# Patient Record
Sex: Female | Born: 1950 | Race: White | Hispanic: No | Marital: Married | Smoking: Former smoker
Health system: Southern US, Community
[De-identification: ages and names within clinical notes are randomized; demographics above are authoritative.]

## PROBLEM LIST (undated history)

## (undated) DIAGNOSIS — E785 Hyperlipidemia, unspecified: Secondary | ICD-10-CM

## (undated) DIAGNOSIS — K219 Gastro-esophageal reflux disease without esophagitis: Secondary | ICD-10-CM

## (undated) DIAGNOSIS — K8020 Calculus of gallbladder without cholecystitis without obstruction: Secondary | ICD-10-CM

## (undated) DIAGNOSIS — T7840XA Allergy, unspecified, initial encounter: Secondary | ICD-10-CM

## (undated) DIAGNOSIS — K269 Duodenal ulcer, unspecified as acute or chronic, without hemorrhage or perforation: Secondary | ICD-10-CM

## (undated) DIAGNOSIS — N200 Calculus of kidney: Secondary | ICD-10-CM

## (undated) DIAGNOSIS — J45909 Unspecified asthma, uncomplicated: Secondary | ICD-10-CM

## (undated) DIAGNOSIS — M8580 Other specified disorders of bone density and structure, unspecified site: Secondary | ICD-10-CM

## (undated) DIAGNOSIS — K589 Irritable bowel syndrome without diarrhea: Secondary | ICD-10-CM

## (undated) DIAGNOSIS — M519 Unspecified thoracic, thoracolumbar and lumbosacral intervertebral disc disorder: Secondary | ICD-10-CM

## (undated) DIAGNOSIS — I10 Essential (primary) hypertension: Secondary | ICD-10-CM

## (undated) DIAGNOSIS — M1990 Unspecified osteoarthritis, unspecified site: Secondary | ICD-10-CM

## (undated) HISTORY — DX: Unspecified osteoarthritis, unspecified site: M19.90

## (undated) HISTORY — PX: BREAST SURGERY: SHX581

## (undated) HISTORY — DX: Gastro-esophageal reflux disease without esophagitis: K21.9

## (undated) HISTORY — DX: Irritable bowel syndrome, unspecified: K58.9

## (undated) HISTORY — DX: Calculus of gallbladder without cholecystitis without obstruction: K80.20

## (undated) HISTORY — DX: Duodenal ulcer, unspecified as acute or chronic, without hemorrhage or perforation: K26.9

## (undated) HISTORY — DX: Calculus of kidney: N20.0

## (undated) HISTORY — PX: TUBAL LIGATION: SHX77

## (undated) HISTORY — PX: UPPER GASTROINTESTINAL ENDOSCOPY: SHX188

## (undated) HISTORY — DX: Other specified disorders of bone density and structure, unspecified site: M85.80

## (undated) HISTORY — DX: Essential (primary) hypertension: I10

## (undated) HISTORY — PX: BREAST CYST ASPIRATION: SHX578

## (undated) HISTORY — DX: Allergy, unspecified, initial encounter: T78.40XA

## (undated) HISTORY — DX: Hyperlipidemia, unspecified: E78.5

## (undated) HISTORY — DX: Unspecified thoracic, thoracolumbar and lumbosacral intervertebral disc disorder: M51.9

## (undated) HISTORY — DX: Unspecified asthma, uncomplicated: J45.909

---

## 1998-01-13 HISTORY — PX: LAPAROSCOPIC CHOLECYSTECTOMY: SUR755

## 1998-11-12 ENCOUNTER — Encounter: Payer: Self-pay | Admitting: Nurse Practitioner

## 1998-11-14 ENCOUNTER — Encounter: Payer: Self-pay | Admitting: Nurse Practitioner

## 2009-10-30 ENCOUNTER — Emergency Department (HOSPITAL_COMMUNITY): Admission: EM | Admit: 2009-10-30 | Discharge: 2009-10-30 | Payer: Self-pay | Admitting: Emergency Medicine

## 2011-03-10 ENCOUNTER — Encounter (HOSPITAL_COMMUNITY): Payer: Self-pay | Admitting: *Deleted

## 2011-03-10 ENCOUNTER — Emergency Department (HOSPITAL_COMMUNITY)
Admission: EM | Admit: 2011-03-10 | Discharge: 2011-03-10 | Disposition: A | Discharge status: Home / Self Care | Payer: PRIVATE HEALTH INSURANCE | Attending: Emergency Medicine | Admitting: Emergency Medicine

## 2011-03-10 DIAGNOSIS — L570 Actinic keratosis: Secondary | ICD-10-CM | POA: Insufficient documentation

## 2011-03-10 NOTE — ED Notes (Signed)
Pt states that she has a skin lesion to scalp for 3 weeks and seems to be spreading per pt, insurance company told pt to be seen at ER

## 2011-03-10 NOTE — ED Notes (Signed)
Alert, NAd, pt has small skin lesion to scalp, present for 2 weeks, [place], and advised by insurance co to go to ER,

## 2011-03-12 NOTE — ED Provider Notes (Signed)
History     CSN: [id number]  Arrival date & time 03/10/11  1112   First MD Initiated Contact with Patient 03/10/11 1339      Chief Complaint  Patient presents with  . Wound Check    (Consider location/radiation/quality/duration/timing/severity/associated sxs/prior treatment) HPI Comments: Patient presents  for evaluation of the lesion that she first noticed on her scalp approximately 3 weeks ago.  Lesion is not painful, itchy or draining, but has increased in size since she first noticed this.  It is slightly raised and scaling.  She is otherwise without complaints.  She is visiting from [place], but maybe here permanently to care for an invalid relative.  She wants reassurance that the lesion is not a skin cancer, as she has had significant sun exposure through the years.  She is otherwise without complaint today.  She has tried no medications for this lesion.  She has no significant past medical history and is not currently on any medications.  The history is provided by the patient.    History reviewed. No pertinent past medical history.  Past Surgical History  Procedure Date  . Cesarean section   . Laparoscopic cholecystectomy     History reviewed. No pertinent family history.  History  Substance Use Topics  . Smoking status: Former Games developer  . Smokeless tobacco: Not on file  . Alcohol Use: No    OB History    Grav Para Term Preterm Abortions TAB SAB Ect Mult Living                  Review of Systems  Constitutional: Negative for fever.  HENT: Negative for congestion, sore throat and neck pain.   Eyes: Negative.   Respiratory: Negative for chest tightness and shortness of breath.   Cardiovascular: Negative for chest pain.  Gastrointestinal: Negative for nausea and abdominal pain.  Genitourinary: Negative.   Musculoskeletal: Negative for joint swelling and arthralgias.  Skin: Negative for color change, rash and wound.  Neurological: Negative for dizziness,  weakness, light-headedness, numbness and headaches.  Hematological: Negative.   Psychiatric/Behavioral: Negative.     Allergies  Review of patient's allergies indicates no known allergies.  Home Medications  No current outpatient prescriptions on file.  BP 108/74  Pulse 68  Temp(Src) 97.8 F (36.6 C) (Oral)  Resp 18  Ht 5\' 1"  (1.549 m)  Wt 120 lb (54.432 kg)  BMI 22.67 kg/m2  SpO2 100%  Physical Exam  Nursing note and vitals reviewed. Constitutional: She is oriented to person, place, and time. She appears well-developed and well-nourished.  HENT:  Head: Normocephalic and atraumatic.  Eyes: Conjunctivae are normal.  Neck: Normal range of motion.  Cardiovascular: Normal rate.   Pulmonary/Chest: Effort normal.  Musculoskeletal: Normal range of motion.  Lymphadenopathy:       Head (right side): No submandibular, no preauricular, no posterior auricular and no occipital adenopathy present.       Head (left side): No submandibular, no preauricular, no posterior auricular and no occipital adenopathy present.    She has no cervical adenopathy.  Neurological: She is alert and oriented to person, place, and time.  Skin: Skin is warm and dry.       1 cm plaque like lesion right parietal scalp, slightly raised in skin color with uniformity of color, scaly, no surrounding erythema, and no drainage and fairly regular lesion margins.  Psychiatric: She has a normal mood and affect.    ED Course  Procedures (including critical care time)  Labs Reviewed - No data to display No results found.   1. Actinic keratosis       MDM  Lesion most consistent with actinic keratosis.  However, patient advised that this is a condition that can transition to a skin cancer and she does need to have this biopsied and/or definitively treated with excision.  She has been referred to Dr. Margo Aye home she will call today to set up an appointment.        Candis Musa, PA 03/12/11 1113  Candis Musa, PA 03/12/11 1114

## 2011-03-14 NOTE — ED Provider Notes (Signed)
Medical screening examination/treatment/procedure(s) were performed by non-physician practitioner and as supervising physician I was immediately available for consultation/collaboration.  Nicoletta Dress. Colon Branch, MD 03/14/11 [phone]

## 2011-07-14 HISTORY — PX: SKIN LESION EXCISION: SHX2412

## 2011-12-25 ENCOUNTER — Emergency Department (HOSPITAL_COMMUNITY)
Admission: EM | Admit: 2011-12-25 | Discharge: 2011-12-25 | Disposition: A | Discharge status: Home / Self Care | Attending: Emergency Medicine | Admitting: Emergency Medicine

## 2011-12-25 ENCOUNTER — Encounter (HOSPITAL_COMMUNITY): Payer: Self-pay | Admitting: Emergency Medicine

## 2011-12-25 DIAGNOSIS — N63 Unspecified lump in unspecified breast: Secondary | ICD-10-CM | POA: Insufficient documentation

## 2011-12-25 DIAGNOSIS — R928 Other abnormal and inconclusive findings on diagnostic imaging of breast: Secondary | ICD-10-CM | POA: Insufficient documentation

## 2011-12-25 DIAGNOSIS — Z87891 Personal history of nicotine dependence: Secondary | ICD-10-CM | POA: Insufficient documentation

## 2011-12-25 NOTE — ED Notes (Signed)
Pt states noticed lump in right breast one week ago. Pt states slightly painful and hard.

## 2011-12-28 NOTE — ED Provider Notes (Signed)
History     CSN: [id number]  Arrival date & time 12/25/11  1308   First MD Initiated Contact with Patient 12/25/11 1513      Chief Complaint  Patient presents with  . Breast Mass    (Consider location/radiation/quality/duration/timing/severity/associated sxs/prior treatment) HPI Comments: Denise Benton presents for evaluation of a breast mass she noticed about 1 week ago. She reports having a history of cystic breast masses in the past, with several episodes requiring needle aspiration.  Her pcp is in [place], where they spend the summers,  Here for the winter months.  After speaking with her pcp,  She was advised to obtain an ultrasound given her past history.  The nodule is tender to palpation but is otherwise asymptomatic.    The history is provided by the patient.    History reviewed. No pertinent past medical history.  Past Surgical History  Procedure Date  . Cesarean section   . Laparoscopic cholecystectomy   . Breast surgery     pt has cysts biopsy done for lumps    History reviewed. No pertinent family history.  History  Substance Use Topics  . Smoking status: Former Games developer  . Smokeless tobacco: Not on file  . Alcohol Use: No    OB History    Grav Para Term Preterm Abortions TAB SAB Ect Mult Living                  Review of Systems  Constitutional: Negative for fever and chills.  HENT: Negative for congestion, sore throat and neck pain.   Eyes: Negative.   Respiratory: Negative for chest tightness and shortness of breath.   Cardiovascular: Negative for chest pain.  Gastrointestinal: Negative for nausea and abdominal pain.  Genitourinary: Negative.   Musculoskeletal: Negative for joint swelling and arthralgias.  Skin: Negative.  Negative for color change, rash and wound.  Neurological: Negative for dizziness, weakness, light-headedness, numbness and headaches.  Hematological: Negative.   Psychiatric/Behavioral: Negative.     Allergies   Codeine; Ivp dye; and Niaspan  Home Medications  No current outpatient prescriptions on file.  BP 113/68  Pulse 60  Temp 98 F (36.7 C) (Oral)  Resp 20  Ht 5\' 1"  (1.549 m)  Wt 119 lb (53.978 kg)  BMI 22.48 kg/m2  SpO2 100%  Physical Exam  Nursing note and vitals reviewed. Constitutional: She appears well-developed and well-nourished.  HENT:  Head: Normocephalic and atraumatic.  Eyes: Conjunctivae normal are normal.  Neck: Normal range of motion.  Cardiovascular: Normal rate, regular rhythm, normal heart sounds and intact distal pulses.   Pulmonary/Chest: Effort normal and breath sounds normal. She has no wheezes.       Small, firm deep, moveable nodule at 3 oclock position.  No breast asymmetry, no nipple drainage, no skin abnormalities.  No axillary adenopathy.  Abdominal: Soft. Bowel sounds are normal. There is no tenderness.  Musculoskeletal: Normal range of motion.  Neurological: She is alert.  Skin: Skin is warm and dry. No rash noted. No erythema.  Psychiatric: She has a normal mood and affect.    ED Course  Procedures (including critical care time)  Labs Reviewed - No data to display No results found.   1. Mass   2. Breast nodule       MDM  Outpatient mammogram with diagnostic US arranged  In 6 days, at which time she will have consult with interventional radiologist on site.  Further management per results of this study.  Burgess Amor, [place] 12/28/11 [phone]

## 2011-12-28 NOTE — ED Provider Notes (Signed)
Medical screening examination/treatment/procedure(s) were performed by non-physician practitioner and as supervising physician I was immediately available for consultation/collaboration. Devoria Albe, MD, FACEP   Ward Givens, MD 12/28/11 [phone]

## 2011-12-31 ENCOUNTER — Encounter (HOSPITAL_COMMUNITY): Payer: PRIVATE HEALTH INSURANCE

## 2012-01-01 ENCOUNTER — Ambulatory Visit (HOSPITAL_COMMUNITY)
Admission: RE | Admit: 2012-01-01 | Discharge: 2012-01-01 | Disposition: A | Discharge status: Home / Self Care | Payer: PRIVATE HEALTH INSURANCE | Source: Ambulatory Visit | Attending: Emergency Medicine | Admitting: Emergency Medicine

## 2012-01-01 ENCOUNTER — Other Ambulatory Visit (HOSPITAL_COMMUNITY): Payer: Self-pay | Admitting: Emergency Medicine

## 2012-01-01 DIAGNOSIS — N63 Unspecified lump in unspecified breast: Secondary | ICD-10-CM | POA: Insufficient documentation

## 2012-01-01 DIAGNOSIS — N644 Mastodynia: Secondary | ICD-10-CM | POA: Insufficient documentation

## 2019-01-27 ENCOUNTER — Other Ambulatory Visit: Payer: Self-pay

## 2019-01-28 ENCOUNTER — Telehealth: Payer: Self-pay | Admitting: Family Medicine

## 2019-01-28 ENCOUNTER — Ambulatory Visit (INDEPENDENT_AMBULATORY_CARE_PROVIDER_SITE_OTHER): Payer: Medicare Other

## 2019-01-28 ENCOUNTER — Ambulatory Visit (INDEPENDENT_AMBULATORY_CARE_PROVIDER_SITE_OTHER): Payer: Medicare Other | Admitting: Family Medicine

## 2019-01-28 ENCOUNTER — Encounter: Payer: Self-pay | Admitting: Family Medicine

## 2019-01-28 VITALS — BP 118/70 | HR 56 | Temp 96.8°F | Resp 20 | Ht 61.0 in | Wt 112.0 lb

## 2019-01-28 DIAGNOSIS — Z0000 Encounter for general adult medical examination without abnormal findings: Secondary | ICD-10-CM

## 2019-01-28 DIAGNOSIS — J4520 Mild intermittent asthma, uncomplicated: Secondary | ICD-10-CM

## 2019-01-28 DIAGNOSIS — Z780 Asymptomatic menopausal state: Secondary | ICD-10-CM

## 2019-01-28 DIAGNOSIS — M8580 Other specified disorders of bone density and structure, unspecified site: Secondary | ICD-10-CM

## 2019-01-28 DIAGNOSIS — K219 Gastro-esophageal reflux disease without esophagitis: Secondary | ICD-10-CM | POA: Diagnosis not present

## 2019-01-28 NOTE — Patient Instructions (Addendum)
Check on tetanus administration date.    Food Choices for Gastroesophageal Reflux Disease, Adult When you have gastroesophageal reflux disease (GERD), the foods you eat and your eating habits are very important. Choosing the right foods can help ease your discomfort. Think about working with a nutrition specialist (dietitian) to help you make good choices. What are tips for following this plan?  Meals  Choose healthy foods that are low in fat, such as fruits, vegetables, whole grains, low-fat dairy products, and lean meat, fish, and poultry.  Eat small meals often instead of 3 large meals a day. Eat your meals slowly, and in a place where you are relaxed. Avoid bending over or lying down until 2-3 hours after eating.  Avoid eating meals 2-3 hours before bed.  Avoid drinking a lot of liquid with meals.  Cook foods using methods other than frying. Bake, grill, or broil food instead.  Avoid or limit: ? Chocolate. ? Peppermint or spearmint. ? Alcohol. ? Pepper. ? Black and decaffeinated coffee. ? Black and decaffeinated tea. ? Bubbly (carbonated) soft drinks. ? Caffeinated energy drinks and soft drinks.  Limit high-fat foods such as: ? Fatty meat or fried foods. ? Whole milk, cream, butter, or ice cream. ? Nuts and nut butters. ? Pastries, donuts, and sweets made with butter or shortening.  Avoid foods that cause symptoms. These foods may be different for everyone. Common foods that cause symptoms include: ? Tomatoes. ? Oranges, lemons, and limes. ? Peppers. ? Spicy food. ? Onions and garlic. ? Vinegar. Lifestyle  Maintain a healthy weight. Ask your doctor what weight is healthy for you. If you need to lose weight, work with your doctor to do so safely.  Exercise for at least 30 minutes for 5 or more days each week, or as told by your doctor.  Wear loose-fitting clothes.  Do not smoke. If you need help quitting, ask your doctor.  Sleep with the head of your bed higher  than your feet. Use a wedge under the mattress or blocks under the bed frame to raise the head of the bed. Summary  When you have gastroesophageal reflux disease (GERD), food and lifestyle choices are very important in easing your symptoms.  Eat small meals often instead of 3 large meals a day. Eat your meals slowly, and in a place where you are relaxed.  Limit high-fat foods such as fatty meat or fried foods.  Avoid bending over or lying down until 2-3 hours after eating.  Avoid peppermint and spearmint, caffeine, alcohol, and chocolate. This information is not intended to replace advice given to you by your health care provider. Make sure you discuss any questions you have with your health care provider. Document Revised: 04/22/2018 Document Reviewed: 02/05/2016 Elsevier Patient Education  2020 [employer].

## 2019-01-28 NOTE — Telephone Encounter (Signed)
Patient returning missed call from [facility] regarding Dexa Scan results.

## 2019-01-28 NOTE — Progress Notes (Signed)
New Patient Office Visit  Assessment & Plan:  1. Mild intermittent asthma without complication - Well controlled on current regimen. Uses Advair during her season of increased exacerbations to prevent them.  2. Gastroesophageal reflux disease, unspecified whether esophagitis present - Patient is going to decrease Prilosec from 40 mg to 20 mg once daily after she returns home from [place] where she is going next week to help her mother. She will then try to switch over to a H2 blocker in order to get off the PPI. She does not wish to be on this long term. Education provided on appropriate food choices with GERD. Chocolate is her weakness when it comes to this.   3-4. Postmenopausal estrogen deficiency/Osteopenia, unspecified location - Patient is taking calcium and vitamin D supplements.  - DG [facility] DEXA  5. Healthcare maintenance - Patient believes she is UTD with pap smear, mammogram, colonoscopy, and TDAP - all of these should be in her records. Hopefully hepatitis C screening is in these records as well.    Follow-up: Return in about 6 months (around 07/28/2019) for follow-up of chronic medication conditions.   [name], MSN, APRN, FNP-C [name]  Subjective:  Patient ID: Denise Benton, female    DOB: 1950-10-12  Age: 69 y.o. MRN: [id number]  Patient Care Team: Loman Brooklyn, FNP as PCP - General (Family Medicine)  CC:  Chief Complaint  Patient presents with  . Establish Care    HPI Denise Benton presents to establish care. She is transferring from [place] due to a recent move to be closer to her husband's elderly parents.   Asthma: patient does not require use of Albuterol inhaler unless she is exercising an asthma exacerbation which is not very often.   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 She was initially started on Prilosec 40 mg BID which she took for a month before she was able to sleep through the night. She is trying to wean herself off of it as she knows how difficult this can be. She is currently taking Prilosec 40 mg in the morning and Mylanta at night. She has never tried any of the H2 blockers. She lost ~10 lbs when she was first started on the Prilosec. She feels she is losing muscle mass since being on it as well.   Patient reports having an annual physical with labs in October 2020 before leaving [place].    Review of Systems  Constitutional: Negative for chills, fever, malaise/fatigue and weight loss.  HENT: Negative for congestion, ear discharge, ear pain, nosebleeds, sinus pain, sore throat and tinnitus.   Eyes: Negative for blurred vision, double vision, pain, discharge and redness.  Respiratory: Negative for cough, shortness of breath and wheezing.   Cardiovascular: Negative for chest pain, palpitations and leg swelling.  Gastrointestinal: Positive for heartburn. Negative for abdominal pain, constipation, diarrhea, nausea and vomiting.  Genitourinary: Negative for dysuria, frequency and urgency.  Musculoskeletal: Negative for myalgias.  Skin: Negative for rash.  Neurological: Negative for dizziness, seizures, weakness and headaches.  Psychiatric/Behavioral: Negative for depression, substance abuse and suicidal ideas. The patient is not nervous/anxious.     Current Outpatient Medications:  .  albuterol (VENTOLIN HFA) 108 (90 Base) MCG/ACT inhaler, Inhale into the lungs., Disp: , Rfl:  .  aspirin EC 81 MG tablet, Take 81  mg by mouth once a week., Disp: , Rfl:  .  B Complex Vitamins (VITAMIN-B COMPLEX PO), Take 1 tablet by mouth daily., Disp: , Rfl:  .  Biotin 67619 MCG TABS, Take by mouth daily., Disp: , Rfl:  .  calcium carbonate (CALTRATE 600) 1500 (600 Ca) MG TABS tablet, Take by mouth 3 (three) times a week., Disp: , Rfl:    .  Cholecalciferol (VITAMIN D) 50 MCG (2000 UT) CAPS, Take by mouth 3 (three) times a week., Disp: , Rfl:  .  fluticasone-salmeterol (ADVAIR HFA) 115-21 MCG/ACT inhaler, , Disp: , Rfl:  .  folic acid (FOLVITE) 800 MCG tablet, Take 400 mcg by mouth 3 (three) times a week., Disp: , Rfl:  .  Multiple Vitamin (MULTIVITAMIN ADULT PO), Take by mouth., Disp: , Rfl:  .  omeprazole (PRILOSEC) 40 MG capsule, Take 1 capsule by mouth daily., Disp: , Rfl:   Allergies  Allergen Reactions  . Codeine   . Ivp Dye [Iodinated Diagnostic Agents]   . Niaspan Odette Fraction Er]     Past Medical History:  Diagnosis Date  . Allergy   . Asthma   . GERD (gastroesophageal reflux disease)   . Osteopenia     Past Surgical History:  Procedure Laterality Date  . BREAST SURGERY     pt has cysts aspiration done for lumps  . CESAREAN SECTION    . LAPAROSCOPIC CHOLECYSTECTOMY      Family History  Problem Relation Age of Onset  . Tremor Mother   . Hyperlipidemia Mother   . Skin cancer Mother   . Congestive Heart Failure Father   . Diabetes Father   . Multiple sclerosis Sister   . Heart disease Sister   . Tremor Sister     Social History   Socioeconomic History  . Marital status: Married    Spouse name: Not on file  . Number of children: 2  . Years of education: Not on file  . Highest education level: Not on file  Occupational History  . Not on file  Tobacco Use  . Smoking status: Former Games developer  . Smokeless tobacco: Never Used  Substance and Sexual Activity  . Alcohol use: No  . Drug use: No  . Sexual activity: Not on file  Other Topics Concern  . Not on file  Social History Narrative  . Not on file   Social Determinants of Health   Financial Resource Strain:   . Difficulty of Paying Living Expenses: Not on file  Food Insecurity:   . Worried About Programme researcher, broadcasting/film/video in the Last Year: Not on file  . Ran Out of Food in the Last Year: Not on file  Transportation Needs:   . Lack of  Transportation (Medical): Not on file  . Lack of Transportation (Non-Medical): Not on file  Physical Activity:   . Days of Exercise per Week: Not on file  . Minutes of Exercise per Session: Not on file  Stress:   . Feeling of Stress : Not on file  Social Connections:   . Frequency of Communication with Friends and Family: Not on file  . Frequency of Social Gatherings with Friends and Family: Not on file  . Attends Religious Services: Not on file  . Active Member of Clubs or Organizations: Not on file  . Attends Banker Meetings: Not on file  . Marital Status: Not on file  Intimate Partner Violence:   . Fear of Current or Ex-Partner: Not on file  .  Emotionally Abused: Not on file  . Physically Abused: Not on file  . Sexually Abused: Not on file    Objective:   Today's Vitals: BP 118/70   Pulse (!) 56   Temp (!) 96.8 F (36 C)   Resp 20   Ht 5\' 1"  (1.549 m)   Wt 112 lb (50.8 kg)   SpO2 100%   BMI 21.16 kg/m   Physical Exam Vitals reviewed.  Constitutional:      General: She is not in acute distress.    Appearance: Normal appearance. She is normal weight. She is not ill-appearing, toxic-appearing or diaphoretic.  HENT:     Head: Normocephalic and atraumatic.  Eyes:     General: No scleral icterus.       Right eye: No discharge.        Left eye: No discharge.     Conjunctiva/sclera: Conjunctivae normal.  Cardiovascular:     Rate and Rhythm: Normal rate and regular rhythm.     Heart sounds: Normal heart sounds. No murmur. No friction rub. No gallop.   Pulmonary:     Effort: Pulmonary effort is normal. No respiratory distress.     Breath sounds: Normal breath sounds. No stridor. No wheezing, rhonchi or rales.  Musculoskeletal:        General: Normal range of motion.     Cervical back: Normal range of motion.  Skin:    General: Skin is warm and dry.     Capillary Refill: Capillary refill takes less than 2 seconds.  Neurological:     General: No focal  deficit present.     Mental Status: She is alert and oriented to person, place, and time. Mental status is at baseline.  Psychiatric:        Mood and Affect: Mood normal.        Behavior: Behavior normal.        Thought Content: Thought content normal.        Judgment: Judgment normal.

## 2019-01-29 ENCOUNTER — Encounter: Payer: Self-pay | Admitting: Family Medicine

## 2019-01-29 DIAGNOSIS — M8580 Other specified disorders of bone density and structure, unspecified site: Secondary | ICD-10-CM | POA: Insufficient documentation

## 2019-01-29 DIAGNOSIS — K219 Gastro-esophageal reflux disease without esophagitis: Secondary | ICD-10-CM | POA: Insufficient documentation

## 2019-01-29 DIAGNOSIS — J45909 Unspecified asthma, uncomplicated: Secondary | ICD-10-CM | POA: Insufficient documentation

## 2019-01-31 NOTE — Progress Notes (Signed)
Patient states that she will bring in a copy of mammogram and colonoscopy.  Aware that she needs TDAP and patient will follow up on this.

## 2019-02-01 NOTE — Telephone Encounter (Signed)
Reviewed DEXA with pt per provider notes.

## 2019-02-25 ENCOUNTER — Ambulatory Visit: Payer: PRIVATE HEALTH INSURANCE | Attending: Internal Medicine

## 2019-02-25 ENCOUNTER — Other Ambulatory Visit: Payer: Self-pay

## 2019-02-25 DIAGNOSIS — Z20822 Contact with and (suspected) exposure to covid-19: Secondary | ICD-10-CM

## 2019-02-26 LAB — NOVEL CORONAVIRUS, NAA: SARS-CoV-2, NAA: NOT DETECTED

## 2019-03-14 ENCOUNTER — Other Ambulatory Visit: Payer: Self-pay

## 2019-03-15 ENCOUNTER — Encounter: Payer: Self-pay | Admitting: Nurse Practitioner

## 2019-03-15 ENCOUNTER — Encounter: Payer: Self-pay | Admitting: Family Medicine

## 2019-03-15 ENCOUNTER — Ambulatory Visit (INDEPENDENT_AMBULATORY_CARE_PROVIDER_SITE_OTHER): Payer: Medicare Other | Admitting: Family Medicine

## 2019-03-15 VITALS — BP 110/66 | HR 69 | Temp 97.8°F | Ht 61.0 in | Wt 113.2 lb

## 2019-03-15 DIAGNOSIS — R350 Frequency of micturition: Secondary | ICD-10-CM | POA: Diagnosis not present

## 2019-03-15 DIAGNOSIS — K219 Gastro-esophageal reflux disease without esophagitis: Secondary | ICD-10-CM

## 2019-03-15 DIAGNOSIS — R8290 Unspecified abnormal findings in urine: Secondary | ICD-10-CM | POA: Diagnosis not present

## 2019-03-15 DIAGNOSIS — N200 Calculus of kidney: Secondary | ICD-10-CM | POA: Diagnosis not present

## 2019-03-15 LAB — URINALYSIS
Bilirubin, UA: NEGATIVE
Glucose, UA: NEGATIVE
Ketones, UA: NEGATIVE
Leukocytes,UA: NEGATIVE
Nitrite, UA: NEGATIVE
Protein,UA: NEGATIVE
Specific Gravity, UA: 1.025 (ref 1.005–1.030)
Urobilinogen, Ur: 0.2 mg/dL (ref 0.2–1.0)
pH, UA: 7 (ref 5.0–7.5)

## 2019-03-15 NOTE — Patient Instructions (Addendum)
Famotidine for reflux.   Kidney Stones Kidney stones are rock-like masses that form inside of the kidneys. Kidneys are organs that make pee (urine). A kidney stone may move into other parts of the urinary tract, including:  The tubes that connect the kidneys to the bladder (ureters).  The bladder.  The tube that carries urine out of the body (urethra). Kidney stones can cause very bad pain and can block the flow of pee. The stone usually leaves your body (passes) through your pee. You may need to have a doctor take out the stone. What are the causes? Kidney stones may be caused by:  A condition in which certain glands make too much parathyroid hormone (primary hyperparathyroidism).  A buildup of a type of crystals in the bladder made of a chemical called uric acid. The body makes uric acid when you eat certain foods.  Narrowing (stricture) of one or both of the ureters.  A kidney blockage that you were born with.  Past surgery on the kidney or the ureters, such as gastric bypass surgery. What increases the risk? You are more likely to develop this condition if:  You have had a kidney stone in the past.  You have a family history of kidney stones.  You do not drink enough water.  You eat a diet that is high in protein, salt (sodium), or sugar.  You are overweight or very overweight (obese). What are the signs or symptoms? Symptoms of a kidney stone may include:  Pain in the side of the belly, right below the ribs (flank pain). Pain usually spreads (radiates) to the groin.  Needing to pee often or right away (urgently).  Pain when going pee (urinating).  Blood in your pee (hematuria).  Feeling like you may vomit (nauseous).  Vomiting.  Fever and chills. How is this treated? Treatment depends on the size, location, and makeup of the kidney stones. The stones will often pass out of the body through peeing. You may need to:  Drink more fluid to help pass the stone. In  some cases, you may be given fluids through an IV tube put into one of your veins at the hospital.  Take medicine for pain.  Make changes in your diet to help keep kidney stones from coming back. Sometimes, medical procedures are needed to remove a kidney stone. This may involve:  A procedure to break up kidney stones using a beam of light (laser) or shock waves.  Surgery to remove the kidney stones. Follow these instructions at home: Medicines  Take over-the-counter and prescription medicines only as told by your doctor.  Ask your doctor if the medicine prescribed to you requires you to avoid driving or using heavy machinery. Eating and drinking  Drink enough fluid to keep your pee pale yellow. You may be told to drink at least 8-10 glasses of water each day. This will help you pass the stone.  If told by your doctor, change your diet. This may include: ? Limiting how much salt you eat. ? Eating more fruits and vegetables. ? Limiting how much meat, poultry, fish, and eggs you eat.  Follow instructions from your doctor about eating or drinking restrictions. General instructions  Collect pee samples as told by your doctor. You may need to collect a pee sample: ? 24 hours after a stone comes out. ? 8-12 weeks after a stone comes out, and every 6-12 months after that.  Strain your pee every time you pee (urinate), for as  long as told. Use the strainer that your doctor recommends.  Do not throw out the stone. Keep it so that it can be tested by your doctor.  Keep all follow-up visits as told by your doctor. This is important. You may need follow-up tests. How is this prevented? To prevent another kidney stone:  Drink enough fluid to keep your pee pale yellow. This is the best way to prevent kidney stones.  Eat healthy foods.  Avoid certain foods as told by your doctor. You may be told to eat less protein.  Stay at a healthy weight. Where to find more information  [place] ([facility]): www.kidney.[facility]): www.urologyhealth.org Contact a doctor if:  You have pain that gets worse or does not get better with medicine. Get help right away if:  You have a fever or chills.  You get very bad pain.  You get new pain in your belly (abdomen).  You pass out (faint).  You cannot pee. Summary  Kidney stones are rock-like masses that form inside of the kidneys.  Kidney stones can cause very bad pain and can block the flow of pee.  The stones will often pass out of the body through peeing.  Drink enough fluid to keep your pee pale yellow. This information is not intended to replace advice given to you by your health care provider. Make sure you discuss any questions you have with your health care provider. Document Revised: 05/18/2018 Document Reviewed: 05/18/2018 Elsevier Patient Education  [place].

## 2019-03-15 NOTE — Progress Notes (Signed)
Assessment & Plan:  1. Gastroesophageal reflux disease, unspecified whether esophagitis present - Encouraged patient to try H2 blocker while waiting on appointment with GI since she does not wish to be on PPI.  - Ambulatory referral to Gastroenterology  2. Kidney stone - Patient desires no treatment as she knows it is small and will pass. She only wanted to make sure she didn't have an infection.   3. Frequent urination - Urinalysis - Urine dipstick shows positive for RBC's.  Micro exam: not done as there was not enough urine.  4. Abnormal urinalysis - Urine Culture   Follow up plan: Return if symptoms worsen or fail to improve.  [name], MSN, APRN, FNP-C [facility]  Subjective:   Patient ID: Denise Benton, female    DOB: [birth year], 69 y.o.   MRN: [id number]  HPI: Denise Benton is a 69 y.o. female presenting on 03/15/2019 for Urinary Frequency (Patient states it has been going on a few weeks on and off )  Patient c/o urinary frequency on and off for the past few weeks. She does report she knows she has a small kidney stone. She tries to drink water to help flush it through but states she is often unable to drink much because she stays so sick to her stomach. She is avoiding tea and fruit juices. She has stopped her calcium supplement. She has been drinking Pedialyte. She denies any pain.   Feeling sick to her stomach causes more concern that her urinary frequency. She was started on omeprazole 40 mg BID which caused diarrhea. She has been weaning herself off of it over the past couple of months and has had a hard time due to the rebound acid reflux. She is now taking Mylanta twice daily. She reports she tastes bile every morning when she wakes up. We discussed this on 01/28/2019 when she established care with me and I recommended one of the H2 blockers, which she has not yet tried. She has seen specialists in [place] before moving down here and  reports it was recommended she have an EGD. Also, she states the pulmonologist felt the bile was getting into her lungs. She feels whatever is going on is causing her to feel weak and have dizzy spells.      ROS: Negative unless specifically indicated above in HPI.   Relevant past medical history reviewed and updated as indicated.   Allergies and medications reviewed and updated.   Current Outpatient Medications:  .  albuterol (VENTOLIN HFA) 108 (90 Base) MCG/ACT inhaler, Inhale into the lungs., Disp: , Rfl:  .  Alum & Mag Hydroxide-Simeth (MYLANTA PO), Take by mouth., Disp: , Rfl:  .  aspirin EC 81 MG tablet, Take 81 mg by mouth once a week., Disp: , Rfl:  .  B Complex Vitamins (VITAMIN-B COMPLEX PO), Take 1 tablet by mouth daily., Disp: , Rfl:  .  Biotin 85462 MCG TABS, Take by mouth daily., Disp: , Rfl:  .  Cholecalciferol (VITAMIN D) 50 MCG (2000 UT) CAPS, Take by mouth 3 (three) times a week., Disp: , Rfl:  .  fluticasone-salmeterol (ADVAIR HFA) 115-21 MCG/ACT inhaler, , Disp: , Rfl:  .  folic acid (FOLVITE) 800 MCG tablet, Take 400 mcg by mouth 3 (three) times a week., Disp: , Rfl:  .  Multiple Vitamin (MULTIVITAMIN ADULT PO), Take by mouth., Disp: , Rfl:   Allergies  Allergen Reactions  . Ivp Dye [Iodinated Diagnostic Agents] Other (See  Comments)    Respiratory distress  . Codeine Nausea Only  . Niaspan [Niacin Er] Rash and Other (See Comments)    Nightmares    Objective:   BP 110/66   Pulse 69   Temp 97.8 F (36.6 C) (Temporal)   Ht 5\' 1"  (1.549 m)   Wt 113 lb 3.2 oz (51.3 kg)   SpO2 100%   BMI 21.39 kg/m    Physical Exam Vitals reviewed.  Constitutional:      General: She is not in acute distress.    Appearance: Normal appearance. She is normal weight. She is not ill-appearing, toxic-appearing or diaphoretic.  HENT:     Head: Normocephalic and atraumatic.  Eyes:     General: No scleral icterus.       Right eye: No discharge.        Left eye: No discharge.       Conjunctiva/sclera: Conjunctivae normal.  Cardiovascular:     Rate and Rhythm: Normal rate.  Pulmonary:     Effort: Pulmonary effort is normal. No respiratory distress.  Musculoskeletal:        General: Normal range of motion.     Cervical back: Normal range of motion.  Skin:    General: Skin is warm and dry.     Capillary Refill: Capillary refill takes less than 2 seconds.  Neurological:     General: No focal deficit present.     Mental Status: She is alert and oriented to person, place, and time. Mental status is at baseline.  Psychiatric:        Mood and Affect: Mood normal.        Behavior: Behavior normal.        Thought Content: Thought content normal.        Judgment: Judgment normal.

## 2019-03-17 ENCOUNTER — Encounter: Payer: Self-pay | Admitting: Family Medicine

## 2019-03-17 LAB — URINE CULTURE

## 2019-03-21 NOTE — Progress Notes (Signed)
03/21/2019 Denise Benton [id number] 1950/03/02   CHIEF COMPLAINT: Acid reflux  HISTORY OF PRESENT ILLNESS:  Denise Benton is a 69 year old female with a past medical history of asthma, kidney stones, osteopenia and GERD. S/P cholecystectomy in 11/13/1998 which required an exploratory laparoscopy with the placement of a 10 [place] drain consistent with a leak from a small biliary duct in the gallbladder bed, consistent with a duct of L[place]chka.  She underwent an ERCP with the placement of a biliary stent 11/14/1998. She underwent an EGD with stent removal 11/21/2018. The EGD showed duodenal ulcers and one small erosion in the antrum most likely due to NSAIDs she took for post operative pain. She was prescribed Prilosec 20mg  daily for a few weeks.    She was referred to out office by NP for further evaluation for GERD. She questions if her acid reflux possible started back in 1999 when she developed bronchitis with asthma like symptoms since then. She was evaluated by a pulmonologist in 2020 who diagnosed her with asthma. She is using Advair inhaler daily and Albuterol inhaler as needed with some improvement. She complains of having acid reflux, bile backing up in her throat. No dysphagia. No upper abdominal pain. She took Omeprazole 40mg  po bid which resulted in diarrhea. She took Mylanta 1 to 4 times daily with some improvement. She is now taking Famotidine 20mg  po bid for the past week. She was evaluated by ENT, a laryngoscopy showed evidence of LPR and she was advised to schedule a GI consult.  She denies ever having an EGD. She is passing a normal brown formed stool most days. No rectal bleeding or black stools. No lower abdominal pain. She underwent a colonoscopy at [facility] in 2012 which she reported was normal and she was advised to repeat a colonoscopy in 10 years. She had a colonoscopy in 2002 due to having rectal bleeding which she reported showed internal hemorrhoids.  No weight loss. Normal weigh 115 - 119 lbs. No family history of esophageal, gastric or colon cancer.    Abd/pelvic CT WO contrast 07/02/2018: Small right liver cyst 50mm in diameter. Right liver granuloma formation. S/P cholecystectomy.  Tiny nonobstructing calculs mid upper left kidney.   Abdominal sonogram 06/02/2018: Right hepatic lobe consistent with cyst measuring 2.1 x 2 x 1.5 cm. Status post cholecystectomy.  No biliary dilatation with common bile duct diameter 2.8 mm. Mild left pelvicaliceal dilatation consistent with hydronephrosis.   Small echogenic, shadowing nonobstructing calculi in the lower pole region are suspected   Colonoscopy 10/25/2010 at [facility]: Internal hemorrhoids otherwise normal 10 year recall  11/21/2016 Stress ECHO: No echocardiographic evidence for ischemia at a diagnostic level of stress. Excellent exercise tolerance for age and gender.  Past Medical History:  Diagnosis Date  . Allergy   . Asthma   . GERD (gastroesophageal reflux disease)   . Lumbosacral disc disease    L4-5, L5-S1  . Osteopenia    Past Surgical History:  Procedure Laterality Date  . BREAST SURGERY     pt has cysts aspiration done for lumps  . CESAREAN SECTION  1980   x2; again in 1984  . LAPAROSCOPIC CHOLECYSTECTOMY  2000  . SKIN LESION EXCISION  07/2011   from scalp - keratosis    reports that she has quit smoking. She has never used smokeless tobacco. She reports that she does not drink alcohol or use drugs. family history includes Congestive Heart Failure  in her father; Diabetes in her father; Heart disease in her sister; Hyperlipidemia in her mother; Multiple sclerosis in her sister; Skin cancer in her mother; Tremor in her mother and sister. Allergies  Allergen Reactions  . Ivp Dye [Iodinated Diagnostic Agents] Other (See Comments)    Respiratory distress  . Codeine Nausea Only  . Niaspan [Niacin Er] Rash and Other (See Comments)     Nightmares      Outpatient Encounter Medications as of 03/22/2019  Medication Sig  . albuterol (VENTOLIN HFA) 108 (90 Base) MCG/ACT inhaler Inhale into the lungs.  . Alum & Mag Hydroxide-Simeth (MYLANTA PO) Take by mouth.  Marland Kitchen aspirin EC 81 MG tablet Take 81 mg by mouth once a week.  . B Complex Vitamins (VITAMIN-B COMPLEX PO) Take 1 tablet by mouth daily.  . Biotin 10000 MCG TABS Take by mouth daily.  . Cholecalciferol (VITAMIN D) 50 MCG (2000 UT) CAPS Take by mouth 3 (three) times a week.  . fluticasone-salmeterol (ADVAIR HFA) 115-21 MCG/ACT inhaler   . folic acid (FOLVITE) 998 MCG tablet Take 400 mcg by mouth 3 (three) times a week.  . Multiple Vitamin (MULTIVITAMIN ADULT PO) Take by mouth.   No facility-administered encounter medications on file as of 03/22/2019.     REVIEW OF SYSTEMS: All other systems reviewed and negative except where noted in the History of Present Illness.   PHYSICAL EXAM BP 110/64 (BP Location: Left Arm, Patient Position: Sitting, Cuff Size: Normal)   Pulse 64   Temp 97.9 F (36.6 C)   Ht 5' (1.524 m) Comment: height measured without shoes  Wt 116 lb 6 oz (52.8 kg)   BMI 22.73 kg/m   General: Well developed 69 year old year old female in no acute distress. Head: Normocephalic and atraumatic. Eyes:  Sclerae non-icteric, conjunctive pink. Ears: Normal auditory acuity. Mouth: Dentition intact. No ulcers or lesions.  Neck: Supple, no lymphadenopathy or thyromegaly.  Lungs: Clear bilaterally to auscultation without wheezes, crackles or rhonchi. Heart: Regular rate and rhythm. No murmur, rub or gallop appreciated.  Abdomen: Soft, nondistended. Mild upper abdominal, RLQ and central lower abdominal tenderness without rebound or guarding. No masses. No hepatosplenomegaly. Normoactive bowel sounds x 4 quadrants.  Rectal: Deferred.  Musculoskeletal: Symmetrical with no gross deformities. Skin: Warm and dry. No rash or lesions on visible extremities. Extremities: No  edema. Neurological: Alert oriented x 4, no focal deficits.  Psychological:  Alert and cooperative. Normal mood and affect.  ASSESSMENT AND PLAN:   30. 69 year old female with GERD, history of duodenal ulcers and gastric erosions secondary to NSAIDs in 2000. See HPI.  -EGD benefits and risks discussed including risk with sedation, risk of bleeding, perforation and infection  -Continue Famotidine 20mg  po  Bid -Carafate 1gm po bid PRN -CBC, CMP -Further follow up to be determined after EGD completed   2. Colon cancer screening. Colonoscopy 10/25/2010, no polyps. -Next colonoscopy due 10/2020, earlier if symptoms warrant.   3. Right liver cyst   CC:  Loman Brooklyn, FNP

## 2019-03-22 ENCOUNTER — Ambulatory Visit (INDEPENDENT_AMBULATORY_CARE_PROVIDER_SITE_OTHER): Payer: Medicare Other | Admitting: Nurse Practitioner

## 2019-03-22 ENCOUNTER — Telehealth: Payer: Self-pay | Admitting: Nurse Practitioner

## 2019-03-22 ENCOUNTER — Encounter: Payer: Self-pay | Admitting: Nurse Practitioner

## 2019-03-22 ENCOUNTER — Other Ambulatory Visit (INDEPENDENT_AMBULATORY_CARE_PROVIDER_SITE_OTHER): Payer: Medicare Other

## 2019-03-22 VITALS — BP 110/64 | HR 64 | Temp 97.9°F | Ht 60.0 in | Wt 116.4 lb

## 2019-03-22 DIAGNOSIS — Z01818 Encounter for other preprocedural examination: Secondary | ICD-10-CM | POA: Diagnosis not present

## 2019-03-22 DIAGNOSIS — K219 Gastro-esophageal reflux disease without esophagitis: Secondary | ICD-10-CM

## 2019-03-22 LAB — COMPREHENSIVE METABOLIC PANEL
ALT: 19 U/L (ref 0–35)
AST: 22 U/L (ref 0–37)
Albumin: 4.1 g/dL (ref 3.5–5.2)
Alkaline Phosphatase: 83 U/L (ref 39–117)
BUN: 21 mg/dL (ref 6–23)
CO2: 31 mEq/L (ref 19–32)
Calcium: 9.3 mg/dL (ref 8.4–10.5)
Chloride: 106 mEq/L (ref 96–112)
Creatinine, Ser: 0.82 mg/dL (ref 0.40–1.20)
GFR: 69.19 mL/min (ref >60.00)
Glucose, Bld: 82 mg/dL (ref 70–99)
Potassium: 4.1 mEq/L (ref 3.5–5.1)
Sodium: 143 mEq/L (ref 135–145)
Total Bilirubin: 0.7 mg/dL (ref 0.2–1.2)
Total Protein: 6.8 g/dL (ref 6.0–8.3)

## 2019-03-22 LAB — CBC WITH DIFFERENTIAL/PLATELET
Basophils Absolute: 0 10*3/uL (ref 0.0–0.1)
Basophils Relative: 0.5 % (ref 0.0–3.0)
Eosinophils Absolute: 0 10*3/uL (ref 0.0–0.7)
Eosinophils Relative: 0.7 % (ref 0.0–5.0)
HCT: 39 % (ref 36.0–46.0)
Hemoglobin: 13.1 g/dL (ref 12.0–15.0)
Lymphocytes Relative: 23.2 % (ref 12.0–46.0)
Lymphs Abs: 1.5 10*3/uL (ref 0.7–4.0)
MCHC: 33.6 g/dL (ref 30.0–36.0)
MCV: 92.9 fl (ref 78.0–100.0)
Monocytes Absolute: 0.6 10*3/uL (ref 0.1–1.0)
Monocytes Relative: 9.3 % (ref 3.0–12.0)
Neutro Abs: 4.4 10*3/uL (ref 1.4–7.7)
Neutrophils Relative %: 66.3 % (ref 43.0–77.0)
Platelets: 292 10*3/uL (ref 150.0–400.0)
RBC: 4.2 Mil/uL (ref 3.87–5.11)
RDW: 13.5 % (ref 11.5–15.5)
WBC: 6.6 10*3/uL (ref 4.0–10.5)

## 2019-03-22 MED ORDER — SUCRALFATE 1 G PO TABS: 1.0000 g | ORAL_TABLET | Freq: Two times a day (BID) | ORAL | 1 refills | Status: DC

## 2019-03-22 NOTE — Telephone Encounter (Signed)
Please see result note for documentation 

## 2019-03-22 NOTE — Telephone Encounter (Signed)
Patient returned your call per lab note I advised the patient that her results were normal.

## 2019-03-22 NOTE — Patient Instructions (Signed)
If you are age 69 or older, your body mass index should be between 23-30. Your Body mass index is 22.73 kg/m. If this is out of the aforementioned range listed, please consider follow up with your Primary Care Provider.  If you are age 64 or younger, your body mass index should be between 19-25. Your Body mass index is 22.73 kg/m. If this is out of the aformentioned range listed, please consider follow up with your Primary Care Provider.   Your provider has requested that you go to the basement level for lab work before leaving today. Press "B" on the elevator. The lab is located at the first door on the left as you exit the elevator.  Due to recent changes in healthcare laws, you may see the results of your imaging and laboratory studies on MyChart before your provider has had a chance to review them.  We understand that in some cases there may be results that are confusing or concerning to you. Not all laboratory results come back in the same time frame and the provider may be waiting for multiple results in order to interpret others.  Please give [place] 48 hours in order for your provider to thoroughly review all the results before contacting the office for clarification of your results.   Thank you for choosing [facility] Arnaldo Natal, CRNP

## 2019-03-23 NOTE — Progress Notes (Signed)
Attending Physician's Attestation   I have reviewed the chart.   I agree with the Advanced Practitioner's note, impression, and recommendations with any updates as below.    Tabias Swayze Mansouraty, MD [facility] Office # [phone]  

## 2019-04-05 ENCOUNTER — Ambulatory Visit (INDEPENDENT_AMBULATORY_CARE_PROVIDER_SITE_OTHER): Payer: Medicare Other

## 2019-04-05 DIAGNOSIS — Z0000 Encounter for general adult medical examination without abnormal findings: Secondary | ICD-10-CM

## 2019-04-05 NOTE — Progress Notes (Signed)
MEDICARE ANNUAL WELLNESS VISIT  04/05/2019  Telephone Visit Disclaimer This Medicare AWV was conducted by telephone due to national recommendations for restrictions regarding the COVID-19 Pandemic (e.g. social distancing).  I verified, using two identifiers, that I am speaking with Denise Benton or their authorized healthcare agent. I discussed the limitations, risks, security, and privacy concerns of performing an evaluation and management service by telephone and the potential availability of an in-person appointment in the future. The patient expressed understanding and agreed to proceed.   Subjective:  Denise Benton is a 69 y.o. female patient of Gwenlyn Fudge, FNP who had a Medicare Annual Wellness Visit today via telephone. 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  She currently is working part-time at [employer].  She lives with her spouse. She has two grown children, a son and a daughter. She reports that she is socially active and does interact with friends/family regularly. She is  moderately physically active and enjoys gardening and crafting in her spare time.  Patient Care Team: Gwenlyn Fudge, FNP as PCP - General (Family Medicine) Arnaldo Natal, NP as Nurse Practitioner (Gastroenterology)  Advanced Directives 04/05/2019  Does Patient Have a Medical Advance Directive? Yes  Type of Estate agent of [place];Living will  Does patient want to make changes to medical advance directive? No - Patient declined  Copy of Healthcare Power of Attorney in Chart? No - copy requested    Hospital Utilization Over the Past 12 Months: # of hospitalizations or ER visits: 0 # of surgeries: 0  Review of Systems    Patient reports that her overall health is worse compared to last year because of the problems she is having with her stomach.  History obtained from chart review and the  patient  Patient Reported Readings (BP, Pulse, CBG, Weight, etc) 110/68   Pain Assessment Pain : 0-10 Pain Score: 2  Pain Type: Acute pain Pain Location: Other (Comment)(stomach pain - has endoscopy scheduled next week) Pain Orientation: Mid Pain Descriptors / Indicators: Aching, Burning Pain Onset: More than a month ago Pain Frequency: Occasional Pain Relieving Factors: Mylanta, watching what she eats Effect of Pain on Daily Activities: None  Pain Relieving Factors: Mylanta, watching what she eats  Current Medications & Allergies (verified) Allergies as of 04/05/2019      Reactions   Ivp Dye [iodinated Diagnostic Agents] Other (See Comments)   Respiratory distress   Codeine Nausea Only   Niaspan [niacin Er] Rash, Other (See Comments)   Nightmares      Medication List       Accurate as of April 05, 2019  9:48 AM. If you have any questions, ask your nurse or doctor.        STOP taking these medications   famotidine 20 MG tablet Commonly known as: PEPCID     TAKE these medications   acetaminophen 325 MG tablet Commonly known as: TYLENOL Take 325 mg by mouth as needed.   Advair HFA 115-21 MCG/ACT inhaler Generic drug: fluticasone-salmeterol as needed.   albuterol 108 (90 Base) MCG/ACT inhaler Commonly known as: VENTOLIN HFA Inhale into the lungs.   aspirin EC 81 MG tablet Take 81 mg by mouth once a week.   Biotin 53976 MCG Tabs Take by mouth daily.   CALTRATE 600 PO Take 1 tablet by mouth daily.   CO Q-10 PLUS L-CARNITINE PO Take 1 tablet by mouth daily.  DHA PO Take 1 tablet by mouth daily.   ELDERBERRY PO Take 1 Dose by mouth as needed.   EMERGEN-C IMMUNE PO Take 1 tablet by mouth as needed.   EPA PO Take 1 tablet by mouth 3 (three) times a week.   folic acid 413 MCG tablet Commonly known as: FOLVITE Take 400 mcg by mouth 3 (three) times a week.   MULTIVITAMIN ADULT PO Take by mouth.   MYLANTA PO Take by mouth.   sucralfate 1 g  tablet Commonly known as: Carafate Take 1 tablet (1 g total) by mouth 2 (two) times daily.   Vitamin D 50 MCG (2000 UT) Caps Take by mouth daily.   VITAMIN-B COMPLEX PO Take 1 tablet by mouth daily.       History (reviewed): Past Medical History:  Diagnosis Date  . Allergy    seasonal  . Arthritis   . Asthma   . Duodenal ulcer   . Gallstones   . GERD (gastroesophageal reflux disease)   . HLD (hyperlipidemia)   . IBS (irritable bowel syndrome)   . Kidney stones   . Lumbosacral disc disease    L4-5, L5-S1  . Osteopenia    Past Surgical History:  Procedure Laterality Date  . BREAST CYST ASPIRATION Right    pt has cysts aspiration done for lumps  . Ramer   x2; again in 1984  . LAPAROSCOPIC CHOLECYSTECTOMY  2000  . SKIN LESION EXCISION  07/2011   from scalp - keratosis   Family History  Problem Relation Age of Onset  . Tremor Mother   . Hyperlipidemia Mother   . Skin cancer Mother   . Dementia Mother   . Congestive Heart Failure Father   . Diabetes Father   . Heart disease Father        congestive heart failure  . Multiple sclerosis Sister   . Heart disease Sister   . Kidney Stones Sister   . Hypertension Daughter   . Anxiety disorder Daughter   . Diabetes Paternal Grandfather   . Tremor Sister    Social History   Socioeconomic History  . Marital status: Married    Spouse name: Jeneen Rinks   . Number of children: 2  . Years of education: 4 years college  . Highest education level: Bachelor's degree (e.g., BA, AB, BS)  Occupational History  . Occupation: Retired RN/NP  . Occupation: Works part time at [employer]  . Smoking status: Former Smoker    Packs/day: 0.50    Years: 1.00    Pack years: 0.50    Types: Cigarettes    Quit date: 01/13/1969    Years since quitting: 50.2  . Smokeless tobacco: Never Used  Substance and Sexual Activity  . Alcohol use: No  . Drug use: No  . Sexual activity: Not Currently  Other Topics  Concern  . Not on file  Social History Narrative  . Not on file   Social Determinants of Health   Financial Resource Strain:   . Difficulty of Paying Living Expenses:   Food Insecurity:   . Worried About [name] fundraiser in the Last Year:   . Arboriculturist in the Last Year:   Transportation Needs:   . Film/video editor (Medical):   Marland Kitchen Lack of Transportation (Non-Medical):   Physical Activity:   . Days of Exercise per Week:   . Minutes of Exercise per Session:   Stress:   . Feeling  of Stress :   Social Connections:   . Frequency of Communication with Friends and Family:   . Frequency of Social Gatherings with Friends and Family:   . Attends Religious Services:   . Active Member of Clubs or Organizations:   . Attends Banker Meetings:   Marland Kitchen Marital Status:     Activities of Daily Living In your present state of health, do you have any difficulty performing the following activities: 04/05/2019  Hearing? N  Vision? N  Difficulty concentrating or making decisions? N  Walking or climbing stairs? N  Dressing or bathing? N  Doing errands, shopping? N  Preparing Food and eating ? N  Using the Toilet? N  In the past six months, have you accidently leaked urine? N  Do you have problems with loss of bowel control? Y  Comment depending on what she eats  Managing your Medications? N  Managing your Finances? N  Housekeeping or managing your Housekeeping? 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   Depression Screen PHQ 2/9 Scores 04/05/2019 03/15/2019 01/28/2019  PHQ - 2 Score 0 0 0     Fall Risk Fall Risk  04/05/2019 03/15/2019 01/28/2019  Falls in the past year? 0 0 0     Objective:  Denise Benton seemed alert and oriented and she participated appropriately during our telephone visit.  Blood Pressure Weight BMI  BP Readings from Last 3 Encounters:  03/22/19 110/64  03/15/19 110/66  01/28/19 118/70   Wt Readings from Last 3 Encounters:  03/22/19 116 lb 6 oz (52.8 kg)  03/15/19 113 lb 3.2 oz (51.3 kg)  01/28/19 112 lb (50.8 kg)   BMI Readings from Last 1 Encounters:  03/22/19 22.73 kg/m    *Unable to obtain current vital signs, weight, and BMI due to telephone visit type  Hearing/Vision  . [place] did not seem to have difficulty with hearing/understanding during the telephone conversation . Reports that she has had a formal eye exam by an eye care professional within the past year . Reports that she has not had a formal hearing evaluation within the past year *Unable to fully assess hearing and vision during telephone visit type  Cognitive Function: 6CIT Screen 04/05/2019  What Year? 0 points  What month? 0 points  What time? 0 points  Count back from 20 0 points  Months in reverse 0 points  Repeat phrase 0 points  Total Score 0   (Normal:0-7, Significant for Dysfunction: >8)  Normal Cognitive Function Screening: Yes   Immunization & Health Maintenance Record Immunization History  Administered Date(s) Administered  . Hepatitis A 11/09/2007  . IPV 11/09/2007  . Influenza,inj,quad, With Preservative 11/14/2018  . Pneumococcal Conjugate-13 11/18/2016  . Pneumococcal Polysaccharide-23 10/25/2018  . Td 01/13/2005  . Tdap 10/24/2008  . Typhoid Live 11/09/2007     Health  Maintenance  Topic Date Due  . COLONOSCOPY  Never done  . MAMMOGRAM  12/31/2013  . TETANUS/TDAP  10/25/2018  . Hepatitis C Screening  01/28/2020 (Originally Jun 07, 1950)  . INFLUENZA VACCINE  Completed  . DEXA SCAN  Completed  . PNA vac Low Risk Adult  Completed       Assessment  This is a routine wellness examination for [employer].  Health Maintenance: Due or Overdue Health Maintenance Due  Topic Date Due  . COLONOSCOPY  Never done  . MAMMOGRAM  12/31/2013  . TETANUS/TDAP  10/25/2018    Lamar Blinks [place] does not need a referral for [employer] Assistance: Care Management:   no Social Work:    no Prescription Assistance:  no Nutrition/Diabetes Education:  no   Plan:  Personalized Goals Goals Addressed            This Visit's Progress   . Patient Stated        Personalized Health Maintenance & Screening Recommendations  Td vaccine  Lung Cancer Screening Recommended: no (Low Dose CT Chest recommended if Age 68-80 years, 30 pack-year currently smoking OR have quit w/in past 15 years) Hepatitis C Screening recommended: no HIV Screening recommended: yes  Advanced Directives: Written information was not prepared per patient's request.  Referrals & Orders No orders of the defined types were placed in this encounter.   Follow-up Plan . Follow-up with Gwenlyn Fudge, FNP as planned   I have personally reviewed and noted the following in the patient's chart:   . Medical and social history . Use of alcohol, tobacco or illicit drugs  . Current medications and supplements . Functional ability and status . Nutritional status . Physical activity . Advanced directives . List of other physicians . Hospitalizations, surgeries, and ER visits in previous 12 months . Vitals . Screenings to include cognitive, depression, and falls . Referrals and appointments  In addition, I have reviewed and discussed with Denise Benton certain preventive  protocols, quality metrics, and best practice recommendations. A written personalized care plan for preventive services as well as general preventive health recommendations is available and can be mailed to the patient at her request.      Mariam Dollar, LPN    [id number]

## 2019-04-11 ENCOUNTER — Other Ambulatory Visit: Payer: Self-pay | Admitting: Gastroenterology

## 2019-04-11 ENCOUNTER — Ambulatory Visit (INDEPENDENT_AMBULATORY_CARE_PROVIDER_SITE_OTHER): Payer: Medicare Other

## 2019-04-11 DIAGNOSIS — Z1159 Encounter for screening for other viral diseases: Secondary | ICD-10-CM

## 2019-04-11 LAB — SARS CORONAVIRUS 2 (TAT 6-24 HRS): SARS Coronavirus 2: NEGATIVE

## 2019-04-13 ENCOUNTER — Encounter: Payer: Self-pay | Admitting: Gastroenterology

## 2019-04-13 ENCOUNTER — Other Ambulatory Visit: Payer: Self-pay

## 2019-04-13 ENCOUNTER — Ambulatory Visit (AMBULATORY_SURGERY_CENTER): Payer: Medicare Other | Admitting: Gastroenterology

## 2019-04-13 VITALS — BP 106/59 | HR 62 | Temp 96.6°F | Resp 12 | Ht 60.0 in | Wt 116.0 lb

## 2019-04-13 DIAGNOSIS — K317 Polyp of stomach and duodenum: Secondary | ICD-10-CM

## 2019-04-13 DIAGNOSIS — K219 Gastro-esophageal reflux disease without esophagitis: Secondary | ICD-10-CM | POA: Diagnosis not present

## 2019-04-13 DIAGNOSIS — K3189 Other diseases of stomach and duodenum: Secondary | ICD-10-CM

## 2019-04-13 DIAGNOSIS — R1310 Dysphagia, unspecified: Secondary | ICD-10-CM | POA: Diagnosis not present

## 2019-04-13 MED ORDER — SODIUM CHLORIDE 0.9 % IV SOLN: 500.0000 mL | Freq: Once | INTRAVENOUS | Status: DC

## 2019-04-13 MED ORDER — ESOMEPRAZOLE MAGNESIUM 40 MG PO CPDR: 40.0000 mg | DELAYED_RELEASE_CAPSULE | Freq: Every day | ORAL | 2 refills | Status: DC

## 2019-04-13 NOTE — Progress Notes (Signed)
Report given to PACU, vss 

## 2019-04-13 NOTE — Op Note (Signed)
[place] Patient Name: Denise Benton Procedure Date: 04/13/2019 9:53 AM MRN: [id number] Endoscopist: Justice Britain , MD Age: 69 Referring MD:  Date of Birth: 14-Feb-1950 Gender: Female Account #: [id number] Procedure:                Upper GI endoscopy Indications:              Dysphagia, Heartburn, Esophageal reflux symptoms                            that recur despite therapy although did not                            tolerate Omeprazole Medicines:                Monitored Anesthesia Care Procedure:                Pre-Anesthesia Assessment:                           - Prior to the procedure, a History and Physical                            was performed, and patient medications and                            allergies were reviewed. The patient's tolerance of                            previous anesthesia was also reviewed. The risks                            and benefits of the procedure and the sedation                            options and risks were discussed with the patient.                            All questions were answered, and informed consent                            was obtained. Prior Anticoagulants: The patient has                            taken no previous anticoagulant or antiplatelet                            agents except for aspirin. ASA Grade Assessment: II                            - A patient with mild systemic disease. After                            reviewing the risks and benefits, the patient was  deemed in satisfactory condition to undergo the                            procedure.                           After obtaining informed consent, the endoscope was                            passed under direct vision. Throughout the                            procedure, the patient's blood pressure, pulse, and                            oxygen saturations were monitored continuously. The           Endoscope was introduced through the mouth, and                            advanced to the second part of duodenum. The upper                            GI endoscopy was accomplished without difficulty.                            The patient tolerated the procedure. Scope In: Scope Out: Findings:                 No gross lesions were noted in the entire                            esophagus. Biopsies were taken with a cold forceps                            for histology to rule out EoE/LoE. Biopsies were                            taken with a cold forceps for histology from the                            proximal/middle esophagus and placed in a jar and                            then from the distal esophagus and placed in a                            separate jar.                           After the completion of the rest of the endoscopic                            evaluation a guidewire was placed and the scope was  withdrawn. Dilation was performed in the entire                            esophagus with a Savary dilator with no resistance                            at 17 mm and mild resistance at 18 mm. The dilation                            site was examined following endoscope reinsertion                            and showed no change and no bleeding, mucosal tear                            or perforation.                           Patchy moderate inflammation characterized by                            erosions and erythema was found in the gastric                            antrum and in the prepyloric region of the stomach.                           Multiple 2 to 5 mm semi-sessile polyps with no                            bleeding and no stigmata of recent bleeding were                            found in the gastric body - consistent with fundic                            gland polyps. Biopsies were taken with a cold                             forceps for histology.                           No other gross lesions were noted in the entire                            examined stomach. Biopsies were taken with a cold                            forceps for histology and Helicobacter pylori                            testing.  No gross lesions were noted in the duodenal bulb,                            in the first portion of the duodenum and in the                            second portion of the duodenum. Complications:            No immediate complications. Estimated Blood Loss:     Estimated blood loss was minimal. Impression:               - No gross lesions in esophagus. Biopsied for EoE.                            Dilation performed.                           - Gastritis. Biopsied.                           - Multiple gastric polyps. Biopsied.                           - No gross lesions in the duodenal bulb, in the                            first portion of the duodenum and in the second                            portion of the duodenum. Recommendation:           - The patient will be observed post-procedure,                            until all discharge criteria are met.                           - Discharge patient to home.                           - Patient has a contact number available for                            emergencies. The signs and symptoms of potential                            delayed complications were discussed with the                            patient. Return to normal activities tomorrow.                            Written discharge instructions were provided to the                            patient.                           -  Dilation Diet to be followed today.                           - Recommend starting Dexilant 30 mg daily or Nexium                            40 mg daily at minimum. If patient hesitant because                            of prior PPI issues with  Omeprazole and diarrhea                            then would consider Pepcid 20 mg twice daily and                            trial Gaviscon 2-3 times daily.                           - If we rule out EoE and LoE and patient's symptoms                            persist would recommend pH impedence testing off                            any acid suppression and Manometry.                           - Follow up in clinic in 3-4 weeks for further                            workup/management.                           - May use Chloraseptic/Cepacol lozenges for sore                            throat at this time if needed for next 72 hours.                           - The findings and recommendations were discussed                            with the patient. Justice Britain, MD 04/13/2019 10:28:41 AM

## 2019-04-13 NOTE — Patient Instructions (Signed)
Handout given:  POST DILATION DIET Start Nexium 40mg  once daily Follow up in office in 3-4 weeks  YOU HAD AN ENDOSCOPIC PROCEDURE TODAY AT THE [facility]:   Refer to the procedure report that was given to you for any specific questions about what was found during the examination.  If the procedure report does not answer your questions, please call your gastroenterologist to clarify.  If you requested that your care partner not be given the details of your procedure findings, then the procedure report has been included in a sealed envelope for you to review at your convenience later.  YOU SHOULD EXPECT: Some feelings of bloating in the abdomen. Passage of more gas than usual.  Walking can help get rid of the air that was put into your GI tract during the procedure and reduce the bloating. If you had a lower endoscopy (such as a colonoscopy or flexible sigmoidoscopy) you may notice spotting of blood in your stool or on the toilet paper. If you underwent a bowel prep for your procedure, you may not have a normal bowel movement for a few days.  Please Note:  You might notice some irritation and congestion in your nose or some drainage.  This is from the oxygen used during your procedure.  There is no need for concern and it should clear up in a day or so.  SYMPTOMS TO REPORT IMMEDIATELY:     Following upper endoscopy (EGD)  Vomiting of blood or coffee ground material  New chest pain or pain under the shoulder blades  Painful or persistently difficult swallowing  New shortness of breath  Fever of 100F or higher  Black, tarry-looking stools  For urgent or emergent issues, a gastroenterologist can be reached at any hour by calling [phone]. Do not use MyChart messaging for urgent concerns.    DIET:  We do recommend a small meal at first, but then you may proceed to your regular diet.  Drink plenty of fluids but you should avoid alcoholic beverages for 24 hours.  ACTIVITY:   You should plan to take it easy for the rest of today and you should NOT DRIVE or use heavy machinery until tomorrow (because of the sedation medicines used during the test).    FOLLOW UP: Our staff will call the number listed on your records 48-72 hours following your procedure to check on you and address any questions or concerns that you may have regarding the information given to you following your procedure. If we do not reach you, we will leave a message.  We will attempt to reach you two times.  During this call, we will ask if you have developed any symptoms of COVID 19. If you develop any symptoms (ie: fever, flu-like symptoms, shortness of breath, cough etc.) before then, please call [phone].  If you test positive for Covid 19 in the 2 weeks post procedure, please call and report this information to [phone].    If any biopsies were taken you will be contacted by phone or by letter within the next 1-3 weeks.  Please call us at [phone] if you have not heard about the biopsies in 3 weeks.    SIGNATURES/CONFIDENTIALITY: You and/or your care partner have signed paperwork which will be entered into your electronic medical record.  These signatures attest to the fact that that the information above on your After Visit Summary has been reviewed and is understood.  Full responsibility of the confidentiality of this discharge  information lies with you and/or your care-partner. 

## 2019-04-13 NOTE — Progress Notes (Signed)
Called to room to assist during endoscopic procedure.  Patient ID and intended procedure confirmed with present staff. Received instructions for my participation in the procedure from the performing physician.  

## 2019-04-13 NOTE — Progress Notes (Signed)
Temp taken by LC VS taken by DT 

## 2019-04-16 ENCOUNTER — Encounter: Payer: Self-pay | Admitting: Gastroenterology

## 2019-04-18 ENCOUNTER — Telehealth: Payer: Self-pay | Admitting: *Deleted

## 2019-04-18 NOTE — Telephone Encounter (Signed)
  Follow up Call-  Call back number 04/13/2019  Post procedure Call Back phone  # [phone]  Permission to leave phone message Yes  Some recent data might be hidden     Patient questions:  Do you have a fever, pain , or abdominal swelling? No. Pain Score  0 *  Have you tolerated food without any problems? Yes.    Have you been able to return to your normal activities? Yes.    Do you have any questions about your discharge instructions: Diet   No. Medications  No. Follow up visit  No.  Do you have questions or concerns about your Care? No.  Actions: * If pain score is 4 or above: No action needed, pain <4.  1. Have you developed a fever since your procedure? no  2.   Have you had an respiratory symptoms (SOB or cough) since your procedure? no  3.   Have you tested positive for COVID 19 since your procedure no  4.   Have you had any family members/close contacts diagnosed with the COVID 19 since your procedure?  no   If yes to any of these questions please route to Laverna Peace, RN and Charlett Lango, RN

## 2019-04-21 ENCOUNTER — Encounter: Payer: Self-pay | Admitting: Family Medicine

## 2019-04-21 ENCOUNTER — Ambulatory Visit (INDEPENDENT_AMBULATORY_CARE_PROVIDER_SITE_OTHER): Payer: Medicare Other | Admitting: Family Medicine

## 2019-04-21 ENCOUNTER — Other Ambulatory Visit: Payer: Self-pay

## 2019-04-21 VITALS — BP 118/63 | HR 53 | Temp 94.1°F | Ht 60.0 in | Wt 118.2 lb

## 2019-04-21 DIAGNOSIS — R1310 Dysphagia, unspecified: Secondary | ICD-10-CM | POA: Diagnosis not present

## 2019-04-21 DIAGNOSIS — N644 Mastodynia: Secondary | ICD-10-CM | POA: Diagnosis not present

## 2019-04-21 DIAGNOSIS — M79622 Pain in left upper arm: Secondary | ICD-10-CM | POA: Diagnosis not present

## 2019-04-21 DIAGNOSIS — R070 Pain in throat: Secondary | ICD-10-CM | POA: Diagnosis not present

## 2019-04-21 NOTE — Progress Notes (Signed)
Assessment & Plan:  1. Dysphagia, unspecified type - US SOFT TISSUE HEAD & NECK (NON-THYROID); Future  2. Throat discomfort - US SOFT TISSUE HEAD & NECK (NON-THYROID); Future  3. Pain of left breast - MM DIAG BREAST TOMO UNI LEFT; Future - US BREAST LTD UNI LEFT INC AXILLA; Future  4. Left axillary pain - MM DIAG BREAST TOMO UNI LEFT; Future - US BREAST LTD UNI LEFT INC AXILLA; Future   Follow up plan: Return if symptoms worsen or fail to improve.  [name], MSN, APRN, FNP-C [facility]  Subjective:   Patient ID: Denise Benton, female    DOB: 1950/12/09, 69 y.o.   MRN: [id number]  HPI: Denise Benton is a 69 y.o. female presenting on 04/21/2019 for trouble swallowing  (x 1 year and patient states it has not gotten better) and axilla pain (Patient states she has been having left axilla pain x 6 months that will go across her chest.)  Patient is extremely concerned regarding trouble swallowing and left axillary pain. She has had trouble swallowing for a year and a half now. She reports "it feels thick" in her throat. She feels her vocal cords are effected because she can barely put any pressure on her throat and it changes her voice. She has seen a gastroenterologist and had an esophageal dilation which was not helpful. Her GERD is well controlled with Nexium. She would like imaging of her neck completed.   She is also experiencing pain in her left axilla that she feels is related to her lymph glands. This has been going on for the past 6 months. She had a normal mammogram at the end of October 2020 but it was only a 2D mammogram.    ROS: Negative unless specifically indicated above in HPI.   Relevant past medical history reviewed and updated as indicated.   Allergies and medications reviewed and updated.   Current Outpatient Medications:  .  acetaminophen (TYLENOL) 325 MG tablet, Take 325 mg by mouth as needed., Disp: , Rfl:  .  albuterol  (VENTOLIN HFA) 108 (90 Base) MCG/ACT inhaler, Inhale into the lungs., Disp: , Rfl:  .  Alum & Mag Hydroxide-Simeth (MYLANTA PO), Take by mouth., Disp: , Rfl:  .  aspirin EC 81 MG tablet, Take 81 mg by mouth once a week., Disp: , Rfl:  .  B Complex Vitamins (VITAMIN-B COMPLEX PO), Take 1 tablet by mouth daily., Disp: , Rfl:  .  Biotin 12878 MCG TABS, Take by mouth daily., Disp: , Rfl:  .  Calcium Carbonate (CALTRATE 600 PO), Take 1 tablet by mouth daily., Disp: , Rfl:  .  Cholecalciferol (VITAMIN D) 50 MCG (2000 UT) CAPS, Take by mouth daily. , Disp: , Rfl:  .  Docosahexaenoic Acid (DHA PO), Take 1 tablet by mouth daily., Disp: , Rfl:  .  ELDERBERRY PO, Take 1 Dose by mouth as needed., Disp: , Rfl:  .  esomeprazole (NEXIUM) 40 MG capsule, Take 1 capsule (40 mg total) by mouth daily at 12 noon., Disp: 30 capsule, Rfl: 2 .  fluticasone-salmeterol (ADVAIR HFA) 115-21 MCG/ACT inhaler, as needed. , Disp: , Rfl:  .  folic acid (FOLVITE) 800 MCG tablet, Take 400 mcg by mouth 3 (three) times a week., Disp: , Rfl:  .  levOCARNitine-E-Coenzyme Q10 (CO Q-10 PLUS L-CARNITINE PO), Take 1 tablet by mouth daily., Disp: , Rfl:  .  Multiple Vitamin (MULTIVITAMIN ADULT PO), Take by mouth., Disp: , Rfl:  .  Multiple Vitamins-Minerals (EMERGEN-C IMMUNE PO), Take 1 tablet by mouth as needed., Disp: , Rfl:  .  Omega-3 Fatty Acids (EPA PO), Take 1 tablet by mouth 3 (three) times a week. , Disp: , Rfl:  .  sucralfate (CARAFATE) 1 g tablet, Take 1 tablet (1 g total) by mouth 2 (two) times daily., Disp: 30 tablet, Rfl: 1  Current Facility-Administered Medications:  .  0.9 %  sodium chloride infusion, 500 mL, Intravenous, Once, Mansouraty, Telford Nab., MD  Allergies  Allergen Reactions  . Ivp Dye [Iodinated Diagnostic Agents] Other (See Comments)    Respiratory distress  . Codeine Nausea Only  . Niaspan [Niacin Er] Rash and Other (See Comments)    Nightmares    Objective:   BP 118/63   Pulse (!) 53   Temp (!)  94.1 F (34.5 C) (Temporal)   Ht 5' (1.524 m)   Wt 118 lb 3.2 oz (53.6 kg)   SpO2 100%   BMI 23.08 kg/m    Physical Exam Vitals reviewed.  Constitutional:      General: She is not in acute distress.    Appearance: Normal appearance. She is not ill-appearing, toxic-appearing or diaphoretic.  HENT:     Head: Normocephalic and atraumatic.  Eyes:     General: No scleral icterus.       Right eye: No discharge.        Left eye: No discharge.     Conjunctiva/sclera: Conjunctivae normal.  Cardiovascular:     Rate and Rhythm: Normal rate.  Pulmonary:     Effort: Pulmonary effort is normal. No respiratory distress.  Chest:     Breasts:        Right: Normal.        Left: Tenderness (outer quadrant near axilla) present.  Musculoskeletal:        General: Normal range of motion.     Cervical back: Normal range of motion.  Lymphadenopathy:     Head:     Right side of head: No submental, submandibular, tonsillar, preauricular, posterior auricular or occipital adenopathy.     Left side of head: No submental, submandibular, tonsillar, preauricular, posterior auricular or occipital adenopathy.     Cervical: No cervical adenopathy.     Right cervical: No superficial, deep or posterior cervical adenopathy.    Left cervical: No superficial, deep or posterior cervical adenopathy.     Upper Body:     Left upper body: No supraclavicular, axillary or pectoral adenopathy.  Skin:    General: Skin is warm and dry.     Capillary Refill: Capillary refill takes less than 2 seconds.  Neurological:     General: No focal deficit present.     Mental Status: She is alert and oriented to person, place, and time. Mental status is at baseline.  Psychiatric:        Mood and Affect: Mood normal.        Behavior: Behavior normal.        Thought Content: Thought content normal.        Judgment: Judgment normal.

## 2019-04-25 ENCOUNTER — Encounter: Payer: Self-pay | Admitting: Family Medicine

## 2019-04-26 ENCOUNTER — Telehealth: Payer: Self-pay | Admitting: Family Medicine

## 2019-04-29 ENCOUNTER — Other Ambulatory Visit: Payer: Self-pay

## 2019-04-29 ENCOUNTER — Ambulatory Visit (HOSPITAL_COMMUNITY)
Admission: RE | Admit: 2019-04-29 | Discharge: 2019-04-29 | Disposition: A | Discharge status: Home / Self Care | Payer: Medicare Other | Source: Ambulatory Visit | Attending: Family Medicine | Admitting: Family Medicine

## 2019-04-29 ENCOUNTER — Other Ambulatory Visit: Payer: Self-pay | Admitting: Family Medicine

## 2019-04-29 DIAGNOSIS — R9389 Abnormal findings on diagnostic imaging of other specified body structures: Secondary | ICD-10-CM

## 2019-04-29 DIAGNOSIS — R1310 Dysphagia, unspecified: Secondary | ICD-10-CM | POA: Diagnosis not present

## 2019-04-29 DIAGNOSIS — R070 Pain in throat: Secondary | ICD-10-CM | POA: Insufficient documentation

## 2019-05-02 ENCOUNTER — Other Ambulatory Visit: Payer: Self-pay

## 2019-05-02 ENCOUNTER — Other Ambulatory Visit: Payer: Medicare Other

## 2019-05-02 DIAGNOSIS — R9389 Abnormal findings on diagnostic imaging of other specified body structures: Secondary | ICD-10-CM

## 2019-05-03 LAB — THYROID PANEL WITH TSH
Free Thyroxine Index: 1.9 (ref 1.2–4.9)
T3 Uptake Ratio: 29 % (ref 24–39)
T4, Total: 6.6 ug/dL (ref 4.5–12.0)
TSH: 4.17 u[IU]/mL (ref 0.450–4.500)

## 2019-05-17 ENCOUNTER — Encounter: Payer: Self-pay | Admitting: Gastroenterology

## 2019-05-17 ENCOUNTER — Ambulatory Visit (INDEPENDENT_AMBULATORY_CARE_PROVIDER_SITE_OTHER): Payer: Medicare Other | Admitting: Gastroenterology

## 2019-05-17 VITALS — BP 110/70 | HR 66 | Temp 98.7°F | Ht 60.0 in | Wt 114.0 lb

## 2019-05-17 DIAGNOSIS — R1319 Other dysphagia: Secondary | ICD-10-CM | POA: Diagnosis not present

## 2019-05-17 DIAGNOSIS — K219 Gastro-esophageal reflux disease without esophagitis: Secondary | ICD-10-CM

## 2019-05-17 DIAGNOSIS — R1310 Dysphagia, unspecified: Secondary | ICD-10-CM

## 2019-05-17 MED ORDER — ESOMEPRAZOLE MAGNESIUM 20 MG PO CPDR: 20.0000 mg | DELAYED_RELEASE_CAPSULE | Freq: Every day | ORAL | 3 refills | Status: DC

## 2019-05-17 NOTE — Patient Instructions (Signed)
We have sent the following medications to your pharmacy for you to pick up at your convenience: Nexium   Referral for ENT has been sent to Dr. Narda Bonds office. Their office will contact you with an appointment.   Continue 40mg  of Nexium until you have your evaluation with ENT.   If you are age 69 or older, your body mass index should be between 23-30. Your Body mass index is 22.26 kg/m. If this is out of the aforementioned range listed, please consider follow up with your Primary Care Provider.  If you are age 44 or younger, your body mass index should be between 19-25. Your Body mass index is 22.26 kg/m. If this is out of the aformentioned range listed, please consider follow up with your Primary Care Provider.    Thank you for choosing me and [facility].  Dr. [id number]

## 2019-05-18 ENCOUNTER — Telehealth: Payer: Self-pay | Admitting: Gastroenterology

## 2019-05-18 ENCOUNTER — Encounter: Payer: Self-pay | Admitting: Gastroenterology

## 2019-05-18 DIAGNOSIS — K219 Gastro-esophageal reflux disease without esophagitis: Secondary | ICD-10-CM | POA: Insufficient documentation

## 2019-05-18 DIAGNOSIS — R1319 Other dysphagia: Secondary | ICD-10-CM | POA: Insufficient documentation

## 2019-05-18 MED ORDER — ESOMEPRAZOLE MAGNESIUM 20 MG PO CPDR: 20.0000 mg | DELAYED_RELEASE_CAPSULE | Freq: Two times a day (BID) | ORAL | 3 refills | Status: DC

## 2019-05-18 NOTE — Telephone Encounter (Signed)
Pt stated that she picked up esomeprazole 20 mg from pharmacy but according to OV notes it stated to take esomeprazole 40 mg.  Please clarify with pt.

## 2019-05-18 NOTE — Telephone Encounter (Addendum)
Rx re-sent  to pharmacy. Pt has been informed.

## 2019-05-18 NOTE — Progress Notes (Addendum)
[place] VISIT   Primary Care Provider [address] [place], [place] [address] [phone]  Patient Profile: Denise Benton is a 69 y.o. female with a pmh significant for allergies, asthma, hyperlipidemia, osteopenia, nephrolithiasis, previous PUD, status post cholecystectomy for cholelithiasis with ERCP with sphincterotomy, GERD, ?IBS.  The patient presents to the [facility] for an evaluation and management of problem(s) noted below:  Problem List 1. Gastroesophageal reflux disease without esophagitis   2. Other dysphagia   3. Laryngopharyngeal reflux (LPR)     History of Present Illness Please see initial consultation note by NP Berniece Pap for full details of HPI.  Interval History Since her clinic visit with NP Berniece Pap, I performed an upper endoscopy for diagnostic purposes as well as an empiric dilation.  We initiated her on Nexium once daily and her GERD symptoms are significantly improved.  However, the upper throat symptoms remain even as her acid reflux changes have significantly improved.  She wonders about staying on PPI therapy for long-term issues and effects that could occur.  Not clear that the dilatation did anything for her.  Patient is eating well.  She is tolerating PPI at this time without significant diarrhea as she did not tolerate omeprazole per her report previously.  She brought her colonoscopy report from [facility] in 2012 which showed a normal colonoscopy with plan for 10-year follow-up we will have that scanned into the chart.  GI Review of Systems Positive as above Negative for odynophagia, vomiting, change in bowel habits, melena, hematochezia  Review of Systems General: Denies fevers/chills/weight loss HEENT: Denies oral lesions Cardiovascular: Denies chest pain/palpitations Pulmonary: Denies shortness of breath Gastroenterological: See HPI Genitourinary: Denies darkened  urine Hematological: Denies easy bruising/bleeding Dermatological: Denies jaundice Psychological: Mood is stable   Medications Current Outpatient Medications  Medication Sig Dispense Refill  . acetaminophen (TYLENOL) 325 MG tablet Take 325 mg by mouth as needed.    . Alum & Mag Hydroxide-Simeth (MYLANTA PO) Take by mouth as needed.     Marland Kitchen aspirin EC 81 MG tablet Take 81 mg by mouth once a week.    . B Complex Vitamins (VITAMIN-B COMPLEX PO) Take 1 tablet by mouth daily.    . Biotin 10000 MCG TABS Take by mouth daily.    . Calcium Carbonate (CALTRATE 600 PO) Take 1 tablet by mouth. Bi weekly    . Cholecalciferol (VITAMIN D) 50 MCG (2000 UT) CAPS Take by mouth daily.     . Docosahexaenoic Acid (DHA PO) Take 1 tablet by mouth once a week.     Marland Kitchen ELDERBERRY PO Take 1 Dose by mouth as needed.    . fluticasone-salmeterol (ADVAIR HFA) 115-21 MCG/ACT inhaler as needed.     . folic acid (FOLVITE) 102 MCG tablet Take 400 mcg by mouth once a week.     . levOCARNitine-E-Coenzyme Q10 (CO Q-10 PLUS L-CARNITINE PO) Take 1 tablet by mouth daily.    . Multiple Vitamin (MULTIVITAMIN ADULT PO) Take by mouth.    . Multiple Vitamins-Minerals (EMERGEN-C IMMUNE PO) Take 1 tablet by mouth as needed.    . Omega-3 Fatty Acids (EPA PO) Take 1 tablet by mouth 3 (three) times a week.     Marland Kitchen albuterol (VENTOLIN HFA) 108 (90 Base) MCG/ACT inhaler Inhale into the lungs.    Marland Kitchen esomeprazole (NEXIUM) 20 MG capsule Take 1 capsule (20 mg total) by mouth 2 (two) times daily before a meal. 60 capsule 3  .  sucralfate (CARAFATE) 1 g tablet Take 1 tablet (1 g total) by mouth 2 (two) times daily. (Patient not taking: Reported on 05/17/2019) 30 tablet 1   No current facility-administered medications for this visit.    Allergies Allergies  Allergen Reactions  . Ivp Dye [Iodinated Diagnostic Agents] Other (See Comments)    Respiratory distress  . Codeine Nausea Only  . Niaspan [Niacin Er] Rash and Other (See Comments)     Nightmares    Histories Past Medical History:  Diagnosis Date  . Allergy    seasonal  . Arthritis   . Asthma   . Duodenal ulcer   . Gallstones   . GERD (gastroesophageal reflux disease)   . HLD (hyperlipidemia)   . IBS (irritable bowel syndrome)   . Kidney stones   . Lumbosacral disc disease    L4-5, L5-S1  . Osteopenia    Past Surgical History:  Procedure Laterality Date  . BREAST CYST ASPIRATION Right    pt has cysts aspiration done for lumps  . CESAREAN SECTION  1980   x2; again in 1984  . LAPAROSCOPIC CHOLECYSTECTOMY  2000  . SKIN LESION EXCISION  07/2011   from scalp - keratosis   Social History   Socioeconomic History  . Marital status: Married    Spouse name: Fayrene Fearing   . Number of children: 2  . Years of education: 4 years college  . Highest education level: Bachelor's degree (e.g., BA, AB, BS)  Occupational History  . Occupation: Retired RN/NP  . Occupation: Works part time at [employer]  . Smoking status: Former Smoker    Packs/day: 0.50    Years: 1.00    Pack years: 0.50    Types: Cigarettes    Quit date: 01/13/1969    Years since quitting: 50.3  . Smokeless tobacco: Never Used  Substance and Sexual Activity  . Alcohol use: No  . Drug use: No  . Sexual activity: Not Currently  Other Topics Concern  . Not on file  Social History Narrative  . Not on file   Social Determinants of Health   Financial Resource Strain:   . Difficulty of Paying Living Expenses:   Food Insecurity:   . Worried About Programme researcher, broadcasting/film/video in the Last Year:   . Barista in the Last Year:   Transportation Needs:   . Freight forwarder (Medical):   Marland Kitchen Lack of Transportation (Non-Medical):   Physical Activity:   . Days of Exercise per Week:   . Minutes of Exercise per Session:   Stress:   . Feeling of Stress :   Social Connections:   . Frequency of Communication with Friends and Family:   . Frequency of Social Gatherings with Friends and Family:     . Attends Religious Services:   . Active Member of Clubs or Organizations:   . Attends Banker Meetings:   Marland Kitchen Marital Status:   Intimate Partner Violence:   . Fear of Current or Ex-Partner:   . Emotionally Abused:   Marland Kitchen Physically Abused:   . Sexually Abused:    Family History  Problem Relation Age of Onset  . Tremor Mother   . Hyperlipidemia Mother   . Skin cancer Mother   . Dementia Mother   . Congestive Heart Failure Father   . Diabetes Father   . Heart disease Father        congestive heart failure  . Multiple sclerosis Sister   .  Heart disease Sister   . Kidney Stones Sister   . Hypertension Daughter   . Anxiety disorder Daughter   . Diabetes Paternal Grandfather   . Tremor Sister   . Colon cancer Neg Hx   . Esophageal cancer Neg Hx   . Stomach cancer Neg Hx   . Rectal cancer Neg Hx   . Inflammatory bowel disease Neg Hx   . Liver disease Neg Hx   . Pancreatic cancer Neg Hx    I have reviewed her medical, social, and family history in detail and updated the electronic medical record as necessary.    PHYSICAL EXAMINATION  BP 110/70   Pulse 66   Temp 98.7 F (37.1 C)   Ht 5' (1.524 m)   Wt 114 lb (51.7 kg)   SpO2 98%   BMI 22.26 kg/m  Wt Readings from Last 3 Encounters:  05/17/19 114 lb (51.7 kg)  04/21/19 118 lb 3.2 oz (53.6 kg)  04/13/19 116 lb (52.6 kg)  GEN: NAD, appears stated age, doesn't appear chronically ill PSYCH: Cooperative, without pressured speech EYE: Conjunctivae pink, sclerae anicteric ENT: MMM CV: Nontachycardic RESP: No audible wheezing GI: No appreciable abnormality MSK/EXT: No significant lower extremity edema noted SKIN: No jaundice NEURO:  Alert & Oriented x 3, no focal deficits   REVIEW OF DATA  I reviewed the following data at the time of this encounter:  GI Procedures and Studies  March 2021 EGD - No gross lesions in esophagus. Biopsied for EoE. Dilation performed. - Gastritis. Biopsied. - Multiple gastric  polyps. Biopsied. - No gross lesions in the duodenal bulb, in the first portion of the duodenum and in the second portion of the duodenum. Pathology Diagnosis 1. Surgical [P], gastric bx - GASTRIC ANTRAL MUCOSA WITH NONSPECIFIC REACTIVE GASTROPATHY - GASTRIC OXYNTIC MUCOSA WITH NO SPECIFIC HISTOPATHOLOGIC CHANGES - WARTHIN STARRY STAIN IS NEGATIVE FOR HELICOBACTER PYLORI 2. Surgical [P], gastric polyps bx - FUNDIC GLAND POLYP(S) 3. Surgical [P], distal esophagus - ESOPHAGEAL SQUAMOUS MUCOSA WITH NO SPECIFIC HISTOPATHOLOGIC CHANGES - NEGATIVE FOR INCREASED INTRAEPITHELIAL EOSINOPHILS 4. Surgical [P], mid esophagus and distal esophagus - ESOPHAGEAL SQUAMOUS MUCOSA WITH NO SPECIFIC HISTOPATHOLOGIC CHANGES - NEGATIVE FOR INCREASED INTRAEPITHELIAL EOSINOPHILS  2012 outside colonoscopy Normal 10-year follow-up recommended This will be scanned into the chart  Laboratory Studies  Reviewed those in epic  Imaging Studies  Relevant studies to review   ASSESSMENT  Ms. Agresti is a 69 y.o. female with a pmh significant for allergies, asthma, hyperlipidemia, osteopenia, nephrolithiasis, previous PUD, status post cholecystectomy for cholelithiasis with ERCP with sphincterotomy, GERD, ?IBS.  The patient is seen today for evaluation and management of:  1. Gastroesophageal reflux disease without esophagitis   2. Other dysphagia   3. Laryngopharyngeal reflux (LPR)    The patient is hemodynamically stable.  Clinically, she is doing well from a GERD perspective with initiation of Nexium.  We will continue that for the next few months.  Eventually will like to decrease her dose to 20 mg daily and see if she can tolerate that.  Patient may want to get off of PPI therapy at some point in the future due to concerns of potential side effect profile.  Certainly we can consider the role of other modalities for GERD treatment since she has good effectiveness with treatment with PPI.  We briefly discussed TIF as  well as fundoplication as well as [place].  She is not interested in surgical management or endoscopic management at this time.  I do  not think that all of her upper throat symptoms are result of LPR at this time and she has had adequate acid therapy.  Would like her to have an opportunity to be evaluated by ENT here in [place] to see that particular thoughts in regards to potential repeat evaluation of her upper oropharynx and pharynx.  We will move forward with that.  She will be due for colon cancer screening in 2022.  We will see her back in a few months after ENT evaluation.  Certainly can consider esophageal manometry for the previous issues of potential dysphagia although symptoms are not overtly concerning for that.  All patient questions were answered, to the best of my ability, and the patient agrees to the aforementioned plan of action with follow-up as indicated.   PLAN  ENT referral placed Continue total of Nexium 40 mg daily for next few weeks until evaluated by ENT We will likely then transition to Nexium 20 mg daily to see how symptoms are doing on lower dose Hold on esophageal manometry and pH impedance at this time If any endoscopic or surgical management of GERD were to be considered we would have to complete manometry and pH impedance Colonoscopy for colon cancer screening recall placed for 2022   Orders Placed This Encounter  Procedures  . Ambulatory referral to ENT    New Prescriptions   ESOMEPRAZOLE (NEXIUM) 20 MG CAPSULE    Take 1 capsule (20 mg total) by mouth 2 (two) times daily before a meal.   Modified Medications   No medications on file    Planned Follow Up No follow-ups on file.   Total Time in Face-to-Face and in Coordination of Care for patient including independent/personal interpretation/review of prior testing, medical history, examination, medication adjustment, communicating results with the patient directly, and documentation with the EHR is 25  minutes.   Corliss Parish, MD [facility] Office # [phone]

## 2019-05-19 ENCOUNTER — Other Ambulatory Visit: Payer: Self-pay | Admitting: Family Medicine

## 2019-05-19 ENCOUNTER — Inpatient Hospital Stay
Admission: RE | Admit: 2019-05-19 | Discharge: 2019-05-19 | Disposition: A | Discharge status: Home / Self Care | Payer: Self-pay | Source: Ambulatory Visit | Attending: Family Medicine | Admitting: Family Medicine

## 2019-05-19 DIAGNOSIS — M79622 Pain in left upper arm: Secondary | ICD-10-CM

## 2019-05-19 DIAGNOSIS — N644 Mastodynia: Secondary | ICD-10-CM

## 2019-05-24 ENCOUNTER — Other Ambulatory Visit: Payer: Self-pay

## 2019-05-24 ENCOUNTER — Ambulatory Visit (HOSPITAL_COMMUNITY)
Admission: RE | Admit: 2019-05-24 | Discharge: 2019-05-24 | Disposition: A | Discharge status: Home / Self Care | Payer: Medicare Other | Source: Ambulatory Visit | Attending: Family Medicine | Admitting: Family Medicine

## 2019-05-24 DIAGNOSIS — N644 Mastodynia: Secondary | ICD-10-CM

## 2019-05-24 DIAGNOSIS — M79622 Pain in left upper arm: Secondary | ICD-10-CM

## 2019-05-31 ENCOUNTER — Telehealth: Payer: Self-pay | Admitting: Family Medicine

## 2019-05-31 NOTE — Telephone Encounter (Signed)
[email or web address]

## 2019-05-31 NOTE — Telephone Encounter (Signed)
Pt called to let Britney know that she had MM/US done on her left breast on 05/24/19. Says she was told that her lymph glands are enlarged. Also said that when she was seen for PO by Dr Meridee Score , she was recommended to go see ENT doctor. Pt wants advice on what to do next.

## 2019-05-31 NOTE — Telephone Encounter (Signed)
I am glad it is just a lymph node.   Dr. Meridee Score placed the referral to ENT already.

## 2019-06-10 NOTE — Telephone Encounter (Signed)
Patient aware and has appointment with ENT next week.

## 2019-06-15 ENCOUNTER — Ambulatory Visit (INDEPENDENT_AMBULATORY_CARE_PROVIDER_SITE_OTHER): Payer: Medicare Other | Admitting: Otolaryngology

## 2019-06-15 ENCOUNTER — Encounter (INDEPENDENT_AMBULATORY_CARE_PROVIDER_SITE_OTHER): Payer: Self-pay | Admitting: Otolaryngology

## 2019-06-15 ENCOUNTER — Other Ambulatory Visit: Payer: Self-pay

## 2019-06-15 VITALS — Temp 97.7°F

## 2019-06-15 DIAGNOSIS — R4702 Dysphasia: Secondary | ICD-10-CM

## 2019-06-15 NOTE — Progress Notes (Signed)
HPI: Denise Benton is a 69 y.o. female who presents is referred by Dr. Jess Barters for evaluation of swallowing problems.  Patient has had previous upper endoscopy which was clear.  She has been on omeprazole as well as more recently Nexium for over a year.  She has intermittent sore throat.  She feels like her lymph nodes are swollen although she had an ultrasound which did not demonstrate any significant lymphadenopathy.  She also occasionally spits out tonsil stones.  She stated that her swallowing problems started about a year ago.. She has had no significant weight loss.  She describes a sensation of thick mucus within her throat a lot.  She does not have any trouble swallowing water or liquids.  Past Medical History:  Diagnosis Date  . Allergy    seasonal  . Arthritis   . Asthma   . Duodenal ulcer   . Gallstones   . GERD (gastroesophageal reflux disease)   . HLD (hyperlipidemia)   . IBS (irritable bowel syndrome)   . Kidney stones   . Lumbosacral disc disease    L4-5, L5-S1  . Osteopenia    Past Surgical History:  Procedure Laterality Date  . BREAST CYST ASPIRATION Right    pt has cysts aspiration done for lumps  . CESAREAN SECTION  1980   x2; again in 1984  . LAPAROSCOPIC CHOLECYSTECTOMY  2000  . SKIN LESION EXCISION  07/2011   from scalp - keratosis   Social History   Socioeconomic History  . Marital status: Married    Spouse name: Fayrene Fearing   . Number of children: 2  . Years of education: 4 years college  . Highest education level: Bachelor's degree (e.g., BA, AB, BS)  Occupational History  . Occupation: Retired RN/NP  . Occupation: Works part time at [employer]  . Smoking status: Former Smoker    Packs/day: 0.50    Years: 1.00    Pack years: 0.50    Types: Cigarettes    Quit date: 01/13/1969    Years since quitting: 50.4  . Smokeless tobacco: Never Used  Substance and Sexual Activity  . Alcohol use: No  . Drug use: No  . Sexual activity: Not  Currently  Other Topics Concern  . Not on file  Social History Narrative  . Not on file   Social Determinants of Health   Financial Resource Strain:   . Difficulty of Paying Living Expenses:   Food Insecurity:   . Worried About Programme researcher, broadcasting/film/video in the Last Year:   . Barista in the Last Year:   Transportation Needs:   . Freight forwarder (Medical):   Marland Kitchen Lack of Transportation (Non-Medical):   Physical Activity:   . Days of Exercise per Week:   . Minutes of Exercise per Session:   Stress:   . Feeling of Stress :   Social Connections:   . Frequency of Communication with Friends and Family:   . Frequency of Social Gatherings with Friends and Family:   . Attends Religious Services:   . Active Member of Clubs or Organizations:   . Attends Banker Meetings:   Marland Kitchen Marital Status:    Family History  Problem Relation Age of Onset  . Tremor Mother   . Hyperlipidemia Mother   . Skin cancer Mother   . Dementia Mother   . Congestive Heart Failure Father   . Diabetes Father   . Heart disease Father  congestive heart failure  . Multiple sclerosis Sister   . Heart disease Sister   . Kidney Stones Sister   . Hypertension Daughter   . Anxiety disorder Daughter   . Diabetes Paternal Grandfather   . Tremor Sister   . Colon cancer Neg Hx   . Esophageal cancer Neg Hx   . Stomach cancer Neg Hx   . Rectal cancer Neg Hx   . Inflammatory bowel disease Neg Hx   . Liver disease Neg Hx   . Pancreatic cancer Neg Hx    Allergies  Allergen Reactions  . Ivp Dye [Iodinated Diagnostic Agents] Other (See Comments)    Respiratory distress  . Codeine Nausea Only  . Niaspan [Niacin Er] Rash and Other (See Comments)    Nightmares   Prior to Admission medications   Medication Sig Start Date End Date Taking? Authorizing Provider  acetaminophen (TYLENOL) 325 MG tablet Take 325 mg by mouth as needed.   Yes [provider]  Alum & Mag Hydroxide-Simeth  (MYLANTA PO) Take by mouth as needed.    Yes [provider]  aspirin EC 81 MG tablet Take 81 mg by mouth once a week.   Yes [provider]  B Complex Vitamins (VITAMIN-B COMPLEX PO) Take 1 tablet by mouth daily.   Yes [provider]  Biotin 10000 MCG TABS Take by mouth daily.   Yes [provider]  Calcium Carbonate (CALTRATE 600 PO) Take 1 tablet by mouth. Bi weekly   Yes [provider]  Cholecalciferol (VITAMIN D) 50 MCG (2000 UT) CAPS Take by mouth daily.    Yes [provider]  Docosahexaenoic Acid (DHA PO) Take 1 tablet by mouth once a week.    Yes [provider]  ELDERBERRY PO Take 1 Dose by mouth as needed.   Yes [provider]  esomeprazole (NEXIUM) 20 MG capsule Take 1 capsule (20 mg total) by mouth 2 (two) times daily before a meal. 05/18/19  Yes Mansouraty, Telford Nab., MD  fluticasone-salmeterol (ADVAIR [facility]) 161-09 MCG/ACT inhaler as needed.  11/22/18  Yes [provider]  folic acid (FOLVITE) 604 MCG tablet Take 400 mcg by mouth once a week.    Yes [provider]  levOCARNitine-E-Coenzyme Q10 (CO Q-10 PLUS L-CARNITINE PO) Take 1 tablet by mouth daily.   Yes [provider]  Multiple Vitamin (MULTIVITAMIN ADULT PO) Take by mouth.   Yes [provider]  Multiple Vitamins-Minerals (EMERGEN-C IMMUNE PO) Take 1 tablet by mouth as needed.   Yes [provider]  Omega-3 Fatty Acids (EPA PO) Take 1 tablet by mouth 3 (three) times a week.    Yes [provider]  sucralfate (CARAFATE) 1 g tablet Take 1 tablet (1 g total) by mouth 2 (two) times daily. 03/22/19  Yes Noralyn Pick, NP  albuterol (VENTOLIN HFA) 108 (90 Base) MCG/ACT inhaler Inhale into the lungs. 05/01/18 05/01/19  [provider]     Positive ROS: Otherwise negative  All other systems have been reviewed and were otherwise negative with the exception of those mentioned in the HPI and  as above.  Physical Exam: Constitutional: Alert, well-appearing, no acute distress Ears: External ears without lesions or tenderness. Ear canals are clear bilaterally with intact, clear TMs.  Nasal: External nose without lesions. Septum midline with mild rhinitis and clear mucus discharge.  Both middle meatus regions are clear with no signs of infection.. Oral: Lips and gums without lesions. Tongue and palate mucosa without  lesions. Posterior oropharynx clear.  Tonsils are small bilaterally with no lesions noted.  On palpation of the tonsils the right tonsil was little bit sore to palpation compared to the left. Fiberoptic laryngoscopy was performed through the left nostril.  The nasopharynx was clear.  The base of tongue vallecula and epiglottis were normal.  Vocal cords were clear bilaterally with normal vocal mobility.  Both piriform sinuses were clear with no abnormal hypopharyngeal mucosal problems noted. Neck: No palpable adenopathy or masses noted on either side of the neck. Respiratory: Breathing comfortably  Skin: No facial/neck lesions or rash noted.  Laryngoscopy  Date/Time: 06/15/2019 2:54 PM Performed by: Drema Halon, MD Authorized by: Drema Halon, MD   Consent:    Consent obtained:  Verbal   Consent given by:  Patient Procedure details:    Indications: hoarseness, dysphagia, or aspiration     Medication:  Afrin   Instrument: flexible fiberoptic laryngoscope     Scope location: left nare   Sinus:    Left nasopharynx: normal   Mouth:    Oropharynx: normal     Vallecula: normal     Base of tongue: normal     Epiglottis: normal   Throat:    Right hypopharynx: normal     Left hypopharynx: normal     Pyriform sinus: normal     True vocal cords: normal   Comments:     On fiberoptic laryngoscopy the hypopharynx and larynx was clear to evaluation with no structural abnormalities noted.    Assessment: Dysphagia questionable etiology. Mild tonsil  problems but I do not feel like this is causing difficulty swallowing although it may cause some slight discomfort and pain when she swallows.  Plan: We will plan on scheduling patient for a modified barium swallow to see if there is any functional or structural problems with her swallowing. If she has frequent sore throats could consider tonsillectomy however I also discussed the morbidity associated with removing the tonsils. She will call us following the modified barium swallow concerning results.   Narda Bonds, MD   CC:

## 2019-06-17 ENCOUNTER — Other Ambulatory Visit (HOSPITAL_COMMUNITY): Payer: Self-pay

## 2019-06-17 DIAGNOSIS — R633 Feeding difficulties, unspecified: Secondary | ICD-10-CM

## 2019-06-17 DIAGNOSIS — R1310 Dysphagia, unspecified: Secondary | ICD-10-CM

## 2019-06-20 ENCOUNTER — Ambulatory Visit (INDEPENDENT_AMBULATORY_CARE_PROVIDER_SITE_OTHER): Payer: Medicare Other | Admitting: Family Medicine

## 2019-06-20 ENCOUNTER — Encounter: Payer: Self-pay | Admitting: Family Medicine

## 2019-06-20 DIAGNOSIS — R399 Unspecified symptoms and signs involving the genitourinary system: Secondary | ICD-10-CM | POA: Diagnosis not present

## 2019-06-20 MED ORDER — CEPHALEXIN 500 MG PO CAPS: 500.0000 mg | ORAL_CAPSULE | Freq: Four times a day (QID) | ORAL | 0 refills | Status: DC

## 2019-06-20 NOTE — Progress Notes (Signed)
Virtual Visit via telephone Note  I connected with [name] on 06/20/19 at [phone] by telephone and verified that I am speaking with the correct person using two identifiers. [place] L Sampley is currently located at home and no other people are currently with her during visit. The provider, Fransisca Kaufmann Jency Schnieders, MD is located in their office at time of visit.  Call ended at [phone]  I discussed the limitations, risks, security and privacy concerns of performing an evaluation and management service by telephone and the availability of in person appointments. I also discussed with the patient that there may be a patient responsible charge related to this service. The patient expressed understanding and agreed to proceed.   History and Present Illness: Patient is calling in with 4-5 days of lower abdominal pain and frequency and dysuria.  She is increasing fluids.  She denies any fevers or chills. She normally has back pain but it is slightly worse. She denies vaginal discharge or bleeding.   No diagnosis found.  Outpatient Encounter Medications as of 06/20/2019  Medication Sig  . acetaminophen (TYLENOL) 325 MG tablet Take 325 mg by mouth as needed.  Marland Kitchen albuterol (VENTOLIN HFA) 108 (90 Base) MCG/ACT inhaler Inhale into the lungs.  . Alum & Mag Hydroxide-Simeth (MYLANTA PO) Take by mouth as needed.   Marland Kitchen aspirin EC 81 MG tablet Take 81 mg by mouth once a week.  . B Complex Vitamins (VITAMIN-B COMPLEX PO) Take 1 tablet by mouth daily.  . Biotin 10000 MCG TABS Take by mouth daily.  . Calcium Carbonate (CALTRATE 600 PO) Take 1 tablet by mouth. Bi weekly  . Cholecalciferol (VITAMIN D) 50 MCG (2000 UT) CAPS Take by mouth daily.   . Docosahexaenoic Acid (DHA PO) Take 1 tablet by mouth once a week.   Marland Kitchen ELDERBERRY PO Take 1 Dose by mouth as needed.  Marland Kitchen esomeprazole (NEXIUM) 20 MG capsule Take 1 capsule (20 mg total) by mouth 2 (two) times daily before a meal.  . fluticasone-salmeterol (ADVAIR HFA)  115-21 MCG/ACT inhaler as needed.   . folic acid (FOLVITE) 627 MCG tablet Take 400 mcg by mouth once a week.   . levOCARNitine-E-Coenzyme Q10 (CO Q-10 PLUS L-CARNITINE PO) Take 1 tablet by mouth daily.  . Multiple Vitamin (MULTIVITAMIN ADULT PO) Take by mouth.  . Multiple Vitamins-Minerals (EMERGEN-C IMMUNE PO) Take 1 tablet by mouth as needed.  . Omega-3 Fatty Acids (EPA PO) Take 1 tablet by mouth 3 (three) times a week.   . sucralfate (CARAFATE) 1 g tablet Take 1 tablet (1 g total) by mouth 2 (two) times daily.   No facility-administered encounter medications on file as of 06/20/2019.    Review of Systems  Constitutional: Negative for chills and fever.  Eyes: Negative for visual disturbance.  Respiratory: Negative for chest tightness and shortness of breath.   Cardiovascular: Negative for chest pain and leg swelling.  Gastrointestinal: Positive for abdominal pain.  Genitourinary: Positive for dysuria, frequency and urgency. Negative for difficulty urinating, hematuria, vaginal bleeding, vaginal discharge and vaginal pain.  Musculoskeletal: Negative for back pain and gait problem.  Skin: Negative for rash.  Neurological: Negative for light-headedness and headaches.  Psychiatric/Behavioral: Negative for agitation and behavioral problems.  All other systems reviewed and are negative.   Observations/Objective: Patient sounds comfortable and in no acute distress  Assessment and Plan: Problem List Items Addressed This Visit    None    Visit Diagnoses    UTI symptoms    -  Primary   Relevant Medications   cephALEXin (KEFLEX) 500 MG capsule      Will treat based off of symptoms, she is not 1 who gets it frequently Follow up plan: Return if symptoms worsen or fail to improve.     I discussed the assessment and treatment plan with the patient. The patient was provided an opportunity to ask questions and all were answered. The patient agreed with the plan and demonstrated an  understanding of the instructions.   The patient was advised to call back or seek an in-person evaluation if the symptoms worsen or if the condition fails to improve as anticipated.  The above assessment and management plan was discussed with the patient. The patient verbalized understanding of and has agreed to the management plan. Patient is aware to call the clinic if symptoms persist or worsen. Patient is aware when to return to the clinic for a follow-up visit. Patient educated on when it is appropriate to go to the emergency department.    I provided 6 minutes of non-face-to-face time during this encounter.    Nils Pyle, MD

## 2019-06-24 ENCOUNTER — Encounter: Payer: Self-pay | Admitting: Family Medicine

## 2019-06-24 ENCOUNTER — Ambulatory Visit (INDEPENDENT_AMBULATORY_CARE_PROVIDER_SITE_OTHER): Payer: Medicare Other | Admitting: Family Medicine

## 2019-06-24 DIAGNOSIS — R399 Unspecified symptoms and signs involving the genitourinary system: Secondary | ICD-10-CM

## 2019-06-24 LAB — MICROSCOPIC EXAMINATION
Bacteria, UA: NONE SEEN
Renal Epithel, UA: NONE SEEN /hpf

## 2019-06-24 LAB — URINALYSIS, COMPLETE
Bilirubin, UA: NEGATIVE
Glucose, UA: NEGATIVE
Ketones, UA: NEGATIVE
Leukocytes,UA: NEGATIVE
Nitrite, UA: NEGATIVE
Protein,UA: NEGATIVE
Specific Gravity, UA: 1.025 (ref 1.005–1.030)
Urobilinogen, Ur: 0.2 mg/dL (ref 0.2–1.0)
pH, UA: 5.5 (ref 5.0–7.5)

## 2019-06-24 MED ORDER — CIPROFLOXACIN HCL 500 MG PO TABS: 500.0000 mg | ORAL_TABLET | Freq: Two times a day (BID) | ORAL | 0 refills | Status: AC

## 2019-06-24 MED ORDER — PHENAZOPYRIDINE HCL 200 MG PO TABS: 200.0000 mg | ORAL_TABLET | Freq: Three times a day (TID) | ORAL | 0 refills | Status: DC | PRN

## 2019-06-24 NOTE — Progress Notes (Signed)
Virtual Visit via Telephone Note  I connected with Denise Benton on 06/24/19 at 5:13 PM by telephone and verified that I am speaking with the correct person using two identifiers. Denise Benton is currently located at home and nobody is currently with her during this visit. The provider, Gwenlyn Fudge, FNP is located in their office at time of visit.  I discussed the limitations, risks, security and privacy concerns of performing an evaluation and management service by telephone and the availability of in person appointments. I also discussed with the patient that there may be a patient responsible charge related to this service. The patient expressed understanding and agreed to proceed.  Subjective: PCP: Gwenlyn Fudge, FNP  Chief Complaint  Patient presents with  . Urinary Tract Infection   Patient was treated for UTI symptoms on 06/20/2019 with Keflex 500 mg 4 times daily x7 days.  She reports no improvement in her symptoms of low back pain, dysuria, bladder pain, and pelvic area pain.  She did not leave a urine specimen when she was previously treated for a urine culture to have been completed.   ROS: Per HPI  Current Outpatient Medications:  .  acetaminophen (TYLENOL) 325 MG tablet, Take 325 mg by mouth as needed., Disp: , Rfl:  .  albuterol (VENTOLIN HFA) 108 (90 Base) MCG/ACT inhaler, Inhale into the lungs., Disp: , Rfl:  .  Alum & Mag Hydroxide-Simeth (MYLANTA PO), Take by mouth as needed. , Disp: , Rfl:  .  aspirin EC 81 MG tablet, Take 81 mg by mouth once a week., Disp: , Rfl:  .  B Complex Vitamins (VITAMIN-B COMPLEX PO), Take 1 tablet by mouth daily., Disp: , Rfl:  .  Biotin 57322 MCG TABS, Take by mouth daily., Disp: , Rfl:  .  Calcium Carbonate (CALTRATE 600 PO), Take 1 tablet by mouth. Bi weekly, Disp: , Rfl:  .  cephALEXin (KEFLEX) 500 MG capsule, Take 1 capsule (500 mg total) by mouth 4 (four) times daily., Disp: 28 capsule, Rfl: 0 .  Cholecalciferol (VITAMIN  D) 50 MCG (2000 UT) CAPS, Take by mouth daily. , Disp: , Rfl:  .  ciprofloxacin (CIPRO) 500 MG tablet, Take 1 tablet (500 mg total) by mouth 2 (two) times daily for 7 days., Disp: 14 tablet, Rfl: 0 .  Docosahexaenoic Acid (DHA PO), Take 1 tablet by mouth once a week. , Disp: , Rfl:  .  ELDERBERRY PO, Take 1 Dose by mouth as needed., Disp: , Rfl:  .  esomeprazole (NEXIUM) 20 MG capsule, Take 1 capsule (20 mg total) by mouth 2 (two) times daily before a meal., Disp: 60 capsule, Rfl: 3 .  fluticasone-salmeterol (ADVAIR HFA) 115-21 MCG/ACT inhaler, as needed. , Disp: , Rfl:  .  folic acid (FOLVITE) 800 MCG tablet, Take 400 mcg by mouth once a week. , Disp: , Rfl:  .  levOCARNitine-E-Coenzyme Q10 (CO Q-10 PLUS L-CARNITINE PO), Take 1 tablet by mouth daily., Disp: , Rfl:  .  Multiple Vitamin (MULTIVITAMIN ADULT PO), Take by mouth., Disp: , Rfl:  .  Multiple Vitamins-Minerals (EMERGEN-C IMMUNE PO), Take 1 tablet by mouth as needed., Disp: , Rfl:  .  Omega-3 Fatty Acids (EPA PO), Take 1 tablet by mouth 3 (three) times a week. , Disp: , Rfl:  .  phenazopyridine (PYRIDIUM) 200 MG tablet, Take 1 tablet (200 mg total) by mouth 3 (three) times daily as needed for pain. Do not use for more than 2 days.,  Disp: 10 tablet, Rfl: 0 .  sucralfate (CARAFATE) 1 g tablet, Take 1 tablet (1 g total) by mouth 2 (two) times daily., Disp: 30 tablet, Rfl: 1  Allergies  Allergen Reactions  . Ivp Dye [Iodinated Diagnostic Agents] Other (See Comments)    Respiratory distress  . Codeine Nausea Only  . Niaspan [Niacin Er] Rash and Other (See Comments)    Nightmares   Past Medical History:  Diagnosis Date  . Allergy    seasonal  . Arthritis   . Asthma   . Duodenal ulcer   . Gallstones   . GERD (gastroesophageal reflux disease)   . HLD (hyperlipidemia)   . IBS (irritable bowel syndrome)   . Kidney stones   . Lumbosacral disc disease    L4-5, L5-S1  . Osteopenia     Observations/Objective: A&O  No respiratory  distress or wheezing audible over the phone Mood, judgement, and thought processes all WNL   Assessment and Plan: 1. UTI symptoms - Patient is continuing with symptoms.  We will change her antibiotic from Keflex to Cipro.  I also added Pyridium to help with the discomfort.  If she is not having any improvement in the next 72 hours she will let me know. - Urine Culture - Urinalysis, Complete: Urine dipstick shows positive for RBC's.  Micro exam: 0-5 WBC's per HPF and 3-10 RBC's per HPF. - ciprofloxacin (CIPRO) 500 MG tablet; Take 1 tablet (500 mg total) by mouth 2 (two) times daily for 7 days.  Dispense: 14 tablet; Refill: 0 - phenazopyridine (PYRIDIUM) 200 MG tablet; Take 1 tablet (200 mg total) by mouth 3 (three) times daily as needed for pain. Do not use for more than 2 days.  Dispense: 10 tablet; Refill: 0   Follow Up Instructions:  I discussed the assessment and treatment plan with the patient. The patient was provided an opportunity to ask questions and all were answered. The patient agreed with the plan and demonstrated an understanding of the instructions.   The patient was advised to call back or seek an in-person evaluation if the symptoms worsen or if the condition fails to improve as anticipated.  The above assessment and management plan was discussed with the patient. The patient verbalized understanding of and has agreed to the management plan. Patient is aware to call the clinic if symptoms persist or worsen. Patient is aware when to return to the clinic for a follow-up visit. Patient educated on when it is appropriate to go to the emergency department.   Time call ended: 5:21 PM  I provided 10 minutes of non-face-to-face time during this encounter.  [name], MSN, APRN, FNP-C [facility] 06/24/19

## 2019-06-26 LAB — URINE CULTURE: Organism ID, Bacteria: NO GROWTH

## 2019-06-29 ENCOUNTER — Ambulatory Visit (HOSPITAL_COMMUNITY)
Admission: RE | Admit: 2019-06-29 | Discharge: 2019-06-29 | Disposition: A | Discharge status: Home / Self Care | Payer: Medicare Other | Source: Ambulatory Visit | Attending: Otolaryngology | Admitting: Otolaryngology

## 2019-06-29 ENCOUNTER — Other Ambulatory Visit: Payer: Self-pay

## 2019-06-29 ENCOUNTER — Telehealth: Payer: Self-pay | Admitting: Family Medicine

## 2019-06-29 DIAGNOSIS — R633 Feeding difficulties, unspecified: Secondary | ICD-10-CM

## 2019-06-29 DIAGNOSIS — R1310 Dysphagia, unspecified: Secondary | ICD-10-CM | POA: Diagnosis not present

## 2019-06-29 DIAGNOSIS — R1312 Dysphagia, oropharyngeal phase: Secondary | ICD-10-CM | POA: Insufficient documentation

## 2019-06-29 DIAGNOSIS — R4702 Dysphasia: Secondary | ICD-10-CM

## 2019-06-29 NOTE — Therapy (Signed)
Modified Barium Swallow Progress Note  Patient Details  Name: Denise Benton MRN: [id number] Date of Birth: 26-Dec-1950  Today's Date: 06/29/2019  Modified Barium Swallow completed.  Full report located under Chart Review in the Imaging Section.  Brief recommendations include the following:  Clinical Impression Pt presents with normal oropharyngeal swallow function. Oral prep and propulsion was timely. Pharyngeal swallow trigger timely, with occasional trace residue which pt sensed and swallowed again to clear. No penetration or aspiration. Esophageal sweep revealed no stasis. No swelling or abnormal anatomy to explain globus sensation. Given recent diagnosis of LPR, pt was given written behavioral and dietary strategies for management of esophageal dysmotility. No further ST intervention recommended at this time.   Swallow Evaluation Recommendations Behavioral and Dietary Strategies for Management of Esophageal Dysmotility 1. Take reflux medications 30+ minutes before other po intake, in the morning 2. Begin meals with warm beverage 3. Eat smaller more frequent meals 4. Eat slowly, taking small bites and sips 5. Alternate solids and liquids 6. Avoid foods/liquids that increase acid production 7. Sit upright during and for 30+ minutes after meals to facilitate esophageal clearing   SLP Diet Recommendations: Regular solids;Thin liquid   Liquid Administration via: Cup;Straw   Medication Administration: Whole meds with liquid   Supervision: Patient able to self feed   Compensations: Slow rate;Small sips/bites;Follow solids with liquid   Postural Changes: Remain semi-upright after after feeds/meals (Comment);Seated upright at 90 degrees   Oral Care Recommendations: Oral care BID     Denise Benton B. Murvin Natal, [facility], CCC-SLP Speech Language Pathologist Office: [phone] Pager: [phone]  Leigh Aurora 06/29/2019,12:45 PM

## 2019-09-09 ENCOUNTER — Other Ambulatory Visit (HOSPITAL_COMMUNITY): Payer: Self-pay | Admitting: Family Medicine

## 2019-09-09 DIAGNOSIS — Z1231 Encounter for screening mammogram for malignant neoplasm of breast: Secondary | ICD-10-CM

## 2019-10-14 ENCOUNTER — Encounter: Payer: Self-pay | Admitting: Family Medicine

## 2019-10-14 ENCOUNTER — Other Ambulatory Visit: Payer: Self-pay

## 2019-10-14 ENCOUNTER — Ambulatory Visit (INDEPENDENT_AMBULATORY_CARE_PROVIDER_SITE_OTHER): Payer: Medicare Other | Admitting: Family Medicine

## 2019-10-14 VITALS — BP 127/77 | HR 64 | Temp 97.3°F | Ht 60.0 in | Wt 119.8 lb

## 2019-10-14 DIAGNOSIS — R0602 Shortness of breath: Secondary | ICD-10-CM | POA: Diagnosis not present

## 2019-10-14 DIAGNOSIS — L989 Disorder of the skin and subcutaneous tissue, unspecified: Secondary | ICD-10-CM

## 2019-10-14 DIAGNOSIS — L821 Other seborrheic keratosis: Secondary | ICD-10-CM

## 2019-10-14 MED ORDER — SPIRIVA RESPIMAT 1.25 MCG/ACT IN AERS: 2.0000 | INHALATION_SPRAY | Freq: Every day | RESPIRATORY_TRACT | 2 refills | Status: DC

## 2019-10-14 NOTE — Progress Notes (Signed)
Assessment & Plan:  1. Skin lesion of face - Ambulatory referral to Dermatology  2. Seborrheic keratoses - Reassurance provided.   3. Shortness of breath - Patient to try Spiriva daily to prevent symptoms. She also has Albuterol as needed.  - Tiotropium Bromide Monohydrate (SPIRIVA RESPIMAT) 1.25 MCG/ACT AERS; Inhale 2 puffs into the lungs daily.  Dispense: 1 each; Refill: 2   Follow up plan: Return as scheduled.  [name], MSN, APRN, FNP-C [facility]  Subjective:   Patient ID: Denise Benton, female    DOB: September 30, 1950, 69 y.o.   MRN: [id number]  HPI: Denise Benton is a 69 y.o. female presenting on 10/14/2019 for Nevus (Patient states that she has a few moles that she wants looked at.  States they have been there for a few months but are changing.)  Patient reports she has a few moles that she would like looked at as she feels they are changing. 1.  On the left side of her forehead is a skin lesion that she reports is enlarging and itches.  She also states that it gets crusty and hard. 2.  She has 3 places on her torso that she would like looked at.  They do not itch.   Patient also reports she got her first COVID-19 vaccine which resulted in sweats, flashing lights, and numbness in her arm.  She has no intention of getting the second vaccine.  She also reports that she has had to have prednisone and inhalers since she was sick at the end of 2019.  She has a prescription currently for an Advair inhaler but does not use it regularly since it has a steroid in it and she has osteopenia.   ROS: Negative unless specifically indicated above in HPI.   Relevant past medical history reviewed and updated as indicated.   Allergies and medications reviewed and updated.   Current Outpatient Medications:  .  acetaminophen (TYLENOL) 325 MG tablet, Take 325 mg by mouth as needed., Disp: , Rfl:  .  Alum & Mag Hydroxide-Simeth (MYLANTA PO), Take by mouth as  needed. , Disp: , Rfl:  .  aspirin EC 81 MG tablet, Take 81 mg by mouth once a week., Disp: , Rfl:  .  B Complex Vitamins (VITAMIN-B COMPLEX PO), Take 1 tablet by mouth daily., Disp: , Rfl:  .  Biotin 79390 MCG TABS, Take by mouth daily., Disp: , Rfl:  .  Calcium Carbonate (CALTRATE 600 PO), Take 1 tablet by mouth. Bi weekly, Disp: , Rfl:  .  Cholecalciferol (VITAMIN D) 50 MCG (2000 UT) CAPS, Take by mouth daily. , Disp: , Rfl:  .  Docosahexaenoic Acid (DHA PO), Take 1 tablet by mouth once a week. , Disp: , Rfl:  .  ELDERBERRY PO, Take 1 Dose by mouth as needed., Disp: , Rfl:  .  esomeprazole (NEXIUM) 20 MG capsule, Take 1 capsule (20 mg total) by mouth 2 (two) times daily before a meal., Disp: 60 capsule, Rfl: 3 .  fluticasone-salmeterol (ADVAIR HFA) 115-21 MCG/ACT inhaler, as needed. , Disp: , Rfl:  .  folic acid (FOLVITE) 800 MCG tablet, Take 400 mcg by mouth once a week. , Disp: , Rfl:  .  levOCARNitine-E-Coenzyme Q10 (CO Q-10 PLUS L-CARNITINE PO), Take 1 tablet by mouth daily., Disp: , Rfl:  .  Multiple Vitamin (MULTIVITAMIN ADULT PO), Take by mouth., Disp: , Rfl:  .  Multiple Vitamins-Minerals (EMERGEN-C IMMUNE PO), Take 1 tablet by mouth as  needed., Disp: , Rfl:  .  Omega-3 Fatty Acids (EPA PO), Take 1 tablet by mouth 3 (three) times a week. , Disp: , Rfl:  .  phenazopyridine (PYRIDIUM) 200 MG tablet, Take 1 tablet (200 mg total) by mouth 3 (three) times daily as needed for pain. Do not use for more than 2 days., Disp: 10 tablet, Rfl: 0 .  sucralfate (CARAFATE) 1 g tablet, Take 1 tablet (1 g total) by mouth 2 (two) times daily., Disp: 30 tablet, Rfl: 1 .  albuterol (VENTOLIN HFA) 108 (90 Base) MCG/ACT inhaler, Inhale into the lungs., Disp: , Rfl:   Allergies  Allergen Reactions  . Ivp Dye [Iodinated Diagnostic Agents] Other (See Comments)    Respiratory distress  . Codeine Nausea Only  . Niaspan [Niacin Er] Rash and Other (See Comments)    Nightmares    Objective:   BP 127/77    Pulse 64   Temp (!) 97.3 F (36.3 C) (Temporal)   Ht 5' (1.524 m)   Wt 119 lb 12.8 oz (54.3 kg)   SpO2 97%   BMI 23.40 kg/m    Physical Exam Vitals reviewed.  Constitutional:      General: She is not in acute distress.    Appearance: Normal appearance. She is not ill-appearing, toxic-appearing or diaphoretic.  HENT:     Head: Normocephalic and atraumatic.  Eyes:     General: No scleral icterus.       Right eye: No discharge.        Left eye: No discharge.     Conjunctiva/sclera: Conjunctivae normal.  Cardiovascular:     Rate and Rhythm: Normal rate.  Pulmonary:     Effort: Pulmonary effort is normal. No respiratory distress.  Musculoskeletal:        General: Normal range of motion.     Cervical back: Normal range of motion.  Skin:    General: Skin is warm and dry.     Capillary Refill: Capillary refill takes less than 2 seconds.     Comments: Three seborrheic keratoses to her torso. The lesion to the left side of her forehead is flaky and hard.   Neurological:     General: No focal deficit present.     Mental Status: She is alert and oriented to person, place, and time. Mental status is at baseline.  Psychiatric:        Mood and Affect: Mood normal.        Behavior: Behavior normal.        Thought Content: Thought content normal.        Judgment: Judgment normal.

## 2019-10-16 ENCOUNTER — Encounter: Payer: Self-pay | Admitting: Family Medicine

## 2019-11-01 ENCOUNTER — Ambulatory Visit: Payer: Medicare Other | Admitting: Family Medicine

## 2019-11-08 ENCOUNTER — Encounter: Payer: Self-pay | Admitting: Nurse Practitioner

## 2019-11-08 ENCOUNTER — Other Ambulatory Visit: Payer: Self-pay

## 2019-11-08 ENCOUNTER — Ambulatory Visit (INDEPENDENT_AMBULATORY_CARE_PROVIDER_SITE_OTHER): Payer: Medicare Other | Admitting: Nurse Practitioner

## 2019-11-08 VITALS — BP 112/69 | HR 63 | Temp 97.1°F | Ht 61.0 in | Wt 116.6 lb

## 2019-11-08 DIAGNOSIS — Z23 Encounter for immunization: Secondary | ICD-10-CM | POA: Insufficient documentation

## 2019-11-08 DIAGNOSIS — K219 Gastro-esophageal reflux disease without esophagitis: Secondary | ICD-10-CM | POA: Diagnosis not present

## 2019-11-08 DIAGNOSIS — Z0000 Encounter for general adult medical examination without abnormal findings: Secondary | ICD-10-CM | POA: Insufficient documentation

## 2019-11-08 NOTE — Assessment & Plan Note (Signed)
Patient is a 69 year old female who presents to clinic for annual physical exam.  Completed head to toe assessment.  Patient has no new concerns.  Provided education on preventative care and health maintenance.  Printed handouts given. Follow-up in 1 year. Labs completed today CMP, lipid panel.-Lab results pending CBC was completed 6/21.

## 2019-11-08 NOTE — Assessment & Plan Note (Signed)
GERD well managed on current medication no changes necessary to medication dose.  Continue maintain healthy diet and exercise regimen as tolerated.  Provided education with printed handouts given. Follow-up with unresolved or worsening symptoms.

## 2019-11-08 NOTE — Progress Notes (Signed)
Established Patient Office Visit  Subjective:  Patient ID: Denise Benton, female    DOB: 1950/02/23  Age: 69 y.o. MRN: [id number]  CC:  Chief Complaint  Patient presents with  . Annual Exam    HPI [name] presents for .  Encounter for general adult medical examination without abnormal findings  Physical : Patient's last physical exam was 1 year ago .  Weight: Appropriate for height (BMI less than 27%) ;  Blood Pressure: Normal (BP less than 120/80) ;  Medical History: Patient history reviewed ; Family history reviewed ;  Allergies Reviewed: No change in current allergies ;  Medications Reviewed: Medications reviewed - no changes ;  Lipids: Normal lipid levels ;  Smoking: Life-long non-smoker ;  Physical Activity: Exercises at least 3 times per week ;  Alcohol/Drug Use: Is a non-drinker ; No illicit drug use ;  Patient is not afflicted from Stress Incontinence and Urge Incontinence  Safety: reviewed ; Patient wears a seat belt, has smoke detectors, has carbon monoxide detectors, practices appropriate gun safety, and wears sunscreen with extended sun exposure. Dental Care: biannual cleanings, brushes and flosses daily. Ophthalmology/Optometry: Annual visit.  Hearing loss: none Vision impairments: none prescription  Past Medical History:  Diagnosis Date  . Allergy    seasonal  . Arthritis   . Asthma   . Duodenal ulcer   . Gallstones   . GERD (gastroesophageal reflux disease)   . HLD (hyperlipidemia)   . IBS (irritable bowel syndrome)   . Kidney stones   . Lumbosacral disc disease    L4-5, L5-S1  . Osteopenia     Past Surgical History:  Procedure Laterality Date  . BREAST CYST ASPIRATION Right    pt has cysts aspiration done for lumps  . CESAREAN SECTION  1980   x2; again in 1984  . LAPAROSCOPIC CHOLECYSTECTOMY  2000  . SKIN LESION EXCISION  07/2011   from scalp - keratosis    Family History  Problem Relation Age of Onset  . Tremor Mother   .  Hyperlipidemia Mother   . Skin cancer Mother   . Dementia Mother   . Congestive Heart Failure Father   . Diabetes Father   . Heart disease Father        congestive heart failure  . Multiple sclerosis Sister   . Heart disease Sister   . Kidney Stones Sister   . Hypertension Daughter   . Anxiety disorder Daughter   . Diabetes Paternal Grandfather   . Tremor Sister   . Colon cancer Neg Hx   . Esophageal cancer Neg Hx   . Stomach cancer Neg Hx   . Rectal cancer Neg Hx   . Inflammatory bowel disease Neg Hx   . Liver disease Neg Hx   . Pancreatic cancer Neg Hx     Social History   Socioeconomic History  . Marital status: Married    Spouse name: Denise Benton   . Number of children: 2  . Years of education: 4 years college  . Highest education level: Bachelor's degree (e.g., BA, AB, BS)  Occupational History  . Occupation: Retired RN/NP  . Occupation: Works part time at [employer]  . Smoking status: Former Smoker    Packs/day: 0.50    Years: 1.00    Pack years: 0.50    Types: Cigarettes    Quit date: 01/13/1969    Years since quitting: 50.8  . Smokeless tobacco: Never Used  Vaping Use  .  Vaping Use: Never used  Substance and Sexual Activity  . Alcohol use: No  . Drug use: No  . Sexual activity: Not Currently  Other Topics Concern  . Not on file  Social History Narrative  . Not on file   Social Determinants of Health   Financial Resource Strain:   . Difficulty of Paying Living Expenses: Not on file  Food Insecurity:   . Worried About Programme researcher, broadcasting/film/video in the Last Year: Not on file  . Ran Out of Food in the Last Year: Not on file  Transportation Needs:   . Lack of Transportation (Medical): Not on file  . Lack of Transportation (Non-Medical): Not on file  Physical Activity:   . Days of Exercise per Week: Not on file  . Minutes of Exercise per Session: Not on file  Stress:   . Feeling of Stress : Not on file  Social Connections:   . Frequency of  Communication with Friends and Family: Not on file  . Frequency of Social Gatherings with Friends and Family: Not on file  . Attends Religious Services: Not on file  . Active Member of Clubs or Organizations: Not on file  . Attends Banker Meetings: Not on file  . Marital Status: Not on file  Intimate Partner Violence:   . Fear of Current or Ex-Partner: Not on file  . Emotionally Abused: Not on file  . Physically Abused: Not on file  . Sexually Abused: Not on file    Outpatient Medications Prior to Visit  Medication Sig Dispense Refill  . acetaminophen (TYLENOL) 325 MG tablet Take 325 mg by mouth as needed.    . Alum & Mag Hydroxide-Simeth (MYLANTA PO) Take by mouth as needed.     Marland Kitchen aspirin EC 81 MG tablet Take 81 mg by mouth once a week.    . B Complex Vitamins (VITAMIN-B COMPLEX PO) Take 1 tablet by mouth daily.    . Biotin 47096 MCG TABS Take by mouth daily.    . Calcium Carbonate (CALTRATE 600 PO) Take 1 tablet by mouth. Bi weekly    . Cholecalciferol (VITAMIN D) 50 MCG (2000 UT) CAPS Take by mouth daily.     . Docosahexaenoic Acid (DHA PO) Take 1 tablet by mouth once a week.     Marland Kitchen ELDERBERRY PO Take 1 Dose by mouth as needed.    Marland Kitchen esomeprazole (NEXIUM) 20 MG capsule Take 1 capsule (20 mg total) by mouth 2 (two) times daily before a meal. 60 capsule 3  . folic acid (FOLVITE) 800 MCG tablet Take 400 mcg by mouth once a week.     . levOCARNitine-E-Coenzyme Q10 (CO Q-10 PLUS L-CARNITINE PO) Take 1 tablet by mouth daily.    . Multiple Vitamin (MULTIVITAMIN ADULT PO) Take by mouth.    . Multiple Vitamins-Minerals (EMERGEN-C IMMUNE PO) Take 1 tablet by mouth as needed.    . Omega-3 Fatty Acids (EPA PO) Take 1 tablet by mouth 3 (three) times a week.     . phenazopyridine (PYRIDIUM) 200 MG tablet Take 1 tablet (200 mg total) by mouth 3 (three) times daily as needed for pain. Do not use for more than 2 days. 10 tablet 0  . sucralfate (CARAFATE) 1 g tablet Take 1 tablet (1 g  total) by mouth 2 (two) times daily. 30 tablet 1  . Tiotropium Bromide Monohydrate (SPIRIVA RESPIMAT) 1.25 MCG/ACT AERS Inhale 2 puffs into the lungs daily. 1 each 2  . albuterol (VENTOLIN  HFA) 108 (90 Base) MCG/ACT inhaler Inhale into the lungs.     No facility-administered medications prior to visit.    Allergies  Allergen Reactions  . Ivp Dye [Iodinated Diagnostic Agents] Other (See Comments)    Respiratory distress  . Codeine Nausea Only  . Niaspan [Niacin Er] Rash and Other (See Comments)    Nightmares    ROS Review of Systems  Skin: Negative for color change and rash.  All other systems reviewed and are negative.     Objective:    Physical Exam Vitals reviewed.  Constitutional:      Appearance: Normal appearance. She is normal weight.  HENT:     Head: Normocephalic.     Right Ear: External ear normal. There is no impacted cerumen.     Left Ear: External ear normal. There is no impacted cerumen.     Nose: Nose normal.     Mouth/Throat:     Mouth: Mucous membranes are moist.     Pharynx: Oropharynx is clear.  Eyes:     Conjunctiva/sclera: Conjunctivae normal.  Cardiovascular:     Rate and Rhythm: Normal rate and regular rhythm.     Pulses: Normal pulses.     Heart sounds: Normal heart sounds.  Pulmonary:     Effort: Pulmonary effort is normal.     Breath sounds: Normal breath sounds.  Abdominal:     General: Bowel sounds are normal.  Musculoskeletal:        General: Normal range of motion.     Cervical back: Normal range of motion.  Skin:    General: Skin is warm.  Neurological:     Mental Status: She is alert and oriented to person, place, and time.  Psychiatric:        Mood and Affect: Mood normal.        Behavior: Behavior normal.     BP 112/69   Pulse 63   Temp (!) 97.1 F (36.2 C) (Temporal)   Ht 5\' 1"  (1.549 m)   Wt 116 lb 9.6 oz (52.9 kg)   SpO2 98%   BMI 22.03 kg/m  Wt Readings from Last 3 Encounters:  11/08/19 116 lb 9.6 oz (52.9 kg)   10/14/19 119 lb 12.8 oz (54.3 kg)  05/17/19 114 lb (51.7 kg)     Health Maintenance Due  Topic Date Due  . MAMMOGRAM  12/31/2013    There are no preventive care reminders to display for this patient.  Lab Results  Component Value Date   TSH 4.170 05/02/2019   Lab Results  Component Value Date   WBC 6.6 03/22/2019   HGB 13.1 03/22/2019   HCT 39.0 03/22/2019   MCV 92.9 03/22/2019   PLT 292.0 03/22/2019   Lab Results  Component Value Date   NA 143 03/22/2019   K 4.1 03/22/2019   CO2 31 03/22/2019   GLUCOSE 82 03/22/2019   BUN 21 03/22/2019   CREATININE 0.82 03/22/2019   BILITOT 0.7 03/22/2019   ALKPHOS 83 03/22/2019   AST 22 03/22/2019   ALT 19 03/22/2019   PROT 6.8 03/22/2019   ALBUMIN 4.1 03/22/2019   CALCIUM 9.3 03/22/2019   GFR 69.19 03/22/2019     Assessment & Plan:   Problem List Items Addressed This Visit      Digestive   GERD (gastroesophageal reflux disease)    GERD well managed on current medication no changes necessary to medication dose.  Continue maintain healthy diet and exercise regimen as tolerated.  Provided education with printed handouts given. Follow-up with unresolved or worsening symptoms.        Other   Annual physical exam - Primary    Patient is a 69 year old female who presents to clinic for annual physical exam.  Completed head to toe assessment.  Patient has no new concerns.  Provided education on preventative care and health maintenance.  Printed handouts given. Follow-up in 1 year. Labs completed today CMP, lipid panel.-Lab results pending CBC was completed 6/21.      Relevant Orders   Comprehensive metabolic panel   Lipid Panel   Need for immunization against influenza   Relevant Orders   Flu Vaccine QUAD High Dose(Fluad) (Completed)      No orders of the defined types were placed in this encounter.   Follow-up: Return in about 1 year (around 11/07/2020).    Daryll Drownnyeje M Mieko Kneebone, NP

## 2019-11-08 NOTE — Patient Instructions (Signed)
Follow up in 1 year  Health Maintenance After Age 69 After age 7, you are at a higher risk for certain long-term diseases and infections as well as injuries from falls. Falls are a major cause of broken bones and head injuries in people who are older than age 57. Getting regular preventive care can help to keep you healthy and well. Preventive care includes getting regular testing and making lifestyle changes as recommended by your health care provider. Talk with your health care provider about:  Which screenings and tests you should have. A screening is a test that checks for a disease when you have no symptoms.  A diet and exercise plan that is right for you. What should I know about screenings and tests to prevent falls? Screening and testing are the best ways to find a health problem early. Early diagnosis and treatment give you the best chance of managing medical conditions that are common after age 36. Certain conditions and lifestyle choices may make you more likely to have a fall. Your health care provider may recommend:  Regular vision checks. Poor vision and conditions such as cataracts can make you more likely to have a fall. If you wear glasses, make sure to get your prescription updated if your vision changes.  Medicine review. Work with your health care provider to regularly review all of the medicines you are taking, including over-the-counter medicines. Ask your health care provider about any side effects that may make you more likely to have a fall. Tell your health care provider if any medicines that you take make you feel dizzy or sleepy.  Osteoporosis screening. Osteoporosis is a condition that causes the bones to get weaker. This can make the bones weak and cause them to break more easily.  Blood pressure screening. Blood pressure changes and medicines to control blood pressure can make you feel dizzy.  Strength and balance checks. Your health care provider may recommend  certain tests to check your strength and balance while standing, walking, or changing positions.  Foot health exam. Foot pain and numbness, as well as not wearing proper footwear, can make you more likely to have a fall.  Depression screening. You may be more likely to have a fall if you have a fear of falling, feel emotionally low, or feel unable to do activities that you used to do.  Alcohol use screening. Using too much alcohol can affect your balance and may make you more likely to have a fall. What actions can I take to lower my risk of falls? General instructions  Talk with your health care provider about your risks for falling. Tell your health care provider if: ? You fall. Be sure to tell your health care provider about all falls, even ones that seem minor. ? You feel dizzy, sleepy, or off-balance.  Take over-the-counter and prescription medicines only as told by your health care provider. These include any supplements.  Eat a healthy diet and maintain a healthy weight. A healthy diet includes low-fat dairy products, low-fat (lean) meats, and fiber from whole grains, beans, and lots of fruits and vegetables. Home safety  Remove any tripping hazards, such as rugs, cords, and clutter.  Install safety equipment such as grab bars in bathrooms and safety rails on stairs.  Keep rooms and walkways well-lit. Activity   Follow a regular exercise program to stay fit. This will help you maintain your balance. Ask your health care provider what types of exercise are appropriate for you.  If you need a cane or walker, use it as recommended by your health care provider.  Wear supportive shoes that have nonskid soles. Lifestyle  Do not drink alcohol if your health care provider tells you not to drink.  If you drink alcohol, limit how much you have: ? 0-1 drink a day for women. ? 0-2 drinks a day for men.  Be aware of how much alcohol is in your drink. In the [place] one drink equals  one typical bottle of beer (12 oz), one-half glass of wine (5 oz), or one shot of hard liquor (1 oz).  Do not use any products that contain nicotine or tobacco, such as cigarettes and e-cigarettes. If you need help quitting, ask your health care provider. Summary  Having a healthy lifestyle and getting preventive care can help to protect your health and wellness after age 55.  Screening and testing are the best way to find a health problem early and help you avoid having a fall. Early diagnosis and treatment give you the best chance for managing medical conditions that are more common for people who are older than age 65.  Falls are a major cause of broken bones and head injuries in people who are older than age 34. Take precautions to prevent a fall at home.  Work with your health care provider to learn what changes you can make to improve your health and wellness and to prevent falls. This information is not intended to replace advice given to you by your health care provider. Make sure you discuss any questions you have with your health care provider. Document Revised: 04/22/2018 Document Reviewed: 11/12/2016 Elsevier Patient Education  2020 [employer].

## 2019-11-09 ENCOUNTER — Other Ambulatory Visit: Payer: Medicare Other

## 2019-11-09 DIAGNOSIS — Z0000 Encounter for general adult medical examination without abnormal findings: Secondary | ICD-10-CM

## 2019-11-09 LAB — LIPID PANEL

## 2019-11-10 LAB — COMPREHENSIVE METABOLIC PANEL
ALT: 17 IU/L (ref 0–32)
AST: 25 IU/L (ref 0–40)
Albumin/Globulin Ratio: 2 (ref 1.2–2.2)
Albumin: 4.5 g/dL (ref 3.8–4.8)
Alkaline Phosphatase: 89 IU/L (ref 44–121)
BUN/Creatinine Ratio: 20 (ref 12–28)
BUN: 19 mg/dL (ref 8–27)
Bilirubin Total: 0.8 mg/dL (ref 0.0–1.2)
CO2: 28 mmol/L (ref 20–29)
Calcium: 9.7 mg/dL (ref 8.7–10.3)
Chloride: 99 mmol/L (ref 96–106)
Creatinine, Ser: 0.95 mg/dL (ref 0.57–1.00)
GFR calc Af Amer: 71 mL/min/{1.73_m2} (ref >59)
GFR calc non Af Amer: 61 mL/min/{1.73_m2} (ref >59)
Globulin, Total: 2.3 g/dL (ref 1.5–4.5)
Glucose: 73 mg/dL (ref 65–99)
Potassium: 4.6 mmol/L (ref 3.5–5.2)
Sodium: 141 mmol/L (ref 134–144)
Total Protein: 6.8 g/dL (ref 6.0–8.5)

## 2019-11-10 LAB — LIPID PANEL
Chol/HDL Ratio: 3 ratio (ref 0.0–4.4)
Cholesterol, Total: 237 mg/dL — ABNORMAL HIGH (ref 100–199)
HDL: 79 mg/dL (ref >39)
LDL Chol Calc (NIH): 141 mg/dL — ABNORMAL HIGH (ref 0–99)
Triglycerides: 98 mg/dL (ref 0–149)
VLDL Cholesterol Cal: 17 mg/dL (ref 5–40)

## 2019-11-11 NOTE — Addendum Note (Signed)
Addended by: Daryll Drown on: 11/11/2019 09:46 AM   Modules accepted: Level of Service

## 2019-11-14 ENCOUNTER — Ambulatory Visit (HOSPITAL_COMMUNITY): Payer: Medicare Other

## 2019-11-28 ENCOUNTER — Other Ambulatory Visit: Payer: Self-pay

## 2019-11-28 ENCOUNTER — Ambulatory Visit (HOSPITAL_COMMUNITY)
Admission: RE | Admit: 2019-11-28 | Discharge: 2019-11-28 | Disposition: A | Discharge status: Home / Self Care | Payer: Medicare Other | Source: Ambulatory Visit | Attending: Family Medicine | Admitting: Family Medicine

## 2019-11-28 DIAGNOSIS — Z1231 Encounter for screening mammogram for malignant neoplasm of breast: Secondary | ICD-10-CM | POA: Insufficient documentation

## 2019-12-01 ENCOUNTER — Ambulatory Visit (INDEPENDENT_AMBULATORY_CARE_PROVIDER_SITE_OTHER): Payer: Medicare Other | Admitting: Nurse Practitioner

## 2019-12-01 ENCOUNTER — Telehealth: Payer: Self-pay | Admitting: Family Medicine

## 2019-12-01 DIAGNOSIS — J45909 Unspecified asthma, uncomplicated: Secondary | ICD-10-CM | POA: Diagnosis not present

## 2019-12-01 NOTE — Telephone Encounter (Signed)
Yes I did already. Thanks

## 2019-12-01 NOTE — Telephone Encounter (Signed)
Did you call patient?

## 2019-12-01 NOTE — Assessment & Plan Note (Signed)
Asthma not well controlled, on current regimen, patient is reporting shortness of breath, cough, congestion.  Patient is needing new PFT, last completed October 2020. Provided education to patient Completed referral to pulmonology.

## 2019-12-01 NOTE — Progress Notes (Signed)
   Virtual Visit via telephone Note Due to COVID-19 pandemic this visit was conducted virtually. This visit type was conducted due to national recommendations for restrictions regarding the COVID-19 Pandemic (e.g. social distancing, sheltering in place) in an effort to limit this patient's exposure and mitigate transmission in our community. All issues noted in this document were discussed and addressed.  A physical exam was not performed with this format.  I connected with Denise Benton on 12/01/19 at 09:55 am  by telephone and verified that I am speaking with the correct person using two identifiers. Denise Benton is currently located at home and spouse is currently with patient during visit. The provider, Daryll Drown, NP is located in their office at time of visit.  I discussed the limitations, risks, security and privacy concerns of performing an evaluation and management service by telephone and the availability of in person appointments. I also discussed with the patient that there may be a patient responsible charge related to this service. The patient expressed understanding and agreed to proceed.   History and Present Illness:  HPI Patient is seen via televisit for worsening asthma, current regimen is albuterol, and Spiriva.  Patient is reporting shortness of breath, cough, congestion.  Patient denies fever, nausea and vomiting.  Patient is needing new PFT, last completed October 2020.      Review of Systems  Constitutional: Negative for chills, fever and malaise/fatigue.  Respiratory: Positive for cough and shortness of breath.   Cardiovascular: Negative for chest pain.  Skin: Negative for rash.  Psychiatric/Behavioral: The patient is not nervous/anxious.   All other systems reviewed and are negative.    Observations/Objective:  Tele visit Assessment and Plan:  Asthma Asthma not well controlled, on current regimen, patient is reporting shortness of breath, cough,  congestion.  Patient is needing new PFT, last completed October 2020. Provided education to patient Completed referral to pulmonology.   Follow Up Instructions:  Follow up with worsening or unresolved symptoms    I discussed the assessment and treatment plan with the patient. The patient was provided an opportunity to ask questions and all were answered. The patient agreed with the plan and demonstrated an understanding of the instructions.   The patient was advised to call back or seek an in-person evaluation if the symptoms worsen or if the condition fails to improve as anticipated.  The above assessment and management plan was discussed with the patient. The patient verbalized understanding of and has agreed to the management plan. Patient is aware to call the clinic if symptoms persist or worsen. Patient is aware when to return to the clinic for a follow-up visit. Patient educated on when it is appropriate to go to the emergency department.   Time call ended:  10:10 am  I provided 10  minutes of non-face-to-face time during this encounter.    Daryll Drown, NP

## 2020-01-31 ENCOUNTER — Institutional Professional Consult (permissible substitution): Payer: Medicare Other | Admitting: Pulmonary Disease

## 2020-02-07 ENCOUNTER — Other Ambulatory Visit: Payer: Self-pay

## 2020-02-07 ENCOUNTER — Other Ambulatory Visit: Payer: Medicare Other

## 2020-02-07 DIAGNOSIS — Z20822 Contact with and (suspected) exposure to covid-19: Secondary | ICD-10-CM

## 2020-02-08 LAB — SARS-COV-2, NAA 2 DAY TAT

## 2020-02-08 LAB — NOVEL CORONAVIRUS, NAA: SARS-CoV-2, NAA: NOT DETECTED

## 2020-03-06 ENCOUNTER — Encounter: Payer: Self-pay | Admitting: Internal Medicine

## 2020-03-06 ENCOUNTER — Ambulatory Visit (INDEPENDENT_AMBULATORY_CARE_PROVIDER_SITE_OTHER): Payer: Medicare Other | Admitting: Internal Medicine

## 2020-03-06 ENCOUNTER — Other Ambulatory Visit: Payer: Self-pay

## 2020-03-06 VITALS — BP 116/78 | HR 65 | Temp 97.1°F | Ht 61.0 in | Wt 121.0 lb

## 2020-03-06 DIAGNOSIS — K219 Gastro-esophageal reflux disease without esophagitis: Secondary | ICD-10-CM

## 2020-03-06 DIAGNOSIS — J4530 Mild persistent asthma, uncomplicated: Secondary | ICD-10-CM

## 2020-03-06 DIAGNOSIS — J45909 Unspecified asthma, uncomplicated: Secondary | ICD-10-CM | POA: Diagnosis not present

## 2020-03-06 NOTE — Patient Instructions (Addendum)
We need a copy of your PFTs from [facility].   Try albuterol 15 min before an activity that you know would make you short of breath and see if it makes any difference and if makes none then don't take it after activity unless you can't catch your breath.      Work on inhaler technique:  relax and gently blow all the way out then take a nice smooth deep breath back in, triggering the inhaler (with a slight delay and  at same time you start breathing in.  Hold for up to 5 seconds if you can. Blow out thru nose. Rinse and gargle with water when done  GERD (REFLUX)  is an extremely common cause of respiratory symptoms just like yours , many times with no obvious heartburn at all.    It can be treated with medication, but also with lifestyle changes including elevation of the head of your bed (ideally with 6 -8inch blocks under the headboard of your bed),  Smoking cessation, avoidance of late meals, excessive alcohol, and avoid fatty foods, chocolate, peppermint, colas, red wine, and acidic juices such as orange juice.  NO MINT OR MENTHOL PRODUCTS SO NO COUGH DROPS  USE SUGARLESS CANDY INSTEAD (Jolley ranchers or Stover's or Life Savers) or even ice chips will also do - the key is to swallow to prevent all throat clearing. NO OIL BASED VITAMINS - use powdered substitutes.  Avoid fish oil when coughing.  Pulmonary follow up is as needed

## 2020-03-06 NOTE — Progress Notes (Signed)
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 recurrent   bad cough/chest tightness esp in winter since 1998 Abx/pred knocks it out  with onset of doe around 2018 with heavy activity lifting 50 lb   paced herself and worse in 2019 assoc with bad cough > no better with zpak/pred by [place]  > saba inhaler helped short term >  [place] dx LPR +/- asthma eval done virtually due to covid so this was spring of 2020 > rx advair and doing better since but advised by [facility] pulmonologist she needed f/u in [place] so referred to [facility] pulmonary office 03/06/2020      History of Present Illness  03/06/2020  Pulmonary/ 1st office eval/ [facility] Office  Chief Complaint  Patient presents with   Pulmonary Consult    Referred by Lynnell Chad, NP.  Pt states that she was dx with Asthma in 2020. She states that she started having SOB summer of 2019. She states she has hx of chronic bronchitis.   Dyspnea: ex class twice a week no problem Cough: none  Sleep: bed is flat/ 2 pillow occ reflux - says  can't do bed blocks  SABA use: none   covid status:  Never vaccinated but says had covid jan 2022   No obvious day to day or daytime variability or assoc excess/ purulent sputum or mucus plugs or hemoptysis or cp or chest tightness, subjective wheeze or overt sinus symptoms.   Sleeping as above without nocturnal  or early am exacerbation  of respiratory  c/o's or need for noct saba. Also denies any obvious fluctuation of symptoms with weather or environmental changes or other aggravating or alleviating factors except as outlined above   No unusual exposure hx or h/o childhood pna/ asthma or knowledge of premature birth.  Current Allergies, Complete Past Medical History, Past Surgical History, Family History, and Social History were reviewed in [employer] record.  ROS   The following are not active complaints unless bolded Hoarseness, sore throat, dysphagia, dental problems, itching, sneezing,  nasal congestion or discharge of excess mucus or purulent secretions, ear ache,   fever, chills, sweats, unintended wt loss or wt gain, classically pleuritic or exertional cp,  orthopnea pnd or arm/hand swelling  or leg swelling, presyncope, palpitations, abdominal pain, anorexia, nausea, vomiting, diarrhea  or change in bowel habits or change in bladder habits, change in stools or change in urine, dysuria, hematuria,  rash, arthralgias, visual complaints, headache, numbness, weakness or ataxia or problems with walking or coordination,  change in mood or  memory.           Past Medical History:  Diagnosis Date   Allergy    seasonal   Arthritis    Asthma    Duodenal ulcer    Gallstones    GERD (gastroesophageal reflux disease)    HLD (hyperlipidemia)    IBS (irritable bowel syndrome)    Kidney stones    Lumbosacral disc disease    L4-5, L5-S1   Osteopenia     Outpatient Medications Prior to Visit  Medication Sig Dispense Refill   acetaminophen (TYLENOL) 325 MG tablet Take 325 mg by mouth as needed.     albuterol (VENTOLIN HFA) 108 (90 Base) MCG/ACT inhaler Inhale into the lungs.     Alum & Mag Hydroxide-Simeth (MYLANTA PO) Take by  mouth as needed.      aspirin EC 81 MG tablet Take 81 mg by mouth once a week.     B Complex Vitamins (VITAMIN-B COMPLEX PO) Take 1 tablet by mouth daily.     Biotin 60737 MCG TABS Take by mouth daily.     Calcium Carbonate (CALTRATE 600 PO) Take 1 tablet by mouth. Bi weekly     Cholecalciferol (VITAMIN D) 50 MCG (2000 UT) CAPS Take by mouth daily.      Docosahexaenoic Acid (DHA PO) Take 1 tablet by mouth once a week.      ELDERBERRY PO Take 1 Dose by mouth as needed.     fluticasone-salmeterol (ADVAIR HFA) 115-21 MCG/ACT inhaler Inhale 2 puffs into the lungs 2 (two) times daily. As needed     folic acid  (FOLVITE) 800 MCG tablet Take 400 mcg by mouth once a week.      levOCARNitine-E-Coenzyme Q10 (CO Q-10 PLUS L-CARNITINE PO) Take 1 tablet by mouth daily.     Multiple Vitamin (MULTIVITAMIN ADULT PO) Take by mouth.     Multiple Vitamins-Minerals (EMERGEN-C IMMUNE PO) Take 1 tablet by mouth as needed.     Omega-3 Fatty Acids (EPA PO) Take 1 tablet by mouth 3 (three) times a week.      esomeprazole (NEXIUM) 20 MG capsule Take 1 capsule (20 mg total) by mouth 2 (two) times daily before a meal. 60 capsule 3   phenazopyridine (PYRIDIUM) 200 MG tablet Take 1 tablet (200 mg total) by mouth 3 (three) times daily as needed for pain. Do not use for more than 2 days. 10 tablet 0   sucralfate (CARAFATE) 1 g tablet Take 1 tablet (1 g total) by mouth 2 (two) times daily. 30 tablet 1   Tiotropium Bromide Monohydrate (SPIRIVA RESPIMAT) 1.25 MCG/ACT AERS Inhale 2 puffs into the lungs daily. 1 each 2   No facility-administered medications prior to visit.     Objective:     BP 116/78 (BP Location: Left Arm, Cuff Size: Normal)    Pulse 65    Temp (!) 97.1 F (36.2 C) (Temporal)    Ht 5\' 1"  (1.549 m)    Wt 121 lb (54.9 kg)    SpO2 99% Comment: on RA   BMI 22.86 kg/m   SpO2: 99 % (on RA)   amb wf nad   HEENT : pt wearing mask not removed for exam due to covid -19 concerns.    NECK :  without JVD/Nodes/TM/ nl carotid upstrokes bilaterally   LUNGS: no acc muscle use,  Nl contour chest which is clear to A and P bilaterally without cough on insp or exp maneuvers   CV:  RRR  no s3 or murmur or increase in P2, and no edema   ABD:  soft and nontender with nl inspiratory excursion in the supine position. No bruits or organomegaly appreciated, bowel sounds nl  MS:  Nl gait/ ext warm without deformities, calf tenderness, cyanosis or clubbing No obvious joint restrictions   SKIN: warm and dry without lesions    NEURO:  alert, approp, nl sensorium with  no motor or cerebellar deficits apparent.        Assessment   No problem-specific Assessment & Plan notes found for this encounter.     , MD 03/06/2020

## 2020-03-07 ENCOUNTER — Encounter: Payer: Self-pay | Admitting: Internal Medicine

## 2020-03-07 NOTE — Assessment & Plan Note (Signed)
Onset of recurrent bronchitis in 1998 req short term pred/abx - started on med dose advair by [place] spring 2020  - 03/06/2020  After extensive coaching inhaler device,  effectiveness =    80% > continue Advair 115 2bid    All goals of chronic asthma control met including optimal function and elimination of symptoms with minimal need for rescue therapy.  Contingencies discussed in full including contacting this office immediately if not controlling the symptoms using the rule of two's.     F/u can be prn refills or they can be done by PCP

## 2020-03-07 NOTE — Assessment & Plan Note (Signed)
Dx at [facility] med center around 2020   Reviewed diet/ timing for ppi's and f/u per PCP/ GI prn          Each maintenance medication was reviewed in detail including emphasizing most importantly the difference between maintenance and prns and under what circumstances the prns are to be triggered using an action plan format where appropriate.  Total time for H and P, chart review, counseling, reviewing hfa device(s) and generating customized AVS unique to this office visit / same day charting = 45 min

## 2020-05-11 ENCOUNTER — Encounter: Payer: Self-pay | Admitting: Family Medicine

## 2020-05-11 ENCOUNTER — Other Ambulatory Visit: Payer: Self-pay

## 2020-05-11 ENCOUNTER — Ambulatory Visit (INDEPENDENT_AMBULATORY_CARE_PROVIDER_SITE_OTHER): Payer: Medicare Other | Admitting: Family Medicine

## 2020-05-11 VITALS — BP 97/60 | HR 53 | Temp 97.1°F | Ht 61.0 in | Wt 119.2 lb

## 2020-05-11 DIAGNOSIS — L989 Disorder of the skin and subcutaneous tissue, unspecified: Secondary | ICD-10-CM

## 2020-05-11 NOTE — Progress Notes (Signed)
Assessment & Plan:  1. Changing skin lesion - Ambulatory referral to Dermatology   Follow up plan: No follow-ups on file.  [name], MSN, APRN, FNP-C [facility]  Subjective:   Patient ID: Denise Benton, female    DOB: [birth year], 70 y.o.   MRN: [id number]  HPI: Denise Benton is a 70 y.o. female presenting on 05/11/2020 for Nevus (Patient has a few moles she would like looked at.  States their has been some color change with the one near her breast. )  Patient reports a mole has changed under her left breast.    ROS: Negative unless specifically indicated above in HPI.   Relevant past medical history reviewed and updated as indicated.   Allergies and medications reviewed and updated.   Current Outpatient Medications:  .  acetaminophen (TYLENOL) 325 MG tablet, Take 325 mg by mouth as needed., Disp: , Rfl:  .  Alum & Mag Hydroxide-Simeth (MYLANTA PO), Take by mouth as needed. , Disp: , Rfl:  .  aspirin EC 81 MG tablet, Take 81 mg by mouth once a week., Disp: , Rfl:  .  B Complex Vitamins (VITAMIN-B COMPLEX PO), Take 1 tablet by mouth daily., Disp: , Rfl:  .  Biotin 96789 MCG TABS, Take by mouth daily., Disp: , Rfl:  .  Calcium Carbonate (CALTRATE 600 PO), Take 1 tablet by mouth. Bi weekly, Disp: , Rfl:  .  Cholecalciferol (VITAMIN D) 50 MCG (2000 UT) CAPS, Take by mouth daily. , Disp: , Rfl:  .  Docosahexaenoic Acid (DHA PO), Take 1 tablet by mouth once a week. , Disp: , Rfl:  .  ELDERBERRY PO, Take 1 Dose by mouth as needed., Disp: , Rfl:  .  fluticasone-salmeterol (ADVAIR HFA) 115-21 MCG/ACT inhaler, Inhale 2 puffs into the lungs 2 (two) times daily. As needed, Disp: , Rfl:  .  folic acid (FOLVITE) 800 MCG tablet, Take 400 mcg by mouth once a week. , Disp: , Rfl:  .  levOCARNitine-E-Coenzyme Q10 (CO Q-10 PLUS L-CARNITINE PO), Take 1 tablet by mouth daily., Disp: , Rfl:  .  Multiple Vitamin (MULTIVITAMIN ADULT PO), Take by mouth., Disp: , Rfl:   .  Multiple Vitamins-Minerals (EMERGEN-C IMMUNE PO), Take 1 tablet by mouth as needed., Disp: , Rfl:  .  Omega-3 Fatty Acids (EPA PO), Take 1 tablet by mouth 3 (three) times a week. , Disp: , Rfl:  .  albuterol (VENTOLIN HFA) 108 (90 Base) MCG/ACT inhaler, Inhale into the lungs., Disp: , Rfl:   Allergies  Allergen Reactions  . Ivp Dye [Iodinated Diagnostic Agents] Other (See Comments)    Respiratory distress  . Codeine Nausea Only  . Niaspan [Niacin Er] Rash and Other (See Comments)    Nightmares    Objective:   BP 97/60 Comment: at home  Pulse (!) 53   Temp (!) 97.1 F (36.2 C) (Temporal)   Ht 5\' 1"  (1.549 m)   Wt 119 lb 3.2 oz (54.1 kg)   SpO2 100%   BMI 22.52 kg/m    Physical Exam Vitals reviewed.  Constitutional:      General: She is not in acute distress.    Appearance: Normal appearance. She is not ill-appearing, toxic-appearing or diaphoretic.  HENT:     Head: Normocephalic and atraumatic.  Eyes:     General: No scleral icterus.       Right eye: No discharge.        Left eye: No discharge.  Conjunctiva/sclera: Conjunctivae normal.  Cardiovascular:     Rate and Rhythm: Normal rate.  Pulmonary:     Effort: Pulmonary effort is normal. No respiratory distress.  Musculoskeletal:        General: Normal range of motion.     Cervical back: Normal range of motion.  Skin:    General: Skin is warm and dry.     Capillary Refill: Capillary refill takes less than 2 seconds.     Comments: Mole of concern under left breast looks like two lesions that touch - a seborrheic keratosis and a cherry angioma.  Neurological:     General: No focal deficit present.     Mental Status: She is alert and oriented to person, place, and time. Mental status is at baseline.  Psychiatric:        Mood and Affect: Mood normal.        Behavior: Behavior normal.        Thought Content: Thought content normal.        Judgment: Judgment normal.

## 2020-06-08 ENCOUNTER — Ambulatory Visit (INDEPENDENT_AMBULATORY_CARE_PROVIDER_SITE_OTHER): Payer: Medicare Other

## 2020-06-08 VITALS — Ht 61.0 in | Wt 118.0 lb

## 2020-06-08 DIAGNOSIS — Z0000 Encounter for general adult medical examination without abnormal findings: Secondary | ICD-10-CM | POA: Diagnosis not present

## 2020-06-08 NOTE — Progress Notes (Signed)
Subjective:   Denise HensenVirginia L Benton is a 70 y.o. female who presents for Medicare Annual (Subsequent) preventive examination.  Review of Systems     Cardiac Risk Factors include: advanced age (>[id number], 57>65 women);dyslipidemia;Other (see comment), Risk factor comments: asthma/breathing trouble     Objective:    Today's Vitals   06/08/20 0848  Weight: 118 lb (53.5 kg)  Height: 5\' 1"  (1.549 m)   Body mass index is 22.3 kg/m.  Advanced Directives 06/08/2020 04/05/2019  Does Patient Have a Medical Advance Directive? Yes Yes  Type of Estate agentAdvance Directive Healthcare Power of [place]Attorney;Living will Healthcare Power of PortiaAttorney;Living will  Does patient want to make changes to medical advance directive? - No - Patient declined  Copy of Healthcare Power of Attorney in Chart? No - copy requested No - copy requested    Current Medications (verified) Outpatient Encounter Medications as of 06/08/2020  Medication Sig  . acetaminophen (TYLENOL) 325 MG tablet Take 325 mg by mouth as needed.  Marland Kitchen. albuterol (VENTOLIN HFA) 108 (90 Base) MCG/ACT inhaler Inhale into the lungs.  . Alum & Mag Hydroxide-Simeth (MYLANTA PO) Take by mouth as needed.   Marland Kitchen. aspirin EC 81 MG tablet Take 81 mg by mouth once a week.  . B Complex Vitamins (VITAMIN-B COMPLEX PO) Take 1 tablet by mouth daily.  . Biotin 7253610000 MCG TABS Take by mouth daily.  . Calcium Carbonate (CALTRATE 600 PO) Take 1 tablet by mouth. Bi weekly  . Cholecalciferol (VITAMIN D) 50 MCG (2000 UT) CAPS Take by mouth daily.   . Docosahexaenoic Acid (DHA PO) Take 1 tablet by mouth once a week.   Marland Kitchen. ELDERBERRY PO Take 1 Dose by mouth as needed.  . fluticasone-salmeterol (ADVAIR HFA) 115-21 MCG/ACT inhaler Inhale 2 puffs into the lungs 2 (two) times daily. As needed  . folic acid (FOLVITE) 800 MCG tablet Take 400 mcg by mouth once a week.   . levOCARNitine-E-Coenzyme Q10 (CO Q-10 PLUS L-CARNITINE PO) Take 1 tablet by mouth daily.  . Multiple Vitamin (MULTIVITAMIN ADULT  PO) Take by mouth.  . Multiple Vitamins-Minerals (EMERGEN-C IMMUNE PO) Take 1 tablet by mouth as needed.  . Omega-3 Fatty Acids (EPA PO) Take 1 tablet by mouth 3 (three) times a week.    No facility-administered encounter medications on file as of 06/08/2020.    Allergies (verified) Ivp dye [iodinated diagnostic agents], Codeine, and Niaspan [niacin er]   History: Past Medical History:  Diagnosis Date  . Allergy    seasonal  . Arthritis   . Asthma   . Duodenal ulcer   . Gallstones   . GERD (gastroesophageal reflux disease)   . HLD (hyperlipidemia)   . IBS (irritable bowel syndrome)   . Kidney stones   . Lumbosacral disc disease    L4-5, L5-S1  . Osteopenia    Past Surgical History:  Procedure Laterality Date  . BREAST CYST ASPIRATION Right    pt has cysts aspiration done for lumps  . CESAREAN SECTION  1980   x2; again in 1984  . LAPAROSCOPIC CHOLECYSTECTOMY  2000  . SKIN LESION EXCISION  07/2011   from scalp - keratosis   Family History  Problem Relation Age of Onset  . Tremor Mother   . Hyperlipidemia Mother   . Skin cancer Mother   . Dementia Mother   . Congestive Heart Failure Father   . Diabetes Father   . Heart disease Father        congestive heart failure  .  Multiple sclerosis Sister   . Heart disease Sister   . Kidney Stones Sister   . Hypertension Daughter   . Anxiety disorder Daughter   . Diabetes Paternal Grandfather   . Tremor Sister   . Colon cancer Neg Hx   . Esophageal cancer Neg Hx   . Stomach cancer Neg Hx   . Rectal cancer Neg Hx   . Inflammatory bowel disease Neg Hx   . Liver disease Neg Hx   . Pancreatic cancer Neg Hx    Social History   Socioeconomic History  . Marital status: Married    Spouse name: Fayrene Fearing   . Number of children: 2  . Years of education: 4 years college  . Highest education level: Bachelor's degree (e.g., BA, AB, BS)  Occupational History  . Occupation: Retired RN/NP  . Occupation: Works part time at [employer]  . Smoking status: Former Smoker    Packs/day: 0.50    Years: 1.00    Pack years: 0.50    Types: Cigarettes    Quit date: 01/13/1969    Years since quitting: 51.4  . Smokeless tobacco: Never Used  Vaping Use  . Vaping Use: Never used  Substance and Sexual Activity  . Alcohol use: No  . Drug use: No  . Sexual activity: Not Currently  Other Topics Concern  . Not on file  Social History Narrative   Lives home with husband - daughter in [place] here from [place] in 2020   Social Determinants of Health   Financial Resource Strain: Low Risk   . Difficulty of Paying Living Expenses: Not hard at all  Food Insecurity: No Food Insecurity  . Worried About Programme researcher, broadcasting/film/video in the Last Year: Never true  . Ran Out of Food in the Last Year: Never true  Transportation Needs: No Transportation Needs  . Lack of Transportation (Medical): No  . Lack of Transportation (Non-Medical): No  Physical Activity: Sufficiently Active  . Days of Exercise per Week: 7 days  . Minutes of Exercise per Session: 30 min  Stress: No Stress Concern Present  . Feeling of Stress : Not at all  Social Connections: Moderately Integrated  . Frequency of Communication with Friends and Family: More than three times a week  . Frequency of Social Gatherings with Friends and Family: More than three times a week  . Attends Religious Services: 1 to 4 times per year  . Active Member of Clubs or Organizations: No  . Attends Banker Meetings: Never  . Marital Status: Married    Tobacco Counseling Counseling given: Not Answered   Clinical Intake:  Pre-visit preparation completed: Yes  Pain : No/denies pain     BMI - recorded: 22.3 Nutritional Status: BMI of 19-24  Normal Nutritional Risks: None Diabetes: No  How often do you need to have someone help you when you read instructions, pamphlets, or other written materials from your doctor or pharmacy?: 1 -  Never  Diabetic? No  Interpreter Needed?: No  Information entered by :: Cydne Grahn, LPN   Activities of Daily Living In your present state of health, do you have any difficulty performing the following activities: 06/08/2020  Hearing? N  Vision? N  Difficulty concentrating or making decisions? N  Walking or climbing stairs? N  Dressing or bathing? N  Doing errands, shopping? N  Preparing Food and eating ? N  Using the Toilet? N  In the past  six months, have you accidently leaked urine? N  Do you have problems with loss of bowel control? N  Managing your Medications? N  Managing your Finances? N  Housekeeping or managing your Housekeeping? N  Some recent data might be hidden    Patient Care Team: Gwenlyn Fudge, FNP as PCP - General (Family Medicine) Arnaldo Natal, NP as Nurse Practitioner (Gastroenterology) Donnetta Hail, MD (Chiropractic Medicine) Nyoka Cowden, MD as Consulting Physician (Pulmonary Disease) Drema Halon, MD as Consulting Physician (Otolaryngology)  Indicate any recent Medical Services you may have received from other than [facility] providers in the past year (date may be approximate).     Assessment:   This is a routine wellness examination for [place].  Hearing/Vision screen  Hearing Screening             Right ear:           Left ear:           Comments: Denies hearing difficulties  Vision Screening Comments: Wears eyeglasses - Needs new eye doctor. Last seen in [place]  Dietary issues and exercise activities discussed: Current Exercise Habits: Home exercise routine, Type of exercise: strength training/weights;walking, Time (Minutes): 45, Frequency (Times/Week): 4, Weekly Exercise (Minutes/Week): 180, Intensity: Moderate, Exercise limited by: None identified  Goals Addressed            This Visit's Progress   . Patient Stated       Take her boards for  [employer]; stay active and healthy Get asthma under control      Depression Screen PHQ 2/9 Scores 06/08/2020 11/08/2019 04/21/2019 04/05/2019 03/15/2019 01/28/2019  PHQ - 2 Score 0 0 0 0 0 0    Fall Risk Fall Risk  06/08/2020 11/08/2019 04/21/2019 04/05/2019 03/15/2019  Falls in the past year? 0 0 0 0 0  Number falls in past yr: 0 - - - -  Injury with Fall? 0 - - - -  Risk for fall due to : No Fall Risks - - - -  Follow up Falls prevention discussed - - - -    FALL RISK PREVENTION PERTAINING TO THE HOME:  Any stairs in or around the home? Yes  If so, are there any without handrails? No  Home free of loose throw rugs in walkways, pet beds, electrical cords, etc? Yes  Adequate lighting in your home to reduce risk of falls? Yes   ASSISTIVE DEVICES UTILIZED TO PREVENT FALLS:  Life alert? No  Use of a cane, walker or w/c? No  Grab bars in the bathroom? Yes  Shower chair or bench in shower? Yes  Elevated toilet seat or a handicapped toilet? Yes   TIMED UP AND GO:  Was the test performed? No . Telephonic visit  Cognitive Function:      6CIT Screen 06/08/2020 04/05/2019  What Year? 0 points 0 points  What month? 0 points 0 points  What time? 0 points 0 points  Count back from 20 0 points 0 points  Months in reverse 0 points 0 points  Repeat phrase 2 points 0 points  Total Score 2 0    Immunizations Immunization History  Administered Date(s) Administered  . Fluad Quad(high Dose 65+) 11/08/2019  . Hepatitis A 11/09/2007  . Hepatitis A, Adult 11/09/2007  . IPV 11/09/2007  . Influenza,inj,quad, With Preservative 11/14/2018  . Influenza-Unspecified 11/08/2019  . PFIZER(Purple Top)SARS-COV-2 Vaccination 09/23/2019  . Pneumococcal Conjugate-13 11/18/2016  . Pneumococcal Polysaccharide-23 10/25/2018  . Td  01/13/2005  . Tdap 10/24/2008  . Typhoid Live 11/09/2007    TDAP status: Due, Education has been provided regarding the importance of this vaccine. Advised may receive this vaccine  at local pharmacy or Health Dept. Aware to provide a copy of the vaccination record if obtained from local pharmacy or Health Dept. Verbalized acceptance and understanding.  Flu Vaccine status: Up to date  Pneumococcal vaccine status: Up to date  Covid-19 vaccine status: Declined, Education has been provided regarding the importance of this vaccine but patient still declined. Advised may receive this vaccine at local pharmacy or Health Dept.or vaccine clinic. Aware to provide a copy of the vaccination record if obtained from local pharmacy or Health Dept. Verbalized acceptance and understanding.  Qualifies for Shingles Vaccine? Yes   Zostavax completed No   Shingrix Completed?: No.    Education has been provided regarding the importance of this vaccine. Patient has been advised to call insurance company to determine out of pocket expense if they have not yet received this vaccine. Advised may also receive vaccine at local pharmacy or Health Dept. Verbalized acceptance and understanding.  Screening Tests Health Maintenance  Topic Date Due  . Zoster Vaccines- Shingrix (1 of 2) Never done  . COVID-19 Vaccine (2 - Pfizer risk 4-dose series) 10/14/2019  . TETANUS/TDAP  11/07/2020 (Originally 10/25/2018)  . Hepatitis C Screening  05/11/2021 (Originally 07/16/1968)  . INFLUENZA VACCINE  08/13/2020  . COLONOSCOPY (Pts 45-58yrs Insurance coverage will need to be confirmed)  10/24/2020  . MAMMOGRAM  11/27/2020  . DEXA SCAN  01/27/2021  . PNA vac Low Risk Adult  Completed  . HPV VACCINES  Aged Out    Health Maintenance  Health Maintenance Due  Topic Date Due  . Zoster Vaccines- Shingrix (1 of 2) Never done  . COVID-19 Vaccine (2 - Pfizer risk 4-dose series) 10/14/2019    Colorectal cancer screening: Type of screening: Colonoscopy. Completed 10/25/2010. Repeat every 10 years  Mammogram status: Completed 11/28/2019. Repeat every year  Bone Density status: Completed 01/28/2019. Results  reflect: Bone density results: OSTEOPENIA. Repeat every 2 years.  Lung Cancer Screening: (Low Dose CT Chest recommended if Age 30-80 years, 30 pack-year currently smoking OR have quit w/in 15years.) does not qualify.   Additional Screening:  Hepatitis C Screening: does qualify; Draw at next visit  Vision Screening: Recommended annual ophthalmology exams for early detection of glaucoma and other disorders of the eye. Is the patient up to date with their annual eye exam?  No  Who is the provider or what is the name of the office in which the patient attends annual eye exams? No eye doctor yet If pt is not established with a provider, would they like to be referred to a provider to establish care? No .   Dental Screening: Recommended annual dental exams for proper oral hygiene  Community Resource Referral / Chronic Care Management: CRR required this visit?  No   CCM required this visit?  No      Plan:     I have personally reviewed and noted the following in the patient's chart:   . Medical and social history . Use of alcohol, tobacco or illicit drugs  . Current medications and supplements including opioid prescriptions.  . Functional ability and status . Nutritional status . Physical activity . Advanced directives . List of other physicians . Hospitalizations, surgeries, and ER visits in previous 12 months . Vitals . Screenings to include cognitive, depression, and falls . Referrals and  appointments  In addition, I have reviewed and discussed with patient certain preventive protocols, quality metrics, and best practice recommendations. A written personalized care plan for preventive services as well as general preventive health recommendations were provided to patient.     [place] Constable, LPN   [id number]   Nurse Notes: None

## 2020-06-08 NOTE — Patient Instructions (Signed)
Denise Benton , Thank you for taking time to come for your Medicare Wellness Visit. I appreciate your ongoing commitment to your health goals. Please review the following plan we discussed and let me know if I can assist you in the future.   Screening recommendations/referrals: Colonoscopy: Done 10/25/2010 - Repeat in 10 years Mammogram: Done 11/28/2019 - Repeat annually Bone Density: Done 01/28/2019 - Repeat every 2 years Recommended yearly ophthalmology/optometry visit for glaucoma screening and checkup Recommended yearly dental visit for hygiene and checkup  Vaccinations: Influenza vaccine: Done 11/08/2019 - Repeat annually Pneumococcal vaccine: Done 11/18/2016 & 10/25/2018 Tdap vaccine: Done 10/24/2008 *Past DUE (every 10 years) Shingles vaccine: Declined   Covid-19: one done 09/2019 - allergic reaction - contraindicated to repeat  Advanced directives: Please bring a copy of your health care power of attorney and living will to the office to be added to your chart at your convenience.  Conditions/risks identified: Aim for 30 minutes of exercise or brisk walking each day, drink 6-8 glasses of water and eat lots of fruits and vegetables.  Next appointment: Follow up in one year for your annual wellness visit    Preventive Care 65 Years and Older, Female Preventive care refers to lifestyle choices and visits with your health care provider that can promote health and wellness. What does preventive care include?  A yearly physical exam. This is also called an annual well check.  Dental exams once or twice a year.  Routine eye exams. Ask your health care provider how often you should have your eyes checked.  Personal lifestyle choices, including:  Daily care of your teeth and gums.  Regular physical activity.  Eating a healthy diet.  Avoiding tobacco and drug use.  Limiting alcohol use.  Practicing safe sex.  Taking low-dose aspirin every day.  Taking vitamin and mineral  supplements as recommended by your health care provider. What happens during an annual well check? The services and screenings done by your health care provider during your annual well check will depend on your age, overall health, lifestyle risk factors, and family history of disease. Counseling  Your health care provider may ask you questions about your:  Alcohol use.  Tobacco use.  Drug use.  Emotional well-being.  Home and relationship well-being.  Sexual activity.  Eating habits.  History of falls.  Memory and ability to understand (cognition).  Work and work Astronomer.  Reproductive health. Screening  You may have the following tests or measurements:  Height, weight, and BMI.  Blood pressure.  Lipid and cholesterol levels. These may be checked every 5 years, or more frequently if you are over 26 years old.  Skin check.  Lung cancer screening. You may have this screening every year starting at age 28 if you have a 30-pack-year history of smoking and currently smoke or have quit within the past 15 years.  Fecal occult blood test (FOBT) of the stool. You may have this test every year starting at age 43.  Flexible sigmoidoscopy or colonoscopy. You may have a sigmoidoscopy every 5 years or a colonoscopy every 10 years starting at age 75.  Hepatitis C blood test.  Hepatitis B blood test.  Sexually transmitted disease (STD) testing.  Diabetes screening. This is done by checking your blood sugar (glucose) after you have not eaten for a while (fasting). You may have this done every 1-3 years.  Bone density scan. This is done to screen for osteoporosis. You may have this done starting at age 71.  Mammogram. This may be done every 1-2 years. Talk to your health care provider about how often you should have regular mammograms. Talk with your health care provider about your test results, treatment options, and if necessary, the need for more tests. Vaccines  Your  health care provider may recommend certain vaccines, such as:  Influenza vaccine. This is recommended every year.  Tetanus, diphtheria, and acellular pertussis (Tdap, Td) vaccine. You may need a Td booster every 10 years.  Zoster vaccine. You may need this after age 51.  Pneumococcal 13-valent conjugate (PCV13) vaccine. One dose is recommended after age 88.  Pneumococcal polysaccharide (PPSV23) vaccine. One dose is recommended after age 26. Talk to your health care provider about which screenings and vaccines you need and how often you need them. This information is not intended to replace advice given to you by your health care provider. Make sure you discuss any questions you have with your health care provider. Document Released: 01/26/2015 Document Revised: 09/19/2015 Document Reviewed: 10/31/2014 Elsevier Interactive Patient Education  2017 [place] Prevention in the Home Falls can cause injuries. They can happen to people of all ages. There are many things you can do to make your home safe and to help prevent falls. What can I do on the outside of my home?  Regularly fix the edges of walkways and driveways and fix any cracks.  Remove anything that might make you trip as you walk through a door, such as a raised step or threshold.  Trim any bushes or trees on the path to your home.  Use bright outdoor lighting.  Clear any walking paths of anything that might make someone trip, such as rocks or tools.  Regularly check to see if handrails are loose or broken. Make sure that both sides of any steps have handrails.  Any raised decks and porches should have guardrails on the edges.  Have any leaves, snow, or ice cleared regularly.  Use sand or salt on walking paths during winter.  Clean up any spills in your garage right away. This includes oil or grease spills. What can I do in the bathroom?  Use night lights.  Install grab bars by the toilet and in the tub and  shower. Do not use towel bars as grab bars.  Use non-skid mats or decals in the tub or shower.  If you need to sit down in the shower, use a plastic, non-slip stool.  Keep the floor dry. Clean up any water that spills on the floor as soon as it happens.  Remove soap buildup in the tub or shower regularly.  Attach bath mats securely with double-sided non-slip rug tape.  Do not have throw rugs and other things on the floor that can make you trip. What can I do in the bedroom?  Use night lights.  Make sure that you have a light by your bed that is easy to reach.  Do not use any sheets or blankets that are too big for your bed. They should not hang down onto the floor.  Have a firm chair that has side arms. You can use this for support while you get dressed.  Do not have throw rugs and other things on the floor that can make you trip. What can I do in the kitchen?  Clean up any spills right away.  Avoid walking on wet floors.  Keep items that you use a lot in easy-to-reach places.  If you need to reach  something above you, use a strong step stool that has a grab bar.  Keep electrical cords out of the way.  Do not use floor polish or wax that makes floors slippery. If you must use wax, use non-skid floor wax.  Do not have throw rugs and other things on the floor that can make you trip. What can I do with my stairs?  Do not leave any items on the stairs.  Make sure that there are handrails on both sides of the stairs and use them. Fix handrails that are broken or loose. Make sure that handrails are as long as the stairways.  Check any carpeting to make sure that it is firmly attached to the stairs. Fix any carpet that is loose or worn.  Avoid having throw rugs at the top or bottom of the stairs. If you do have throw rugs, attach them to the floor with carpet tape.  Make sure that you have a light switch at the top of the stairs and the bottom of the stairs. If you do not  have them, ask someone to add them for you. What else can I do to help prevent falls?  Wear shoes that:  Do not have high heels.  Have rubber bottoms.  Are comfortable and fit you well.  Are closed at the toe. Do not wear sandals.  If you use a stepladder:  Make sure that it is fully opened. Do not climb a closed stepladder.  Make sure that both sides of the stepladder are locked into place.  Ask someone to hold it for you, if possible.  Clearly mark and make sure that you can see:  Any grab bars or handrails.  First and last steps.  Where the edge of each step is.  Use tools that help you move around (mobility aids) if they are needed. These include:  Canes.  Walkers.  Scooters.  Crutches.  Turn on the lights when you go into a dark area. Replace any light bulbs as soon as they burn out.  Set up your furniture so you have a clear path. Avoid moving your furniture around.  If any of your floors are uneven, fix them.  If there are any pets around you, be aware of where they are.  Review your medicines with your doctor. Some medicines can make you feel dizzy. This can increase your chance of falling. Ask your doctor what other things that you can do to help prevent falls. This information is not intended to replace advice given to you by your health care provider. Make sure you discuss any questions you have with your health care provider. Document Released: 10/26/2008 Document Revised: 06/07/2015 Document Reviewed: 02/03/2014 Elsevier Interactive Patient Education  2017 [employer].

## 2020-06-12 ENCOUNTER — Other Ambulatory Visit: Payer: Self-pay

## 2020-06-12 ENCOUNTER — Ambulatory Visit (INDEPENDENT_AMBULATORY_CARE_PROVIDER_SITE_OTHER): Payer: Medicare Other | Admitting: Family Medicine

## 2020-06-12 ENCOUNTER — Encounter: Payer: Self-pay | Admitting: Family Medicine

## 2020-06-12 VITALS — BP 109/71 | HR 57 | Temp 95.5°F | Ht 61.0 in | Wt 116.8 lb

## 2020-06-12 DIAGNOSIS — J4530 Mild persistent asthma, uncomplicated: Secondary | ICD-10-CM | POA: Diagnosis not present

## 2020-06-12 MED ORDER — MONTELUKAST SODIUM 10 MG PO TABS: 10.0000 mg | ORAL_TABLET | Freq: Every day | ORAL | 2 refills | Status: DC

## 2020-06-12 NOTE — Progress Notes (Signed)
Virtual Visit via Telephone Note  I connected with  Denise Benton on 06/12/20 at  9:00 AM EDT by telephone and verified that I am speaking with the correct person using two identifiers.  Location: Patient: Home Provider: [facility] Persons participating in the virtual visit: patient/Nurse Health Advisor   I discussed the limitations, risks, security and privacy concerns of performing an evaluation and management service by telephone and the availability of in person appointments. The patient expressed understanding and agreed to proceed.  Interactive audio and video telecommunications were attempted between this nurse and patient, however failed, due to patient having technical difficulties OR patient did not have access to video capability.  We continued and completed visit with audio only.  Some vital signs may be absent or patient reported.   Maryann Mccall Hurman Horn, LPN

## 2020-06-12 NOTE — Progress Notes (Signed)
Assessment & Plan:  1. Mild persistent asthma without complication Uncontrolled. Encouraged to use Albuterol prior to activity that makes her short of breath (such as gardening), use Advair daily instead of as needed, add Xyzal at night, add Flonase in the AM, and start Singulair at night.  - montelukast (SINGULAIR) 10 MG tablet; Take 1 tablet (10 mg total) by mouth at bedtime.  Dispense: 30 tablet; Refill: 2   Return in about 6 weeks (around 07/24/2020) for SOB.  Denise Boston, MSN, APRN, FNP-C [facility]  Subjective:    Patient ID: Denise Benton, female    DOB: 1950/09/10, 70 y.o.   MRN: [id number]  Patient Care Team: Denise Fudge, FNP as PCP - General (Family Medicine) Denise Natal, NP as Nurse Practitioner (Gastroenterology) Denise Hail, MD (Chiropractic Medicine) Denise Cowden, MD as Consulting Physician (Pulmonary Disease) Denise Halon, MD as Consulting Physician (Otolaryngology)   Chief Complaint:  Chief Complaint  Patient presents with  . Shortness of Breath    Patient states it been going on for months     HPI: [name] is a 70 y.o. female presenting on 06/12/2020 for Shortness of Breath (Patient states it been going on for months )  Patient is following up on shortness of breath.  She was diagnosed previously with asthma.  She went to see Dr. Sherene Benton, pulmonologist, in February.  He was getting records for her previous PFTs; she has not heard anything back about this.  He advised that she try using albuterol 15 minutes before an activity that she knows is going to make her short of breath; she has not tried this.  He also discussed GERD as a common cause of respiratory symptoms like hers.  Patient states she has really been watching her diet.  She is not having any symptoms of acid reflux.  She was previously on Nexium and states she was still having shortness of breath when she was taking it.  She does not like to be  on PPIs for long periods of time.  She reports she takes a sinus medication at bedtime, which allows her to sleep until 3 AM when it wears off and she wakes up coughing.  She is taking Delsym at 3 AM for the cough.  States that she has tried an oral antihistamine in the past which was not helpful.  Feels like she has a lot of mucus in her throat.  She takes Advair and albuterol, both as needed.  New complaints: None  Social history:  Relevant past medical, surgical, family and social history reviewed and updated as indicated. Interim medical history since our last visit reviewed.  Allergies and medications reviewed and updated.  DATA REVIEWED: CHART IN EPIC  ROS: Negative unless specifically indicated above in HPI.    Current Outpatient Medications:  .  acetaminophen (TYLENOL) 325 MG tablet, Take 325 mg by mouth as needed., Disp: , Rfl:  .  Alum & Mag Hydroxide-Simeth (MYLANTA PO), Take by mouth as needed. , Disp: , Rfl:  .  aspirin EC 81 MG tablet, Take 81 mg by mouth once a week., Disp: , Rfl:  .  B Complex Vitamins (VITAMIN-B COMPLEX PO), Take 1 tablet by mouth daily., Disp: , Rfl:  .  Biotin 99833 MCG TABS, Take by mouth daily., Disp: , Rfl:  .  Calcium Carbonate (CALTRATE 600 PO), Take 1 tablet by mouth. Bi weekly, Disp: , Rfl:  .  Cholecalciferol (VITAMIN D) 50  MCG (2000 UT) CAPS, Take by mouth daily. , Disp: , Rfl:  .  Docosahexaenoic Acid (DHA PO), Take 1 tablet by mouth once a week. , Disp: , Rfl:  .  ELDERBERRY PO, Take 1 Dose by mouth as needed., Disp: , Rfl:  .  fluticasone-salmeterol (ADVAIR HFA) 115-21 MCG/ACT inhaler, Inhale 2 puffs into the lungs 2 (two) times daily. As needed, Disp: , Rfl:  .  folic acid (FOLVITE) 800 MCG tablet, Take 400 mcg by mouth once a week. , Disp: , Rfl:  .  levOCARNitine-E-Coenzyme Q10 (CO Q-10 PLUS L-CARNITINE PO), Take 1 tablet by mouth daily., Disp: , Rfl:  .  Multiple Vitamin (MULTIVITAMIN ADULT PO), Take by mouth., Disp: , Rfl:  .   Multiple Vitamins-Minerals (EMERGEN-C IMMUNE PO), Take 1 tablet by mouth as needed., Disp: , Rfl:  .  Omega-3 Fatty Acids (EPA PO), Take 1 tablet by mouth 3 (three) times a week. , Disp: , Rfl:  .  albuterol (VENTOLIN HFA) 108 (90 Base) MCG/ACT inhaler, Inhale into the lungs., Disp: , Rfl:    Allergies  Allergen Reactions  . Ivp Dye [Iodinated Diagnostic Agents] Other (See Comments)    Respiratory distress  . Codeine Nausea Only  . Niaspan [Niacin Er] Rash and Other (See Comments)    Nightmares   Past Medical History:  Diagnosis Date  . Allergy    seasonal  . Arthritis   . Asthma   . Duodenal ulcer   . Gallstones   . GERD (gastroesophageal reflux disease)   . HLD (hyperlipidemia)   . IBS (irritable bowel syndrome)   . Kidney stones   . Lumbosacral disc disease    L4-5, L5-S1  . Osteopenia     Past Surgical History:  Procedure Laterality Date  . BREAST CYST ASPIRATION Right    pt has cysts aspiration done for lumps  . CESAREAN SECTION  1980   x2; again in 1984  . LAPAROSCOPIC CHOLECYSTECTOMY  2000  . SKIN LESION EXCISION  07/2011   from scalp - keratosis    Social History   Socioeconomic History  . Marital status: Married    Spouse name: Denise Benton   . Number of children: 2  . Years of education: 4 years college  . Highest education level: Bachelor's degree (e.g., BA, AB, BS)  Occupational History  . Occupation: Retired RN/NP  . Occupation: Works part time at [employer]  . Smoking status: Former Smoker    Packs/day: 0.50    Years: 1.00    Pack years: 0.50    Types: Cigarettes    Quit date: 01/13/1969    Years since quitting: 51.4  . Smokeless tobacco: Never Used  Vaping Use  . Vaping Use: Never used  Substance and Sexual Activity  . Alcohol use: No  . Drug use: No  . Sexual activity: Not Currently  Other Topics Concern  . Not on file  Social History Narrative   Lives home with husband - daughter in [place] here from [place] in  2020   Social Determinants of Health   Financial Resource Strain: Low Risk   . Difficulty of Paying Living Expenses: Not hard at all  Food Insecurity: No Food Insecurity  . Worried About Programme researcher, broadcasting/film/video in the Last Year: Never true  . Ran Out of Food in the Last Year: Never true  Transportation Needs: No Transportation Needs  . Lack of Transportation (Medical): No  . Lack of  Transportation (Non-Medical): No  Physical Activity: Sufficiently Active  . Days of Exercise per Week: 7 days  . Minutes of Exercise per Session: 30 min  Stress: No Stress Concern Present  . Feeling of Stress : Not at all  Social Connections: Moderately Integrated  . Frequency of Communication with Friends and Family: More than three times a week  . Frequency of Social Gatherings with Friends and Family: More than three times a week  . Attends Religious Services: 1 to 4 times per year  . Active Member of Clubs or Organizations: No  . Attends BankerClub or Organization Meetings: Never  . Marital Status: Married  Catering managerntimate Partner Violence: Not At Risk  . Fear of Current or Ex-Partner: No  . Emotionally Abused: No  . Physically Abused: No  . Sexually Abused: No        Objective:    BP 109/71   Pulse (!) 57   Temp (!) 95.5 F (35.3 C) (Temporal)   Ht 5\' 1"  (1.549 m)   Wt 116 lb 12.8 oz (53 kg)   SpO2 100%   BMI 22.07 kg/m   Wt Readings from Last 3 Encounters:  06/12/20 116 lb 12.8 oz (53 kg)  06/08/20 118 lb (53.5 kg)  05/11/20 119 lb 3.2 oz (54.1 kg)    Physical Exam Vitals reviewed.  Constitutional:      General: She is not in acute distress.    Appearance: Normal appearance. She is normal weight. She is not ill-appearing, toxic-appearing or diaphoretic.  HENT:     Head: Normocephalic and atraumatic.  Eyes:     General: No scleral icterus.       Right eye: No discharge.        Left eye: No discharge.     Conjunctiva/sclera: Conjunctivae normal.  Cardiovascular:     Rate and Rhythm:  Normal rate and regular rhythm.     Heart sounds: Normal heart sounds. No murmur heard. No friction rub. No gallop.   Pulmonary:     Effort: Pulmonary effort is normal. No respiratory distress.     Breath sounds: Normal breath sounds. No stridor. No wheezing, rhonchi or rales.  Musculoskeletal:        General: Normal range of motion.     Cervical back: Normal range of motion.  Skin:    General: Skin is warm and dry.     Capillary Refill: Capillary refill takes less than 2 seconds.  Neurological:     General: No focal deficit present.     Mental Status: She is alert and oriented to person, place, and time. Mental status is at baseline.  Psychiatric:        Mood and Affect: Mood normal.        Behavior: Behavior normal.        Thought Content: Thought content normal.        Judgment: Judgment normal.     Lab Results  Component Value Date   TSH 4.170 05/02/2019   Lab Results  Component Value Date   WBC 6.6 03/22/2019   HGB 13.1 03/22/2019   HCT 39.0 03/22/2019   MCV 92.9 03/22/2019   PLT 292.0 03/22/2019   Lab Results  Component Value Date   NA 141 11/09/2019   K 4.6 11/09/2019   CO2 28 11/09/2019   GLUCOSE 73 11/09/2019   BUN 19 11/09/2019   CREATININE 0.95 11/09/2019   BILITOT 0.8 11/09/2019   ALKPHOS 89 11/09/2019   AST 25 11/09/2019  ALT 17 11/09/2019   PROT 6.8 11/09/2019   ALBUMIN 4.5 11/09/2019   CALCIUM 9.7 11/09/2019   GFR 69.19 03/22/2019   Lab Results  Component Value Date   CHOL 237 (H) 11/09/2019   Lab Results  Component Value Date   HDL 79 11/09/2019   Lab Results  Component Value Date   LDLCALC 141 (H) 11/09/2019   Lab Results  Component Value Date   TRIG 98 11/09/2019   Lab Results  Component Value Date   CHOLHDL 3.0 11/09/2019   No results found for: HGBA1C

## 2020-06-14 ENCOUNTER — Telehealth: Payer: Self-pay | Admitting: Internal Medicine

## 2020-06-14 NOTE — Telephone Encounter (Signed)
Called and left message on voicemail to please return phone call. Contact number provided. 

## 2020-06-14 NOTE — Telephone Encounter (Signed)
Will forward message to JS to have her follow up on form in RDS.  Pt stated that it was mailed into the office.

## 2020-06-15 NOTE — Telephone Encounter (Signed)
Called and spoke with patient who wanted to know if we received the medical records from [facility]. Patient informed that it was received and reviewed by Dr Sherene Sires on 03/23/20 and in the media section of the chart (scanned in). Patient requesting copy to be mailed to her. Confirmed address and patient aware it will be mailed out on Monday 06/18/20. Nothing further needed at this time.

## 2020-08-06 ENCOUNTER — Encounter: Payer: Self-pay | Admitting: Family Medicine

## 2020-08-06 ENCOUNTER — Ambulatory Visit (INDEPENDENT_AMBULATORY_CARE_PROVIDER_SITE_OTHER): Payer: Medicare Other | Admitting: Family Medicine

## 2020-08-06 DIAGNOSIS — R399 Unspecified symptoms and signs involving the genitourinary system: Secondary | ICD-10-CM

## 2020-08-06 LAB — URINALYSIS, ROUTINE W REFLEX MICROSCOPIC
Bilirubin, UA: NEGATIVE
Glucose, UA: NEGATIVE
Ketones, UA: NEGATIVE
Leukocytes,UA: NEGATIVE
Nitrite, UA: NEGATIVE
Protein,UA: NEGATIVE
RBC, UA: NEGATIVE
Specific Gravity, UA: 1.025 (ref 1.005–1.030)
Urobilinogen, Ur: 0.2 mg/dL (ref 0.2–1.0)
pH, UA: 5.5 (ref 5.0–7.5)

## 2020-08-06 NOTE — Progress Notes (Signed)
   Virtual Visit  Note Due to COVID-19 pandemic this visit was conducted virtually. This visit type was conducted due to national recommendations for restrictions regarding the COVID-19 Pandemic (e.g. social distancing, sheltering in place) in an effort to limit this patient's exposure and mitigate transmission in our community. All issues noted in this document were discussed and addressed.  A physical exam was not performed with this format.  I connected with Denise Benton on 08/06/20 at 0822 by telephone and verified that I am speaking with the correct person using two identifiers. Denise Benton is currently located at home and her husband is currently with her during the visit. The provider, Gabriel Earing, FNP is located in their office at time of visit.  I discussed the limitations, risks, security and privacy concerns of performing an evaluation and management service by telephone and the availability of in person appointments. I also discussed with the patient that there may be a patient responsible charge related to this service. The patient expressed understanding and agreed to proceed.  CC: UTI symptoms  History and Present Illness:  HPI Denise Benton reports urinary frequent for 10-14 days. She reports dysuria for 4 days and some mild nausea. Denies flank pain, abdominal pain, vomiting, fever, or chills. She has been doing a lot of work outside in the heat and has been trying to stay well hydrated. She is able to bring a specimen for testing.     ROS As per HPI.   Observations/Objective: Alert and oriented x 3. Able to speak in full sentences without difficulty.    Assessment and Plan: Alert and oriented x 3. Able to speak in full sentences without difficulty.   Urine dipstick shows negative for all components.  Follow Up Instructions: Denise was seen today for urinary tract infection.  Diagnoses and all orders for this visit:  UTI symptoms UA negative today.  Discussed hydration, AZO OTC. Return to office for new or worsening symptoms, or if symptoms persist.  -     Urine Culture -     Urinalysis, Routine w reflex microscopic   I discussed the assessment and treatment plan with the patient. The patient was provided an opportunity to ask questions and all were answered. The patient agreed with the plan and demonstrated an understanding of the instructions.   The patient was advised to call back or seek an in-person evaluation if the symptoms worsen or if the condition fails to improve as anticipated.  The above assessment and management plan was discussed with the patient. The patient verbalized understanding of and has agreed to the management plan. Patient is aware to call the clinic if symptoms persist or worsen. Patient is aware when to return to the clinic for a follow-up visit. Patient educated on when it is appropriate to go to the emergency department.   Time call ended:  0833  I provided 11 minutes of  non face-to-face time during this encounter.    Gabriel Earing, FNP

## 2020-08-07 LAB — URINE CULTURE

## 2020-10-07 IMAGING — RF DG SWALLOWING FUNCTION
12 of 13 series · 12 of 24 positions shown · non-contrast
Comparison: None.

CLINICAL DATA: Dysphagia.

EXAM:
MODIFIED BARIUM SWALLOW
TECHNIQUE: Different consistencies of barium were administered orally to the
patient by the Speech Pathologist. Imaging of the pharynx was
performed in the lateral projection. The radiologist was present in
the fluoroscopy room for this study, providing personal supervision.
FLUOROSCOPY TIME:  Fluoroscopy Time:  1 minutes 20 seconds
Radiation Exposure Index (if provided by the fluoroscopic device):
14.81 mGy

[Series 1: run · 1 of 13 frames shown (1 of 12)]
[frame 7/13]
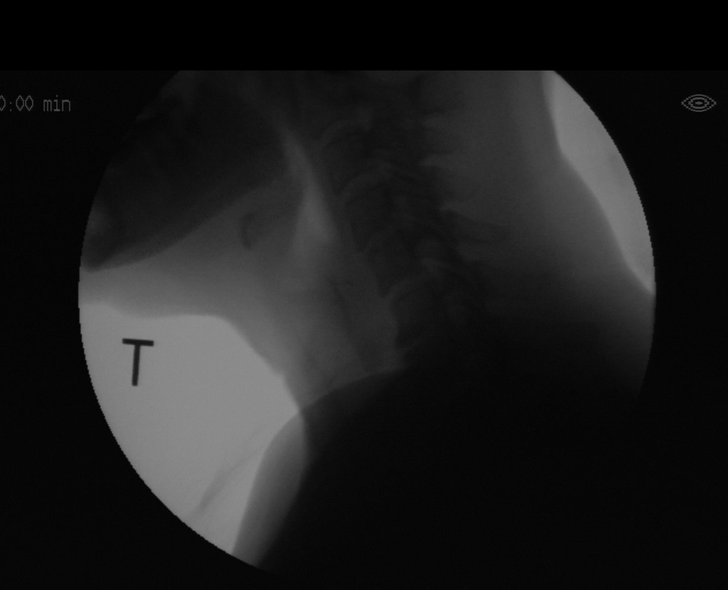

[Series 2: run · 1 of 135 frames shown (2 of 12)]
[frame 115/135]
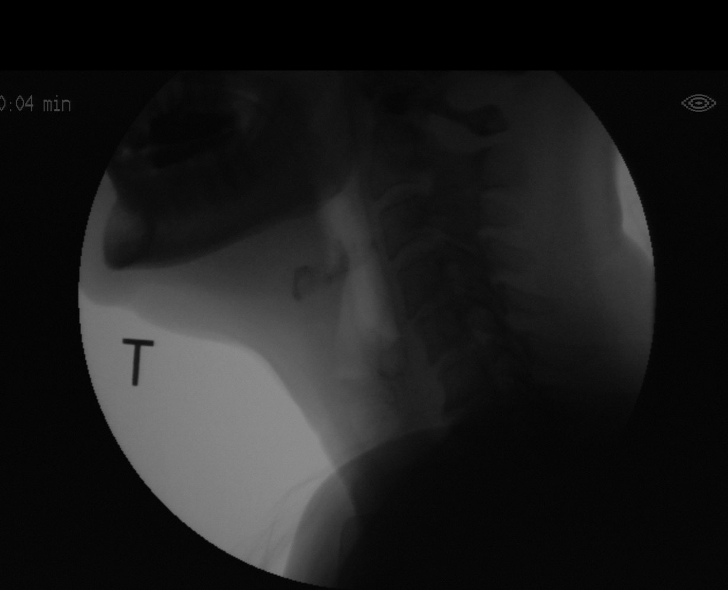

[Series 3: run · 1 of 269 frames shown (3 of 12)]
[frame 229/269]
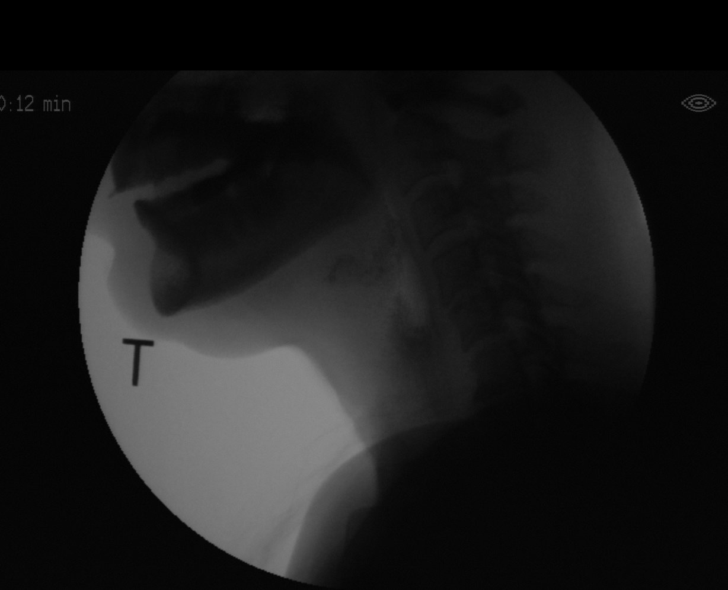

[Series 4: run · 1 of 160 frames shown (4 of 12)]
[frame 137/160]
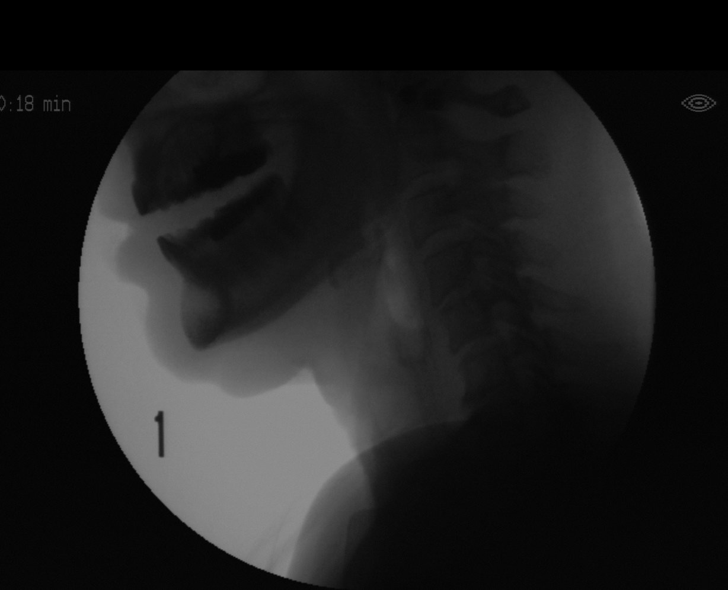

[Series 6: run · 1 of 294 frames shown (5 of 12)]
[frame 1/294]
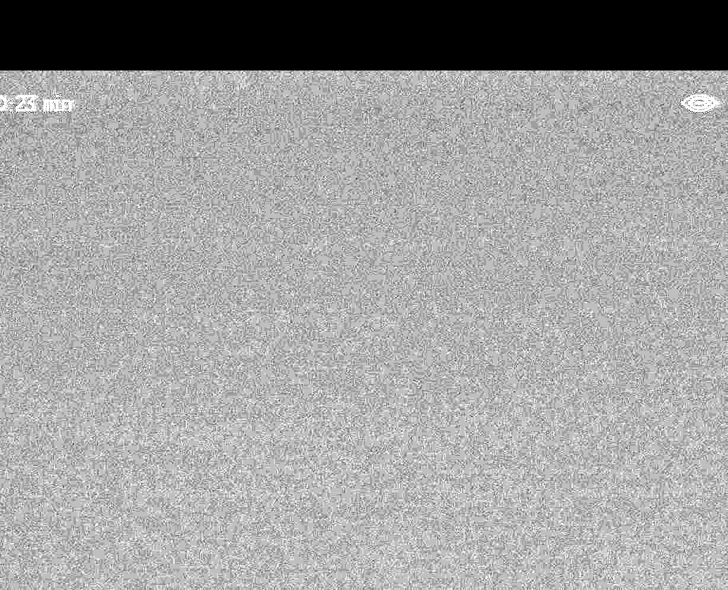

[Series 7: run · 1 of 34 frames shown (6 of 12)]
[frame 6/34]
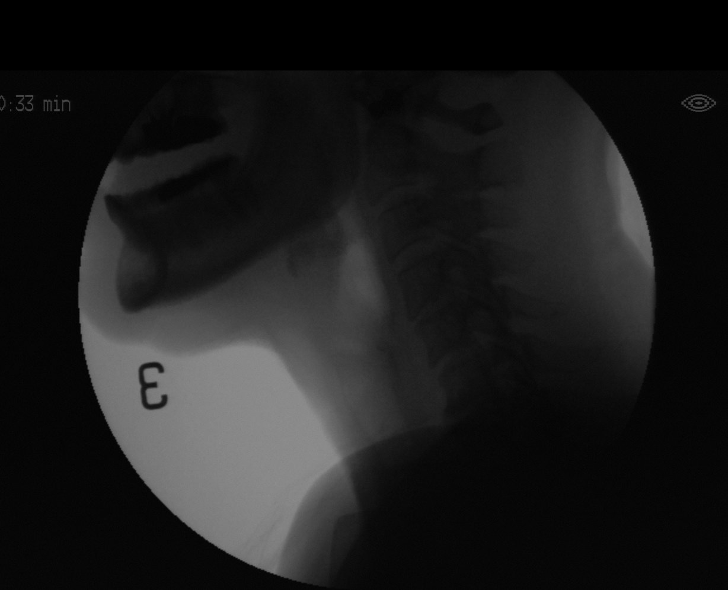

[Series 8: run · 1 of 579 frames shown (7 of 12)]
[frame 282/579]
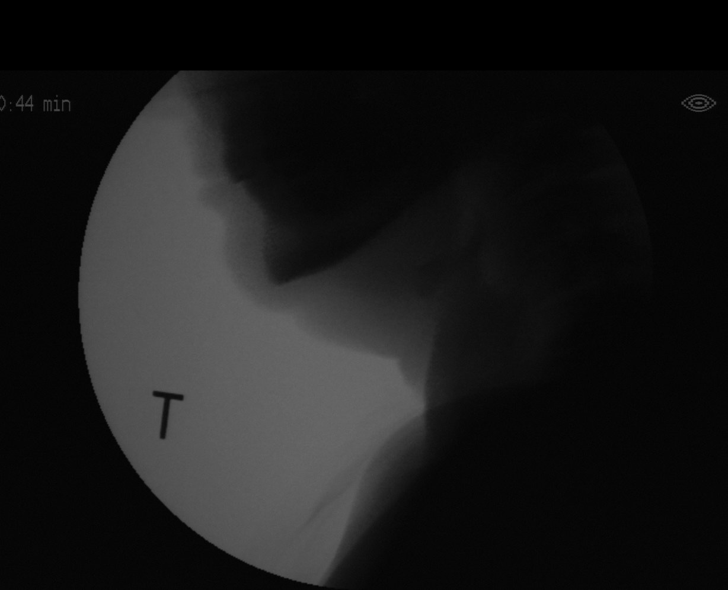

[Series 9: run · 1 of 183 frames shown (8 of 12)]
[frame 156/183]
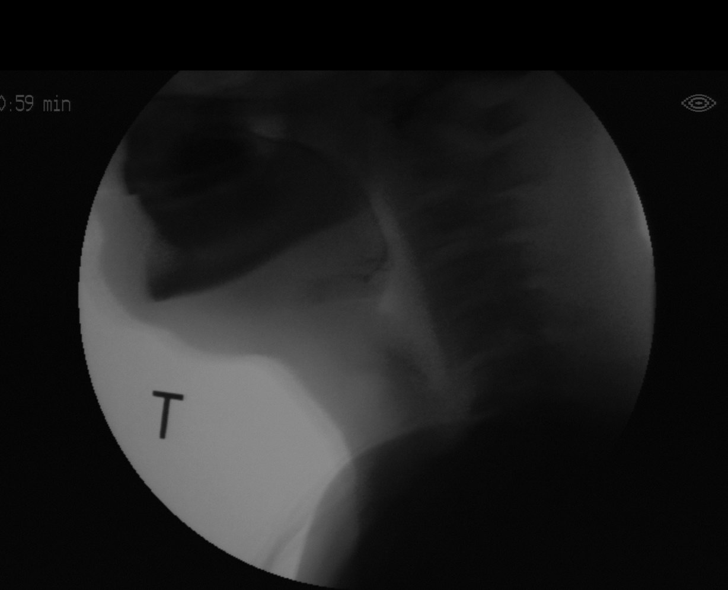

[Series 10: run · 1 of 77 frames shown (9 of 12)]
[frame 39/77]
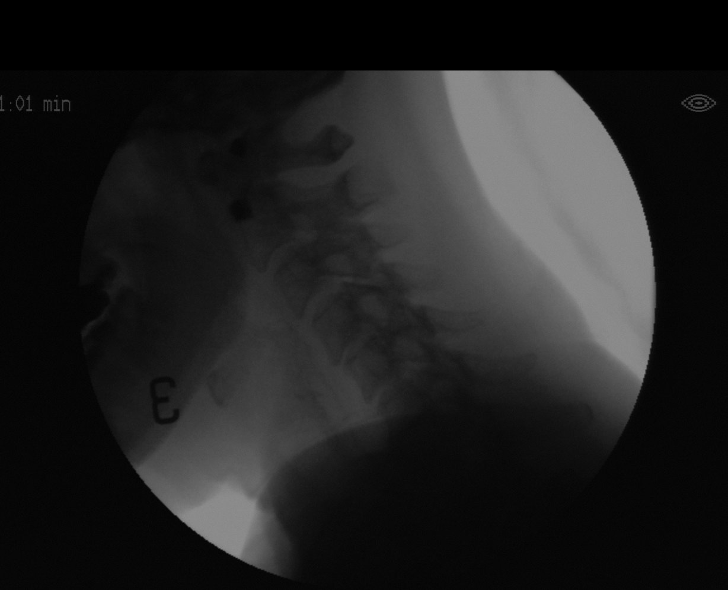

[Series 11: run · 1 of 74 frames shown (10 of 12)]
[frame 38/74]
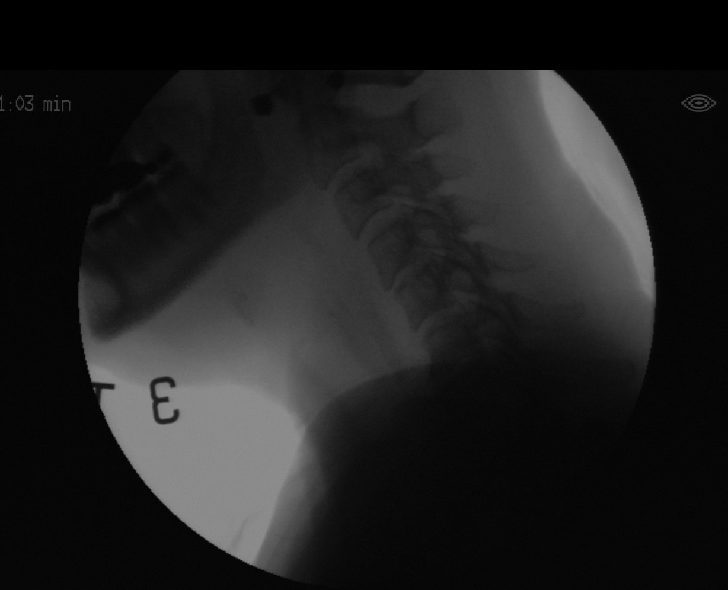

[Series 12: run · 1 of 167 frames shown (11 of 12)]
[frame 142/167]
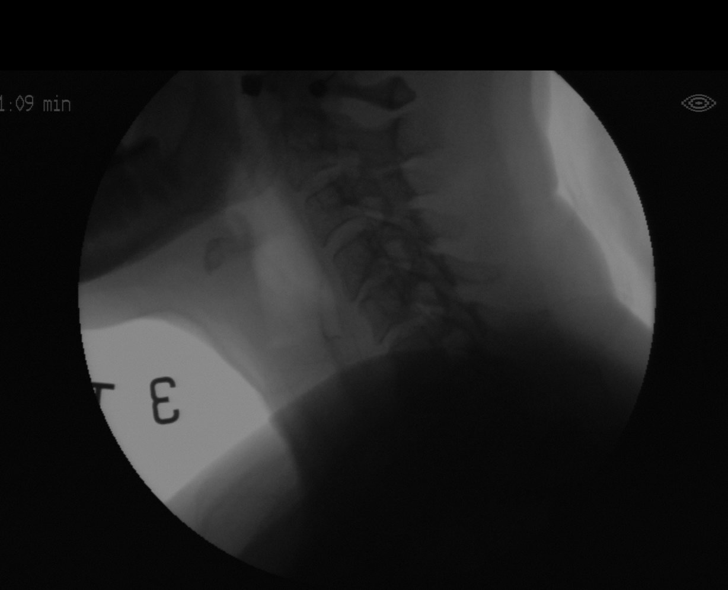

[Series 13: run · 1 of 304 frames shown (12 of 12)]
[frame 259/304]
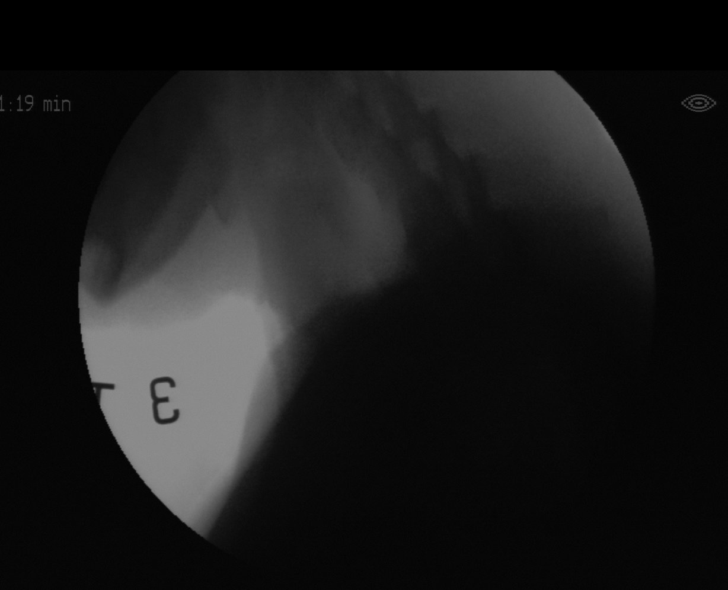

[12 of 24 positions shown; findings below may reference images not displayed]

FINDINGS: No appreciable abnormalities.
IMPRESSION: Normal swallowing function study.

Please refer to the Speech Pathologists report for complete details
and recommendations.

## 2020-11-06 ENCOUNTER — Ambulatory Visit (INDEPENDENT_AMBULATORY_CARE_PROVIDER_SITE_OTHER): Payer: Medicare Other | Admitting: Family Medicine

## 2020-11-06 DIAGNOSIS — J029 Acute pharyngitis, unspecified: Secondary | ICD-10-CM | POA: Diagnosis not present

## 2020-11-06 DIAGNOSIS — J019 Acute sinusitis, unspecified: Secondary | ICD-10-CM

## 2020-11-06 LAB — VERITOR FLU A/B WAIVED
Influenza A: NEGATIVE
Influenza B: NEGATIVE

## 2020-11-06 MED ORDER — PREDNISONE 10 MG (21) PO TBPK: ORAL_TABLET | ORAL | 0 refills | Status: DC

## 2020-11-06 MED ORDER — AMOXICILLIN-POT CLAVULANATE 875-125 MG PO TABS: 1.0000 | ORAL_TABLET | Freq: Two times a day (BID) | ORAL | 0 refills | Status: AC

## 2020-11-06 NOTE — Progress Notes (Signed)
Virtual Visit via Telephone Note  I connected with Denise Benton on 11/06/20 at 11:12 AM by telephone and verified that I am speaking with the correct person using two identifiers. Denise Benton is currently located at home and her husband is currently with her during this visit. The provider, Gwenlyn Fudge, FNP is located in their office at time of visit.  I discussed the limitations, risks, security and privacy concerns of performing an evaluation and management service by telephone and the availability of in person appointments. I also discussed with the patient that there may be a patient responsible charge related to this service. The patient expressed understanding and agreed to proceed.  Subjective: PCP: Gwenlyn Fudge, FNP  Chief Complaint  Patient presents with   URI   Patient complains of head congestion, headache, sore throat, ear pain/pressure, facial pain/pressure, and postnasal drainage. Onset of symptoms was this morning. She is drinking plenty of fluids. Evaluation to date: none. Treatment to date:  vitamin C . She has a history of asthma. She does not smoke.    ROS: Per HPI  Current Outpatient Medications:    acetaminophen (TYLENOL) 325 MG tablet, Take 325 mg by mouth as needed., Disp: , Rfl:    albuterol (VENTOLIN HFA) 108 (90 Base) MCG/ACT inhaler, Inhale into the lungs., Disp: , Rfl:    Alum & Mag Hydroxide-Simeth (MYLANTA PO), Take by mouth as needed. , Disp: , Rfl:    aspirin EC 81 MG tablet, Take 81 mg by mouth once a week., Disp: , Rfl:    B Complex Vitamins (VITAMIN-B COMPLEX PO), Take 1 tablet by mouth daily., Disp: , Rfl:    Biotin 20947 MCG TABS, Take by mouth daily., Disp: , Rfl:    Calcium Carbonate (CALTRATE 600 PO), Take 1 tablet by mouth. Bi weekly, Disp: , Rfl:    Cholecalciferol (VITAMIN D) 50 MCG (2000 UT) CAPS, Take by mouth daily. , Disp: , Rfl:    Docosahexaenoic Acid (DHA PO), Take 1 tablet by mouth once a week. , Disp: , Rfl:     ELDERBERRY PO, Take 1 Dose by mouth as needed., Disp: , Rfl:    fluticasone-salmeterol (ADVAIR HFA) 115-21 MCG/ACT inhaler, Inhale 2 puffs into the lungs 2 (two) times daily. As needed, Disp: , Rfl:    folic acid (FOLVITE) 800 MCG tablet, Take 400 mcg by mouth once a week. , Disp: , Rfl:    levOCARNitine-E-Coenzyme Q10 (CO Q-10 PLUS L-CARNITINE PO), Take 1 tablet by mouth daily., Disp: , Rfl:    montelukast (SINGULAIR) 10 MG tablet, Take 1 tablet (10 mg total) by mouth at bedtime., Disp: 30 tablet, Rfl: 2   Multiple Vitamin (MULTIVITAMIN ADULT PO), Take by mouth., Disp: , Rfl:    Multiple Vitamins-Minerals (EMERGEN-C IMMUNE PO), Take 1 tablet by mouth as needed., Disp: , Rfl:    Omega-3 Fatty Acids (EPA PO), Take 1 tablet by mouth 3 (three) times a week. , Disp: , Rfl:   Allergies  Allergen Reactions   Ivp Dye [Iodinated Diagnostic Agents] Other (See Comments)    Respiratory distress   Codeine Nausea Only   Niaspan [Niacin Er] Rash and Other (See Comments)    Nightmares   Past Medical History:  Diagnosis Date   Allergy    seasonal   Arthritis    Asthma    Duodenal ulcer    Gallstones    GERD (gastroesophageal reflux disease)    HLD (hyperlipidemia)    IBS (irritable  bowel syndrome)    Kidney stones    Lumbosacral disc disease    L4-5, L5-S1   Osteopenia     Observations/Objective: A&O  No respiratory distress or wheezing audible over the phone Mood, judgement, and thought processes all WNL  Assessment and Plan: 1. Acute non-recurrent sinusitis, unspecified location - amoxicillin-clavulanate (AUGMENTIN) 875-125 MG tablet; Take 1 tablet by mouth 2 (two) times daily for 7 days.  Dispense: 14 tablet; Refill: 0 - predniSONE (STERAPRED UNI-PAK 21 TAB) 10 MG (21) TBPK tablet; As directed x 6 days  Dispense: 21 tablet; Refill: 0  2. Sore throat - Novel Coronavirus, NAA ([facility]); Future - Veritor Flu A/B Waived; Future   Follow Up Instructions:  I discussed the assessment  and treatment plan with the patient. The patient was provided an opportunity to ask questions and all were answered. The patient agreed with the plan and demonstrated an understanding of the instructions.   The patient was advised to call back or seek an in-person evaluation if the symptoms worsen or if the condition fails to improve as anticipated.  The above assessment and management plan was discussed with the patient. The patient verbalized understanding of and has agreed to the management plan. Patient is aware to call the clinic if symptoms persist or worsen. Patient is aware when to return to the clinic for a follow-up visit. Patient educated on when it is appropriate to go to the emergency department.   Time call ended: 11:23 AM  I provided 11 minutes of non-face-to-face time during this encounter.  [name], MSN, APRN, FNP-C [facility] 11/06/20

## 2020-11-07 ENCOUNTER — Telehealth: Payer: Self-pay | Admitting: Family Medicine

## 2020-11-07 LAB — NOVEL CORONAVIRUS, NAA: SARS-CoV-2, NAA: NOT DETECTED

## 2020-11-07 LAB — SARS-COV-2, NAA 2 DAY TAT

## 2020-11-07 NOTE — Telephone Encounter (Signed)
Pt aware.

## 2020-11-11 ENCOUNTER — Encounter: Payer: Self-pay | Admitting: Family Medicine

## 2020-12-04 ENCOUNTER — Encounter: Payer: Self-pay | Admitting: Family Medicine

## 2020-12-04 ENCOUNTER — Ambulatory Visit (INDEPENDENT_AMBULATORY_CARE_PROVIDER_SITE_OTHER): Payer: Medicare Other | Admitting: Family Medicine

## 2020-12-04 ENCOUNTER — Other Ambulatory Visit: Payer: Self-pay

## 2020-12-04 VITALS — BP 127/69 | HR 61 | Temp 96.9°F | Ht 61.0 in | Wt 116.0 lb

## 2020-12-04 DIAGNOSIS — G479 Sleep disorder, unspecified: Secondary | ICD-10-CM

## 2020-12-04 DIAGNOSIS — R296 Repeated falls: Secondary | ICD-10-CM

## 2020-12-04 DIAGNOSIS — R413 Other amnesia: Secondary | ICD-10-CM | POA: Diagnosis not present

## 2020-12-04 DIAGNOSIS — Z1211 Encounter for screening for malignant neoplasm of colon: Secondary | ICD-10-CM

## 2020-12-04 DIAGNOSIS — E782 Mixed hyperlipidemia: Secondary | ICD-10-CM

## 2020-12-04 DIAGNOSIS — R519 Headache, unspecified: Secondary | ICD-10-CM

## 2020-12-04 DIAGNOSIS — L821 Other seborrheic keratosis: Secondary | ICD-10-CM

## 2020-12-04 NOTE — Progress Notes (Signed)
Assessment & Plan:  1. Memory loss MMSE - Mini Mental State Exam 12/04/2020  Orientation to time 4  Orientation to Place 5  Registration 3  Attention/ Calculation 5  Recall 2  Language- name 2 objects 2  Language- repeat 1  Language- follow 3 step command 3  Language- read & follow direction 1  Write a sentence 1  Copy design 1  Total score 28   - CBC with Differential/Platelet - Vitamin B12 - CMP14+EGFR - TSH - RPR - Ambulatory referral to Neurology  2. Daily headache Does not want medication for headaches, just wants to identify cause. Education provided on headaches. - CBC with Differential/Platelet - Vitamin B12 - Ambulatory referral to Neurology  3. Multiple falls - Ambulatory referral to Neurology  4. Difficulty sleeping Does not want to try medications to help with sleep, just wants to know if she has sleep apnea. - Ambulatory referral to Neurology  5. Seborrheic keratosis Reassurance provided.  6. Mixed hyperlipidemia - CMP14+EGFR - Lipid panel  7. Colon cancer screening - Ambulatory referral to Gastroenterology   Follow up plan: Return in about 3 months (around 03/06/2021) for follow-up of chronic medication conditions (to be treated as CPE, schedule in 30 minute spot).   [name], MSN, APRN, FNP-C [name]  Subjective:   Patient ID: Denise Benton, female    DOB: [birth year], 70 y.o.   MRN: [id number]  HPI: Denise Benton is a 70 y.o. female presenting on 12/04/2020 for Headache and Memory Loss  Patient has a spot on the back of her head that her hair dresser encouraged her to have looked at, since patient cannot see it herself.   Patient is concerned about her memory issues, which she reports have been increasingly concerning over the past year. She gives the example of forgetting peoples names.  She also reports having a headache every morning upon waking for the past couple of months. First thing in the  morning her blood pressure is elevated with her systolic reading [id number], which decreases throughout the day to 90s/60s (she is not on blood pressure medication). She does not sleep well and often wakes up taking a sudden gasp for air. She does not know if she snores. She had COVID in the spring. She does not take anything for the headaches since they go away on their own as the day goes on. She has tried increasing her water intake.   She also mentions she has fallen twice recently for no apparent reason. She had to quit playing pickleball due to her balance issues.    ROS: Negative unless specifically indicated above in HPI.   Relevant past medical history reviewed and updated as indicated.   Allergies and medications reviewed and updated.   Current Outpatient Medications:    acetaminophen (TYLENOL) 325 MG tablet, Take 325 mg by mouth as needed., Disp: , Rfl:    Alum & Mag Hydroxide-Simeth (MYLANTA PO), Take by mouth as needed. , Disp: , Rfl:    B Complex Vitamins (VITAMIN-B COMPLEX PO), Take 1 tablet by mouth daily., Disp: , Rfl:    Biotin 10000 MCG TABS, Take by mouth daily., Disp: , Rfl:    Calcium Carbonate (CALTRATE 600 PO), Take 1 tablet by mouth. Bi weekly, Disp: , Rfl:    Cholecalciferol (VITAMIN D) 50 MCG (2000 UT) CAPS, Take by mouth daily. , Disp: , Rfl:    Docosahexaenoic Acid (DHA PO), Take 1 tablet by mouth once a week. ,  Disp: , Rfl:    ELDERBERRY PO, Take 1 Dose by mouth as needed., Disp: , Rfl:    fluticasone-salmeterol (ADVAIR HFA) 115-21 MCG/ACT inhaler, Inhale 2 puffs into the lungs 2 (two) times daily. As needed, Disp: , Rfl:    folic acid (FOLVITE) 426 MCG tablet, Take 400 mcg by mouth once a week. , Disp: , Rfl:    levOCARNitine-E-Coenzyme Q10 (CO Q-10 PLUS L-CARNITINE PO), Take 1 tablet by mouth daily., Disp: , Rfl:    Multiple Vitamin (MULTIVITAMIN ADULT PO), Take by mouth., Disp: , Rfl:    Multiple Vitamins-Minerals (EMERGEN-C IMMUNE PO), Take 1 tablet by mouth  as needed., Disp: , Rfl:    Omega-3 Fatty Acids (EPA PO), Take 1 tablet by mouth 3 (three) times a week. , Disp: , Rfl:    predniSONE (STERAPRED UNI-PAK 21 TAB) 10 MG (21) TBPK tablet, As directed x 6 days, Disp: 21 tablet, Rfl: 0   albuterol (VENTOLIN HFA) 108 (90 Base) MCG/ACT inhaler, Inhale into the lungs., Disp: , Rfl:   Allergies  Allergen Reactions   Ivp Dye [Iodinated Diagnostic Agents] Other (See Comments)    Respiratory distress   Codeine Nausea Only   Niaspan [Niacin Er] Rash and Other (See Comments)    Nightmares    Objective:   BP 127/69   Pulse 61   Temp (!) 96.9 F (36.1 C) (Temporal)   Ht 5' 1"  (1.549 m)   Wt 116 lb (52.6 kg)   SpO2 99%   BMI 21.92 kg/m    Physical Exam Vitals reviewed.  Constitutional:      General: She is not in acute distress.    Appearance: Normal appearance. She is not ill-appearing, toxic-appearing or diaphoretic.  HENT:     Head: Normocephalic and atraumatic.  Eyes:     General: No scleral icterus.       Right eye: No discharge.        Left eye: No discharge.     Conjunctiva/sclera: Conjunctivae normal.  Cardiovascular:     Rate and Rhythm: Normal rate and regular rhythm.     Heart sounds: Normal heart sounds. No murmur heard.   No friction rub. No gallop.  Pulmonary:     Effort: Pulmonary effort is normal. No respiratory distress.     Breath sounds: Normal breath sounds. No stridor. No wheezing, rhonchi or rales.  Musculoskeletal:        General: Normal range of motion.     Cervical back: Normal range of motion.  Skin:    General: Skin is warm and dry.     Capillary Refill: Capillary refill takes less than 2 seconds.     Comments: Seborrheic keratosis of scalp  Neurological:     General: No focal deficit present.     Mental Status: She is alert and oriented to person, place, and time. Mental status is at baseline.  Psychiatric:        Mood and Affect: Mood normal.        Behavior: Behavior normal.        Thought  Content: Thought content normal.        Judgment: Judgment normal.

## 2020-12-05 LAB — TSH: TSH: 4.57 u[IU]/mL — ABNORMAL HIGH (ref 0.450–4.500)

## 2020-12-05 LAB — CBC WITH DIFFERENTIAL/PLATELET
Basophils Absolute: 0.1 10*3/uL (ref 0.0–0.2)
Basos: 1 %
EOS (ABSOLUTE): 0.1 10*3/uL (ref 0.0–0.4)
Eos: 2 %
Hematocrit: 42.6 % (ref 34.0–46.6)
Hemoglobin: 14.4 g/dL (ref 11.1–15.9)
Immature Grans (Abs): 0 10*3/uL (ref 0.0–0.1)
Immature Granulocytes: 0 %
Lymphocytes Absolute: 1.7 10*3/uL (ref 0.7–3.1)
Lymphs: 34 %
MCH: 31.6 pg (ref 26.6–33.0)
MCHC: 33.8 g/dL (ref 31.5–35.7)
MCV: 94 fL (ref 79–97)
Monocytes Absolute: 0.6 10*3/uL (ref 0.1–0.9)
Monocytes: 11 %
Neutrophils Absolute: 2.5 10*3/uL (ref 1.4–7.0)
Neutrophils: 52 %
Platelets: 328 10*3/uL (ref 150–450)
RBC: 4.55 x10E6/uL (ref 3.77–5.28)
RDW: 12.1 % (ref 11.7–15.4)
WBC: 4.9 10*3/uL (ref 3.4–10.8)

## 2020-12-05 LAB — CMP14+EGFR
ALT: 18 IU/L (ref 0–32)
AST: 27 IU/L (ref 0–40)
Albumin/Globulin Ratio: 2.2 (ref 1.2–2.2)
Albumin: 4.4 g/dL (ref 3.8–4.8)
Alkaline Phosphatase: 90 IU/L (ref 44–121)
BUN/Creatinine Ratio: 19 (ref 12–28)
BUN: 16 mg/dL (ref 8–27)
Bilirubin Total: 0.5 mg/dL (ref 0.0–1.2)
CO2: 28 mmol/L (ref 20–29)
Calcium: 9.9 mg/dL (ref 8.7–10.3)
Chloride: 101 mmol/L (ref 96–106)
Creatinine, Ser: 0.85 mg/dL (ref 0.57–1.00)
Globulin, Total: 2 g/dL (ref 1.5–4.5)
Glucose: 76 mg/dL (ref 70–99)
Potassium: 4.8 mmol/L (ref 3.5–5.2)
Sodium: 141 mmol/L (ref 134–144)
Total Protein: 6.4 g/dL (ref 6.0–8.5)
eGFR: 74 mL/min/{1.73_m2} (ref >59)

## 2020-12-05 LAB — LIPID PANEL
Chol/HDL Ratio: 2.7 ratio (ref 0.0–4.4)
Cholesterol, Total: 210 mg/dL — ABNORMAL HIGH (ref 100–199)
HDL: 78 mg/dL (ref >39)
LDL Chol Calc (NIH): 113 mg/dL — ABNORMAL HIGH (ref 0–99)
Triglycerides: 112 mg/dL (ref 0–149)
VLDL Cholesterol Cal: 19 mg/dL (ref 5–40)

## 2020-12-05 LAB — RPR: RPR Ser Ql: NONREACTIVE

## 2020-12-05 LAB — VITAMIN B12: Vitamin B-12: 726 pg/mL (ref 232–1245)

## 2020-12-24 ENCOUNTER — Telehealth: Payer: Self-pay | Admitting: Family Medicine

## 2020-12-24 NOTE — Telephone Encounter (Signed)
Pt wants a rx for malarona. It is for travel. Pt is going to [place] on Jan 26, 2021. Pt has to start it a few weeks in advance. She needs it printed to take to [facility].  Pt has a scheduled apt tomorrow at 1030 with [facility] and will cancel if joyce is willing to write the rx without apt. She will bring the instructions for the rx tomorrow. She is aware [facility] is off today and will address tomorrow.

## 2020-12-25 ENCOUNTER — Ambulatory Visit (INDEPENDENT_AMBULATORY_CARE_PROVIDER_SITE_OTHER): Payer: Medicare Other

## 2020-12-25 ENCOUNTER — Ambulatory Visit (INDEPENDENT_AMBULATORY_CARE_PROVIDER_SITE_OTHER): Payer: Medicare Other | Admitting: Nurse Practitioner

## 2020-12-25 ENCOUNTER — Encounter: Payer: Self-pay | Admitting: Nurse Practitioner

## 2020-12-25 VITALS — BP 120/69 | HR 61 | Temp 96.3°F | Ht 61.0 in | Wt 118.2 lb

## 2020-12-25 DIAGNOSIS — Z298 Encounter for other specified prophylactic measures: Secondary | ICD-10-CM

## 2020-12-25 DIAGNOSIS — Z23 Encounter for immunization: Secondary | ICD-10-CM | POA: Diagnosis not present

## 2020-12-25 DIAGNOSIS — J4530 Mild persistent asthma, uncomplicated: Secondary | ICD-10-CM | POA: Diagnosis not present

## 2020-12-25 DIAGNOSIS — Z2989 Encounter for other specified prophylactic measures: Secondary | ICD-10-CM

## 2020-12-25 MED ORDER — MONTELUKAST SODIUM 10 MG PO TABS: 10.0000 mg | ORAL_TABLET | Freq: Every day | ORAL | 2 refills | Status: DC

## 2020-12-25 NOTE — Progress Notes (Signed)
Acute Office Visit  Subjective:    Patient ID: Denise Benton, female    DOB: 1950-12-24, 70 y.o.   MRN: [id number]  Chief Complaint  Patient presents with   Advice Only    Rx for malaria, traveling to [place] on 01/26/21    HPI Patient is in clinic today for malaria prophylaxis.  Patient is traveling to [place] in the next few weeks.  No current symptoms.  Past Medical History:  Diagnosis Date   Allergy    seasonal   Arthritis    Asthma    Duodenal ulcer    Gallstones    GERD (gastroesophageal reflux disease)    HLD (hyperlipidemia)    IBS (irritable bowel syndrome)    Kidney stones    Lumbosacral disc disease    L4-5, L5-S1   Osteopenia     Past Surgical History:  Procedure Laterality Date   BREAST CYST ASPIRATION Right    pt has cysts aspiration done for lumps   CESAREAN SECTION  1980   x2; again in [place]  07/2011   from scalp - keratosis    Family History  Problem Relation Age of Onset   Tremor Mother    Hyperlipidemia Mother    Skin cancer Mother    Dementia Mother    Congestive Heart Failure Father    Diabetes Father    Heart disease Father        congestive heart failure   Multiple sclerosis Sister    Heart disease Sister    Kidney Stones Sister    Hypertension Daughter    Anxiety disorder Daughter    Diabetes Paternal Grandfather    Tremor Sister    Colon cancer Neg Hx    Esophageal cancer Neg Hx    Stomach cancer Neg Hx    Rectal cancer Neg Hx    Inflammatory bowel disease Neg Hx    Liver disease Neg Hx    Pancreatic cancer Neg Hx     Social History   Socioeconomic History   Marital status: Married    Spouse name: Jeneen Rinks    Number of children: 2   Years of education: 4 years college   Highest education level: Bachelor's degree (e.g., BA, AB, BS)  Occupational History   Occupation: Retired RN/NP   Occupation: Works part time at [employer] Use   Smoking status:  Former    Packs/day: 0.50    Years: 1.00    Pack years: 0.50    Types: Cigarettes    Quit date: 01/13/1969    Years since quitting: 51.9   Smokeless tobacco: Never  Vaping Use   Vaping Use: Never used  Substance and Sexual Activity   Alcohol use: No   Drug use: No   Sexual activity: Not Currently  Other Topics Concern   Not on file  Social History Narrative   Lives home with husband - daughter in [place] here from [place] in 2020   Social Determinants of Health   Financial Resource Strain: Low Risk    Difficulty of Paying Living Expenses: Not hard at all  Food Insecurity: No Food Insecurity   Worried About [name] fundraiser in the Last Year: Never true   Arboriculturist in the Last Year: Never true  Transportation Needs: No Transportation Needs   Lack of Transportation (Medical): No   Lack of Transportation (Non-Medical): No  Physical Activity: Sufficiently Active   Days of Exercise per Week: 7 days   Minutes of Exercise per Session: 30 min  Stress: No Stress Concern Present   Feeling of Stress : Not at all  Social Connections: Moderately Integrated   Frequency of Communication with Friends and Family: More than three times a week   Frequency of Social Gatherings with Friends and Family: More than three times a week   Attends Religious Services: 1 to 4 times per year   Active Member of [employer] or Organizations: No   Attends Archivist Meetings: Never   Marital Status: Married  Human resources officer Violence: Not At Risk   Fear of Current or Ex-Partner: No   Emotionally Abused: No   Physically Abused: No   Sexually Abused: No    Outpatient Medications Prior to Visit  Medication Sig Dispense Refill   acetaminophen (TYLENOL) 325 MG tablet Take 325 mg by mouth as needed.     Alum & Mag Hydroxide-Simeth (MYLANTA PO) Take by mouth as needed.      B Complex Vitamins (VITAMIN-B COMPLEX PO) Take 1 tablet by mouth daily.     Biotin 10000 MCG TABS Take  by mouth daily.     Calcium Carbonate (CALTRATE 600 PO) Take 1 tablet by mouth. Bi weekly     chlorhexidine (PERIDEX) 0.12 % solution SMARTSIG:15 By Mouth Morning-Evening     Cholecalciferol (VITAMIN D) 50 MCG (2000 UT) CAPS Take by mouth daily.      Docosahexaenoic Acid (DHA PO) Take 1 tablet by mouth once a week.      ELDERBERRY PO Take 1 Dose by mouth as needed.     fluticasone-salmeterol (ADVAIR HFA) 115-21 MCG/ACT inhaler Inhale 2 puffs into the lungs 2 (two) times daily. As needed     folic acid (FOLVITE) 177 MCG tablet Take 400 mcg by mouth once a week.      levOCARNitine-E-Coenzyme Q10 (CO Q-10 PLUS L-CARNITINE PO) Take 1 tablet by mouth daily.     Multiple Vitamin (MULTIVITAMIN ADULT PO) Take by mouth.     Multiple Vitamins-Minerals (EMERGEN-C IMMUNE PO) Take 1 tablet by mouth as needed.     Omega-3 Fatty Acids (EPA PO) Take 1 tablet by mouth 3 (three) times a week.      albuterol (VENTOLIN HFA) 108 (90 Base) MCG/ACT inhaler Inhale into the lungs.     predniSONE (STERAPRED UNI-PAK 21 TAB) 10 MG (21) TBPK tablet As directed x 6 days 21 tablet 0   No facility-administered medications prior to visit.    Allergies  Allergen Reactions   Ivp Dye [Iodinated Diagnostic Agents] Other (See Comments)    Respiratory distress   Codeine Nausea Only   Niaspan [Niacin Er] Rash and Other (See Comments)    Nightmares    Review of Systems  Constitutional: Negative.   HENT: Negative.    Respiratory: Negative.    Cardiovascular: Negative.   Gastrointestinal: Negative.   Genitourinary: Negative.   Musculoskeletal: Negative.   Skin: Negative.   All other systems reviewed and are negative.     Objective:    Physical Exam Vitals and nursing note reviewed.  Constitutional:      Appearance: Normal appearance.  HENT:     Head: Normocephalic.     Right Ear: External ear normal.     Left Ear: External ear normal.     Nose: Nose normal.     Mouth/Throat:     Mouth: Mucous membranes are  moist.  Pharynx: Oropharynx is clear.  Eyes:     Conjunctiva/sclera: Conjunctivae normal.  Cardiovascular:     Rate and Rhythm: Normal rate and regular rhythm.     Pulses: Normal pulses.     Heart sounds: Normal heart sounds.  Pulmonary:     Effort: Pulmonary effort is normal.     Breath sounds: Normal breath sounds.  Abdominal:     General: Bowel sounds are normal.  Musculoskeletal:        General: Normal range of motion.  Skin:    General: Skin is warm.     Findings: No rash.  Neurological:     Mental Status: She is alert and oriented to person, place, and time.  Psychiatric:        Mood and Affect: Mood normal.        Behavior: Behavior normal.    BP 120/69    Pulse 61    Temp (!) 96.3 F (35.7 C) (Temporal)    Ht _0  (1.549 m)    Wt 118 lb 3.2 oz (53.6 kg)    SpO2 99%    BMI 22.33 kg/m  Wt Readings from Last 3 Encounters:  12/25/20 118 lb 3.2 oz (53.6 kg)  12/04/20 116 lb (52.6 kg)  06/12/20 116 lb 12.8 oz (53 kg)    Health Maintenance Due  Topic Date Due   COVID-19 Vaccine (2 - Pfizer risk series) 10/14/2019   COLONOSCOPY (Pts 45-23yr Insurance coverage will need to be confirmed)  10/24/2020    There are no preventive care reminders to display for this patient.   Lab Results  Component Value Date   TSH 4.570 (H) 12/04/2020   Lab Results  Component Value Date   WBC 4.9 12/04/2020   HGB 14.4 12/04/2020   HCT 42.6 12/04/2020   MCV 94 12/04/2020   PLT 328 12/04/2020   Lab Results  Component Value Date   NA 141 12/04/2020   K 4.8 12/04/2020   CO2 28 12/04/2020   GLUCOSE 76 12/04/2020   BUN 16 12/04/2020   CREATININE 0.85 12/04/2020   BILITOT 0.5 12/04/2020   ALKPHOS 90 12/04/2020   AST 27 12/04/2020   ALT 18 12/04/2020   PROT 6.4 12/04/2020   ALBUMIN 4.4 12/04/2020   CALCIUM 9.9 12/04/2020   EGFR 74 12/04/2020   GFR 69.19 03/22/2019   Lab Results  Component Value Date   CHOL 210 (H) 12/04/2020   Lab Results  Component Value Date    HDL 78 12/04/2020   Lab Results  Component Value Date   LDLCALC 113 (H) 12/04/2020   Lab Results  Component Value Date   TRIG 112 12/04/2020   Lab Results  Component Value Date   CHOLHDL 2.7 12/04/2020   No results found for: HGBA1C     Assessment & Plan:   Problem List Items Addressed This Visit       Respiratory   Asthma    Asthma well-controlled on current medication.  No changes necessary.  Refill sent to pharmacy.      Relevant Medications   montelukast (SINGULAIR) 10 MG tablet     Other   Need for malaria prophylaxis - Primary    Patient is traveling out of the country to [place] a few weeks and requesting for malaria prophylaxis.  Completed Rx medication sent to pharmacy.  For Malarone take 1 tablet by mouth daily starting 2 days before travel, continue for 7 days after you return home.  Typhimvi 0.5 mL  intramuscular once for typhoid.  Follow-up with further questions.        Meds ordered this encounter  Medications   montelukast (SINGULAIR) 10 MG tablet    Sig: Take 1 tablet (10 mg total) by mouth at bedtime.    Dispense:  30 tablet    Refill:  2    Adding back onto current med list, no print     Ivy Lynn, NP

## 2020-12-25 NOTE — Assessment & Plan Note (Signed)
Asthma well-controlled on current medication.  No changes necessary.  Refill sent to pharmacy.

## 2020-12-25 NOTE — Addendum Note (Signed)
Addended by: Quay Burow on: 12/25/2020 11:01 AM   Modules accepted: Orders

## 2020-12-25 NOTE — Telephone Encounter (Signed)
Patient is currently at appointment with [facility]

## 2020-12-25 NOTE — Assessment & Plan Note (Signed)
Patient is traveling out of the country to [place] in a few weeks and requesting for malaria prophylaxis.  Completed Rx medication sent to pharmacy.  For Malarone take 1 tablet by mouth daily starting 2 days before travel, continue for 7 days after you return home.  Typhimvi 0.5 mL intramuscular once for typhoid.  Follow-up with further questions.

## 2020-12-25 NOTE — Telephone Encounter (Signed)
Patient seen JE- please advise  

## 2020-12-25 NOTE — Telephone Encounter (Signed)
Pt called stating that she will be at our office shortly to pick up Rx for Malaria and wants to know if its possible to get Rx for Typhoid too if necessary.

## 2020-12-25 NOTE — Telephone Encounter (Signed)
Needs to keep appointment to discuss prescription.

## 2020-12-25 NOTE — Telephone Encounter (Signed)
Patient came to triage this morning for flu and Tdap vaccine.  She told me she has appointment scheduled today to discuss vaccinations for her upcoming mission trip.

## 2020-12-31 ENCOUNTER — Other Ambulatory Visit: Payer: Self-pay

## 2020-12-31 ENCOUNTER — Encounter: Payer: Self-pay | Admitting: Psychiatry

## 2020-12-31 ENCOUNTER — Ambulatory Visit (INDEPENDENT_AMBULATORY_CARE_PROVIDER_SITE_OTHER): Payer: Medicare Other | Admitting: Psychiatry

## 2020-12-31 VITALS — BP 120/70 | HR 63 | Ht 61.0 in | Wt 117.2 lb

## 2020-12-31 DIAGNOSIS — R400 Somnolence: Secondary | ICD-10-CM

## 2020-12-31 DIAGNOSIS — R413 Other amnesia: Secondary | ICD-10-CM

## 2020-12-31 NOTE — Progress Notes (Signed)
[facility]  PATIENT: Denise Benton DOB: 1950/10/19  REFERRING CLINICIAN: Gwenlyn Fudge, FNP HISTORY FROM: self REASON FOR VISIT: headaches, memory issues, falls   HISTORICAL  CHIEF COMPLAINT:  Chief Complaint  Patient presents with   New Patient (Initial Visit)    Rm 8. Alone. Pt states she has headaches in the morning, upon waking. Pt c/o memory issues. MMSE (28/30) was done in NP Britney Joyce's office on 12/04/20. She has had three falls within the past few months.    HISTORY OF PRESENT ILLNESS:  The patient presents for headaches and memory issues following the COVID vaccine in summer 2022. 12 hours after the vaccine she woke up with flashing lights in her eyes, nausea, and numbness in the right arm. Couldn't move either leg. Felt left arm (one with vaccine) was burning. Fell back asleep and when she woke up most symptoms had resolved.  Since then she has had persistent memory loss/brain fog and headaches. Had her eyes checked and was told she had a mild cataract but nothing else. Brain fog comes and goes. Will intermittently forget where she puts objects or forget peoples names, then will be fine the next day. Has headaches on and off which do sometimes seem associated with the brain fog. Currently has headaches 1-2 times per week. Some are mild and resolve on their own, others require tylenol. Headaches are dull aching which are occipital and radiate forward. They are associated with photophobia, no phonophobia or nausea. Headaches last 2 hours at a time.  Had COVID in November 2019 and did have brain fog at that time.  Notes that she will wake up gasping for air at night. Feels very fatigued during the day.  OTHER MEDICAL CONDITIONS: asthma   REVIEW OF SYSTEMS: Full 14 system review of systems performed and negative with exception of: headaches, brain fog  ALLERGIES: Allergies  Allergen Reactions   Ivp Dye [Iodinated Diagnostic Agents] Other  (See Comments)    Respiratory distress   Covid-19 (Mrna) Vaccine     Flashing lights in visit, headaches, arm numbness, inability to move   Codeine Nausea Only   Niaspan [Niacin Er] Rash and Other (See Comments)    Nightmares    HOME MEDICATIONS: Outpatient Medications Prior to Visit  Medication Sig Dispense Refill   acetaminophen (TYLENOL) 325 MG tablet Take 325 mg by mouth as needed.     Alum & Mag Hydroxide-Simeth (MYLANTA PO) Take by mouth as needed.      B Complex Vitamins (VITAMIN-B COMPLEX PO) Take 1 tablet by mouth daily.     Biotin 16606 MCG TABS Take by mouth daily.     Calcium Carbonate (CALTRATE 600 PO) Take 1 tablet by mouth. Bi weekly     chlorhexidine (PERIDEX) 0.12 % solution SMARTSIG:15 By Mouth Morning-Evening     Cholecalciferol (VITAMIN D) 50 MCG (2000 UT) CAPS Take by mouth daily.      Docosahexaenoic Acid (DHA PO) Take 1 tablet by mouth once a week.      ELDERBERRY PO Take 1 Dose by mouth as needed.     fluticasone-salmeterol (ADVAIR HFA) 115-21 MCG/ACT inhaler Inhale 2 puffs into the lungs 2 (two) times daily. As needed     folic acid (FOLVITE) 800 MCG tablet Take 400 mcg by mouth once a week.      levOCARNitine-E-Coenzyme Q10 (CO Q-10 PLUS L-CARNITINE PO) Take 1 tablet by mouth daily.     montelukast (SINGULAIR) 10 MG tablet Take 1 tablet (  10 mg total) by mouth at bedtime. 30 tablet 2   Multiple Vitamins-Minerals (EMERGEN-C IMMUNE PO) Take 1 tablet by mouth as needed.     Omega-3 Fatty Acids (EPA PO) Take 1 tablet by mouth 3 (three) times a week.      albuterol (VENTOLIN HFA) 108 (90 Base) MCG/ACT inhaler Inhale into the lungs.     Multiple Vitamin (MULTIVITAMIN ADULT PO) Take by mouth.     No facility-administered medications prior to visit.    PAST MEDICAL HISTORY: Past Medical History:  Diagnosis Date   Allergy    seasonal   Arthritis    Asthma    Duodenal ulcer    Gallstones    GERD (gastroesophageal reflux disease)    HLD (hyperlipidemia)     IBS (irritable bowel syndrome)    Kidney stones    Lumbosacral disc disease    L4-5, L5-S1   Osteopenia     PAST SURGICAL HISTORY: Past Surgical History:  Procedure Laterality Date   BREAST CYST ASPIRATION Right    pt has cysts aspiration done for lumps   CESAREAN SECTION  1980   x2; again in 1984   LAPAROSCOPIC CHOLECYSTECTOMY  2000   SKIN LESION EXCISION  07/2011   from scalp - keratosis    FAMILY HISTORY: Family History  Problem Relation Age of Onset   Tremor Mother    Hyperlipidemia Mother    Skin cancer Mother    Dementia Mother    Congestive Heart Failure Father    Diabetes Father    Heart disease Father        congestive heart failure   Multiple sclerosis Sister    Heart disease Sister    Kidney Stones Sister    Hypertension Daughter    Anxiety disorder Daughter    Diabetes Paternal Grandfather    Tremor Sister    Colon cancer Neg Hx    Esophageal cancer Neg Hx    Stomach cancer Neg Hx    Rectal cancer Neg Hx    Inflammatory bowel disease Neg Hx    Liver disease Neg Hx    Pancreatic cancer Neg Hx     SOCIAL HISTORY: Social History   Socioeconomic History   Marital status: Married    Spouse name: Fayrene Fearing    Number of children: 2   Years of education: 4 years college   Highest education level: Bachelor's degree (e.g., BA, AB, BS)  Occupational History   Occupation: Retired RN/NP   Occupation: Works part time at [employer]  Tobacco Use   Smoking status: Former    Packs/day: 0.50    Years: 1.00    Pack years: 0.50    Types: Cigarettes    Quit date: 01/13/1969    Years since quitting: 52.0   Smokeless tobacco: Never  Vaping Use   Vaping Use: Never used  Substance and Sexual Activity   Alcohol use: No   Drug use: No   Sexual activity: Not Currently  Other Topics Concern   Not on file  Social History Narrative   Lives home with husband - daughter in [place] here from [place] in 2020   Social Determinants of Health   Financial  Resource Strain: Low Risk    Difficulty of Paying Living Expenses: Not hard at all  Food Insecurity: No Food Insecurity   Worried About Programme researcher, broadcasting/film/video in the Last Year: Never true   [employer] of Food in the Last Year: Never true  Transportation Needs: No Regulatory affairs officer (Medical): No   Lack of Transportation (Non-Medical): No  Physical Activity: Sufficiently Active   Days of Exercise per Week: 7 days   Minutes of Exercise per Session: 30 min  Stress: No Stress Concern Present   Feeling of Stress : Not at all  Social Connections: Moderately Integrated   Frequency of Communication with Friends and Family: More than three times a week   Frequency of Social Gatherings with Friends and Family: More than three times a week   Attends Religious Services: 1 to 4 times per year   Active Member of [employer] or Organizations: No   Attends Banker Meetings: Never   Marital Status: Married  Catering manager Violence: Not At Risk   Fear of Current or Ex-Partner: No   Emotionally Abused: No   Physically Abused: No   Sexually Abused: No     PHYSICAL EXAM   GENERAL EXAM/CONSTITUTIONAL: Vitals:  Vitals:   12/31/20 1413  BP: 120/70  Pulse: 63  Weight: 117 lb 3.2 oz (53.2 kg)  Height:  (1.549 m)   Body mass index is 22.14 kg/m. Wt Readings from Last 3 Encounters:  12/31/20 117 lb 3.2 oz (53.2 kg)  12/25/20 118 lb 3.2 oz (53.6 kg)  12/04/20 116 lb (52.6 kg)   Patient is in no distress; well developed, nourished and groomed; neck is supple  CARDIOVASCULAR: Examination of peripheral vascular system by observation and palpation is normal  EYES: Pupils round and reactive to light, Visual fields full to confrontation, Extraocular movements intacts,   MUSCULOSKELETAL: Gait, strength, tone, movements noted in Neurologic exam below  NEUROLOGIC: MENTAL STATUS:  MMSE - Mini Mental State Exam 12/04/2020  Orientation to time 4  Orientation to  Place 5  Registration 3  Attention/ Calculation 5  Recall 2  Language- name 2 objects 2  Language- repeat 1  Language- follow 3 step command 3  Language- read & follow direction 1  Write a sentence 1  Copy design 1  Total score 28   awake, alert, oriented to person, place and time recent and remote memory intact normal attention and concentration language fluent, comprehension intact, naming intact fund of knowledge appropriate  CRANIAL NERVE:  2nd, 3rd, 4th, 6th - pupils equal and reactive to light, visual fields full to confrontation, extraocular muscles intact, no nystagmus 5th - facial sensation symmetric 7th - facial strength symmetric 8th - hearing intact 9th - palate elevates symmetrically, uvula midline 11th - shoulder shrug symmetric 12th - tongue protrusion midline  MOTOR:  normal bulk and tone, full strength in the BUE, BLE  SENSORY:  normal and symmetric to light touch all 4 extremities  COORDINATION:  finger-nose-finger, fine finger movements normal  REFLEXES:  deep tendon reflexes present and symmetric  GAIT/STATION:  normal     DIAGNOSTIC DATA (LABS, IMAGING, TESTING) - I reviewed patient records, labs, notes, testing and imaging myself where available.  Lab Results  Component Value Date   WBC 4.9 12/04/2020   HGB 14.4 12/04/2020   HCT 42.6 12/04/2020   MCV 94 12/04/2020   PLT 328 12/04/2020      Component Value Date/Time   NA 141 12/04/2020 1147   K 4.8 12/04/2020 1147   CL 101 12/04/2020 1147   CO2 28 12/04/2020 1147   GLUCOSE 76 12/04/2020 1147   GLUCOSE 82 03/22/2019 1044   BUN 16 12/04/2020 1147   CREATININE 0.85 12/04/2020 1147   CALCIUM 9.9  12/04/2020 1147   PROT 6.4 12/04/2020 1147   ALBUMIN 4.4 12/04/2020 1147   AST 27 12/04/2020 1147   ALT 18 12/04/2020 1147   ALKPHOS 90 12/04/2020 1147   BILITOT 0.5 12/04/2020 1147   GFRNONAA 61 11/09/2019 0823   GFRAA 71 11/09/2019 0823   Lab Results  Component Value Date   CHOL  210 (H) 12/04/2020   HDL 78 12/04/2020   LDLCALC 113 (H) 12/04/2020   TRIG 112 12/04/2020   CHOLHDL 2.7 12/04/2020   No results found for: HGBA1C Lab Results  Component Value Date   VITAMINB12 726 12/04/2020   Lab Results  Component Value Date   TSH 4.570 (H) 12/04/2020     ASSESSMENT AND PLAN  70 y.o. year old female with a history of asthma who presents for evaluation of memory loss and headaches following COVID vaccine in June 2022. Recent MMSE was within normal limits, however she remains very concerned about memory issue as she feels it is significantly affecting her life. Will order MRI brain to assess for structural causes of memory loss  including neurodegeneration and significant vascular disease. She is concerned about possible sleep apnea due to waking up gasping for air at night and excessive daytime fatigue. Referral for Sleep evaluation placed. Discussed how issues with sleep and headaches can contribute to brain fog. She is not interested in starting a headache medication at this time. Provided information on supplements to help prevent headaches. Offered referral to cognitive rehab, but she prefers to hold off at this time.   1. Memory loss   2. Daytime sleepiness       PLAN: -MRI brain -Referral to Sleep medicine for OSA evaluation -Supplement information for headache prevention provided -Next steps: consider neuropsych testing, cognitive rehab   Orders Placed This Encounter  Procedures   MR BRAIN W WO CONTRAST   Ambulatory referral to Neurology    No orders of the defined types were placed in this encounter.   Return in about 3 months (around 03/31/2021).    Ocie Doyne, MD 12/31/20 2:57 PM  I spent an average of 34 minutes chart reviewing and counseling the patient, with at least 50% of the time face to face with the patient.   [facility] [address] [place], [place] [address] [phone]

## 2020-12-31 NOTE — Patient Instructions (Signed)
MRI brain Referral to Sleep for sleep apnea evaluation  Natural supplements: Magnesium Oxide or Magnesium Glycinate 500 mg at bed (up to 800 mg daily) Coenzyme Q10 300 mg in AM Vitamin B2- 200 mg twice a day  Add 1 supplement at a time since even natural supplements can have undesirable side effects. You can sometimes buy supplements cheaper (especially Coenzyme Q10) at www.[employer]mailGuide.co.za or at [employer].  Vitamins and herbs that show potential:  Magnesium: Magnesium (250 mg twice a day or 500 mg at bed) has a relaxant effect on smooth muscles such as blood vessels. Individuals suffering from frequent or daily headache usually have low magnesium levels which can be increase with daily supplementation of 400-750 mg. Three trials found 40-90% average headache reduction  when used as a preventative. Magnesium also demonstrated the benefit in menstrually related migraine.  Magnesium is part of the messenger system in the serotonin cascade and it is a good muscle relaxant.  It is also useful for constipation which can be a side effect of other medications used to treat migraine. Good sources include nuts, whole grains, and tomatoes. Side Effects: loose stool/diarrhea Riboflavin (vitamin B 2) 200 mg twice a day. This vitamin assists nerve cells in the production of ATP a principal energy storing molecule.  It is necessary for many chemical reactions in the body.  There have been at least 3 clinical trials of riboflavin using 400 mg per day all of which suggested that migraine frequency can be decreased.  All 3 trials showed significant improvement in over half of migraine sufferers.  The supplement is found in bread, cereal, milk, meat, and poultry.  Most Americans get more riboflavin than the recommended daily allowance, however riboflavin deficiency is not necessary for the supplements to help prevent headache. Side effects: energizing, green urine  Coenzyme Q10: This is present in almost all cells in the body  and is critical component for the conversion of energy.  Recent studies have shown that a nutritional supplement of CoQ10 can reduce the frequency of migraine attacks by improving the energy production of cells as with riboflavin.  Doses of 150 mg twice a day have been shown to be effective.

## 2021-01-01 ENCOUNTER — Telehealth: Payer: Self-pay | Admitting: Psychiatry

## 2021-01-01 NOTE — Telephone Encounter (Signed)
Medicare/tricare order sent to [facility], [facility] they will reach out to the patient to schedule.  °

## 2021-01-08 ENCOUNTER — Telehealth: Payer: Self-pay | Admitting: Family Medicine

## 2021-01-08 ENCOUNTER — Ambulatory Visit: Payer: Medicare Other

## 2021-01-08 NOTE — Telephone Encounter (Signed)
Pt aware and scheduled with [name] 01/17/2021 at 9:50.

## 2021-01-08 NOTE — Telephone Encounter (Signed)
Pt is going to [place] with a medical group for a missionary. Pt states on the list of things to do it says to request Azithromycin 500 mg one a day for 3 days for diarrhea. Please advise.

## 2021-01-08 NOTE — Telephone Encounter (Signed)
Needs an appointment unless Je wants to add this on as she has previously seen patient for prescriptions to prevent malaria.

## 2021-01-12 ENCOUNTER — Other Ambulatory Visit: Payer: Self-pay

## 2021-01-12 ENCOUNTER — Encounter (HOSPITAL_COMMUNITY): Payer: Self-pay | Admitting: Emergency Medicine

## 2021-01-12 ENCOUNTER — Emergency Department (HOSPITAL_COMMUNITY): Payer: Medicare Other

## 2021-01-12 ENCOUNTER — Emergency Department (HOSPITAL_COMMUNITY)
Admission: EM | Admit: 2021-01-12 | Discharge: 2021-01-13 | Disposition: A | Discharge status: Home / Self Care | Payer: Medicare Other | Attending: Emergency Medicine | Admitting: Emergency Medicine

## 2021-01-12 DIAGNOSIS — Z87891 Personal history of nicotine dependence: Secondary | ICD-10-CM | POA: Insufficient documentation

## 2021-01-12 DIAGNOSIS — J45909 Unspecified asthma, uncomplicated: Secondary | ICD-10-CM | POA: Insufficient documentation

## 2021-01-12 DIAGNOSIS — N200 Calculus of kidney: Secondary | ICD-10-CM | POA: Diagnosis not present

## 2021-01-12 DIAGNOSIS — N23 Unspecified renal colic: Secondary | ICD-10-CM | POA: Insufficient documentation

## 2021-01-12 DIAGNOSIS — R109 Unspecified abdominal pain: Secondary | ICD-10-CM | POA: Diagnosis present

## 2021-01-12 LAB — URINALYSIS, ROUTINE W REFLEX MICROSCOPIC
Bacteria, UA: NONE SEEN
Bilirubin Urine: NEGATIVE
Glucose, UA: NEGATIVE mg/dL
Ketones, ur: NEGATIVE mg/dL
Leukocytes,Ua: NEGATIVE
Nitrite: NEGATIVE
Protein, ur: 100 mg/dL — AB
RBC / HPF: >50 RBC/hpf — ABNORMAL HIGH (ref 0–5)
Specific Gravity, Urine: 1.027 (ref 1.005–1.030)
pH: 5 (ref 5.0–8.0)

## 2021-01-12 MED ORDER — ONDANSETRON 4 MG PO TBDP
4.0000 mg | ORAL_TABLET | Freq: Three times a day (TID) | ORAL | 0 refills | Status: DC | PRN
Start: 2021-01-12 — End: 2021-01-17

## 2021-01-12 MED ORDER — HYDROMORPHONE HCL 1 MG/ML IJ SOLN
0.5000 mg | Freq: Once | INTRAMUSCULAR | Status: AC
  Administered 2021-01-12: 0.5 mg via INTRAVENOUS
  Filled 2021-01-12: qty 1

## 2021-01-12 MED ORDER — OXYCODONE-ACETAMINOPHEN 5-325 MG PO TABS: 1.0000 | ORAL_TABLET | ORAL | 0 refills | Status: DC | PRN

## 2021-01-12 MED ORDER — KETOROLAC TROMETHAMINE 30 MG/ML IJ SOLN
30.0000 mg | Freq: Once | INTRAMUSCULAR | Status: AC
  Administered 2021-01-12: 30 mg via INTRAVENOUS
  Filled 2021-01-12: qty 1

## 2021-01-12 MED ORDER — SODIUM CHLORIDE 0.9 % IV BOLUS
250.0000 mL | Freq: Once | INTRAVENOUS | Status: AC
  Administered 2021-01-12: 250 mL via INTRAVENOUS

## 2021-01-12 MED ORDER — SODIUM CHLORIDE 0.9 % IV BOLUS
1000.0000 mL | Freq: Once | INTRAVENOUS | Status: AC
  Administered 2021-01-12: 1000 mL via INTRAVENOUS

## 2021-01-12 MED ORDER — ONDANSETRON HCL 4 MG/2ML IJ SOLN
4.0000 mg | Freq: Once | INTRAMUSCULAR | Status: AC
  Administered 2021-01-12: 4 mg via INTRAVENOUS
  Filled 2021-01-12: qty 2

## 2021-01-12 NOTE — Discharge Instructions (Addendum)
Take Percocet as prescribed as needed for pain.  Follow-up with urology if you have not passed the stone in the next 3 to 4 days.  The contact information for Dr. Retta Diones has been provided in this discharge summary for you to call and make these arrangements.  Return to the emergency department in the meantime if you develop worsening pain, high fever, intractable vomiting, or other new and concerning symptoms.

## 2021-01-12 NOTE — ED Provider Notes (Signed)
[facility] EMERGENCY DEPARTMENT Provider Note   CSN: [id number] Arrival date & time: 01/12/21  1914     History Chief Complaint  Patient presents with   Flank Pain    Denise Benton is a 70 y.o. female.  The history is provided by the patient. No language interpreter was used.  Flank Pain This is a new problem. The current episode started 6 to 12 hours ago. The problem occurs constantly. The problem has been gradually worsening. Associated symptoms include abdominal pain. Nothing aggravates the symptoms. Nothing relieves the symptoms. She has tried nothing for the symptoms. The treatment provided no relief.      Past Medical History:  Diagnosis Date   Allergy    seasonal   Arthritis    Asthma    Duodenal ulcer    Gallstones    GERD (gastroesophageal reflux disease)    HLD (hyperlipidemia)    IBS (irritable bowel syndrome)    Kidney stones    Lumbosacral disc disease    L4-5, L5-S1   Osteopenia     Patient Active Problem List   Diagnosis Date Noted   Need for malaria prophylaxis 12/25/2020   Other dysphagia 05/18/2019   Laryngopharyngeal reflux (LPR) 05/18/2019   Asthma    GERD (gastroesophageal reflux disease)    Osteopenia     Past Surgical History:  Procedure Laterality Date   BREAST CYST ASPIRATION Right    pt has cysts aspiration done for lumps   CESAREAN SECTION  1980   x2; again in 1984   LAPAROSCOPIC CHOLECYSTECTOMY  2000   SKIN LESION EXCISION  07/2011   from scalp - keratosis     OB History   No obstetric history on file.     Family History  Problem Relation Age of Onset   Tremor Mother    Hyperlipidemia Mother    Skin cancer Mother    Dementia Mother    Congestive Heart Failure Father    Diabetes Father    Heart disease Father        congestive heart failure   Multiple sclerosis Sister    Heart disease Sister    Kidney Stones Sister    Hypertension Daughter    Anxiety disorder Daughter    Diabetes Paternal Grandfather     Tremor Sister    Colon cancer Neg Hx    Esophageal cancer Neg Hx    Stomach cancer Neg Hx    Rectal cancer Neg Hx    Inflammatory bowel disease Neg Hx    Liver disease Neg Hx    Pancreatic cancer Neg Hx     Social History   Tobacco Use   Smoking status: Former    Packs/day: 0.50    Years: 1.00    Pack years: 0.50    Types: Cigarettes    Quit date: 01/13/1969    Years since quitting: 52.0   Smokeless tobacco: Never  Vaping Use   Vaping Use: Never used  Substance Use Topics   Alcohol use: No   Drug use: No    Home Medications Prior to Admission medications   Medication Sig Start Date End Date Taking? Authorizing Provider  acetaminophen (TYLENOL) 325 MG tablet Take 325 mg by mouth as needed.    [provider]  albuterol (VENTOLIN HFA) 108 (90 Base) MCG/ACT inhaler Inhale into the lungs. 05/01/18 03/06/20  [provider]  Alum & Mag Hydroxide-Simeth (MYLANTA PO) Take by mouth as needed.     [provider]  B Complex Vitamins (VITAMIN-B COMPLEX PO) Take 1 tablet by mouth daily.    [provider]  Biotin 04540 MCG TABS Take by mouth daily.    [provider]  Calcium Carbonate (CALTRATE 600 PO) Take 1 tablet by mouth. Bi weekly    [provider]  chlorhexidine (PERIDEX) 0.12 % solution SMARTSIG:15 By Mouth Morning-Evening 08/24/20   [provider]  Cholecalciferol (VITAMIN D) 50 MCG (2000 UT) CAPS Take by mouth daily.     [provider]  Docosahexaenoic Acid (DHA PO) Take 1 tablet by mouth once a week.     [provider]  ELDERBERRY PO Take 1 Dose by mouth as needed.    [provider]  fluticasone-salmeterol (ADVAIR HFA) 115-21 MCG/ACT inhaler Inhale 2 puffs into the lungs 2 (two) times daily. As needed    [provider]  folic acid (FOLVITE) 800 MCG tablet Take 400 mcg by mouth once a week.     [provider]  levOCARNitine-E-Coenzyme Q10 (CO Q-10 PLUS L-CARNITINE  PO) Take 1 tablet by mouth daily.    [provider]  montelukast (SINGULAIR) 10 MG tablet Take 1 tablet (10 mg total) by mouth at bedtime. 12/25/20   Daryll Drown, NP  Multiple Vitamins-Minerals (EMERGEN-C IMMUNE PO) Take 1 tablet by mouth as needed.    [provider]  Omega-3 Fatty Acids (EPA PO) Take 1 tablet by mouth 3 (three) times a week.     [provider]    Allergies    Ivp dye [iodinated contrast media], Covid-19 (mrna) vaccine, Codeine, and Niaspan [niacin er]  Review of Systems   Review of Systems  Gastrointestinal:  Positive for abdominal pain.  Genitourinary:  Positive for flank pain.  All other systems reviewed and are negative.  Physical Exam Updated Vital Signs BP 129/69 (BP Location: Right Arm)    Pulse 61    Temp 97.6 F (36.4 C) (Oral)    Resp 20    Ht  (1.549 m)    Wt 50.8 kg    SpO2 100%    BMI 21.16 kg/m   Physical Exam Vitals reviewed.  Constitutional:      Appearance: Normal appearance.  HENT:     Head: Normocephalic.     Mouth/Throat:     Mouth: Mucous membranes are moist.  Eyes:     Extraocular Movements: Extraocular movements intact.     Pupils: Pupils are equal, round, and reactive to light.  Cardiovascular:     Rate and Rhythm: Normal rate and regular rhythm.  Pulmonary:     Effort: Pulmonary effort is normal.  Abdominal:     General: Abdomen is flat.     Tenderness: There is no abdominal tenderness. There is left CVA tenderness.  Musculoskeletal:        General: Normal range of motion.  Skin:    General: Skin is warm.  Neurological:     General: No focal deficit present.     Mental Status: She is alert.  Psychiatric:        Mood and Affect: Mood normal.    ED Results / Procedures / Treatments   Labs (all labs ordered are listed, but only abnormal results are displayed) Labs Reviewed  URINALYSIS, ROUTINE W REFLEX MICROSCOPIC - Abnormal; Notable for the following components:      Result Value    Color, Urine AMBER (*)    APPearance CLOUDY (*)    Hgb urine dipstick LARGE (*)  Protein, ur 100 (*)    RBC / HPF >50 (*)    All other components within normal limits    EKG None  Radiology CT Renal Stone Study  Result Date: 01/12/2021 CLINICAL DATA:  Left flank pain. EXAM: CT ABDOMEN AND PELVIS WITHOUT CONTRAST TECHNIQUE: Multidetector CT imaging of the abdomen and pelvis was performed following the standard protocol without IV contrast. COMPARISON:  None. FINDINGS: Lower chest: Mild biapical linear atelectasis is seen. Hepatobiliary: A 2.4 cm x 2.0 cm lobulated cyst is seen within the posteromedial aspect of the liver dome. Status post cholecystectomy. No biliary dilatation. Pancreas: Unremarkable. No pancreatic ductal dilatation or surrounding inflammatory changes. Spleen: Multiple punctate calcified granulomas are seen scattered throughout the spleen. Adrenals/Urinary Tract: Adrenal glands are unremarkable. Kidneys are normal in size, without focal lesions. 1 mm and 2 mm nonobstructing renal calculi are seen within the left kidney. A 2 mm obstructing renal calculus is seen within the mid left ureter (axial CT image 39, CT series 2). An additional 3 mm obstructing renal calculus is seen within the distal left ureter, near the left UVJ. There is mild left-sided hydronephrosis and hydroureter. The urinary bladder is poorly distended and subsequently limited in evaluation. Stomach/Bowel: Stomach is within normal limits. Appendix appears normal. No evidence of bowel wall thickening, distention, or inflammatory changes. Vascular/Lymphatic: No significant vascular findings are present. No enlarged abdominal or pelvic lymph nodes. Reproductive: Uterus and bilateral adnexa are unremarkable. Other: No abdominal wall hernia or abnormality. No abdominopelvic ascites. Musculoskeletal: No acute or significant osseous findings. IMPRESSION: 1. Mild left-sided hydronephrosis and hydroureter secondary to two  obstructing renal calculi within the mid left ureter and distal left ureter, as described above. 2. Subcentimeter nonobstructing left renal calculi. 3. Hepatic cyst. 4. Evidence of prior cholecystectomy. Electronically Signed   By: Aram Candela M.D.   On: 01/12/2021 20:49    Procedures Procedures   Medications Ordered in ED Medications  ketorolac (TORADOL) 30 MG/ML injection 30 mg (has no administration in time range)  HYDROmorphone (DILAUDID) injection 0.5 mg (0.5 mg Intravenous Given 01/12/21 1959)  ondansetron (ZOFRAN) injection 4 mg (4 mg Intravenous Given 01/12/21 1959)    ED Course  I have reviewed the triage vital signs and the nursing notes.  Pertinent labs & imaging results that were available during my care of the patient were reviewed by me and considered in my medical decision making (see chart for details).    MDM Rules/Calculators/A&P                         This patient presents to the ED for concern of  Flank pain, this involves an extensive number of treatment options, and is a complaint that carries with it a high risk of complications and morbidity.  The differential diagnosis includes kidney infection, kidney stone, musculoskeletal pain   Co morbidities that complicate the patient evaluation Asthma   Additional history obtained:  Additional history obtained from husband External records from outside source obtained and reviewed including primary care and pulmonologist    Lab Tests:  I Ordered, and personally interpreted labs.  The pertinent results include u/a.  Ua shows greater than 50 rbc's    Imaging Studies ordered:  I ordered imaging studies including ct renal  I independently visualized and interpreted imaging which showed a 1 and 9mm renal stone and a 53mm and 76mm stone in distal and mid ureter.  I agree with the radiologist interpretation  Cardiac Monitoring:  The patient was maintained on a cardiac monitor.  I personally viewed and  interpreted the cardiac monitored which showed an underlying rhythm of: normal sinus   Medicines ordered and prescription drug management:  I ordered medication including dilaudid and zofran for pain and nausea  Reevaluation of the patient after these medicines showed that the patient had improved pain but had a decrease in her blood pressure. I have reviewed the patients home medicines and have made adjustments as needed   Test Considered:  Cbc and bmet considered however pt had normal chemistry and cbc in November of this year    Critical Interventions:  IV fluids and pain medication given    Consultations Obtained:     Problem List / ED Course:  Kidney stone   Reevaluation:  After the interventions noted above, I reevaluated the patient and found that they have :improved   Dispostion:  After consideration of the diagnostic results and the patients response to treatment, I feel that the patent would benefit from pain and nausea management.  Pt advised to follow up with urologist for evaluation     Final Clinical Impression(s) / ED Diagnoses Final diagnoses:  Nephrolithiasis  Ureteral colic    Rx / DC Orders ED Discharge Orders          Ordered    ondansetron (ZOFRAN-ODT) 4 MG disintegrating tablet  Every 8 hours PRN        01/12/21 2252    oxyCODONE-acetaminophen (PERCOCET) 5-325 MG tablet  Every 4 hours PRN        01/12/21 2252          An After Visit Summary was printed and given to the patient.    Osie Cheeks 01/12/21 2252    Eber Hong, MD 01/17/21 [phone]

## 2021-01-12 NOTE — ED Triage Notes (Signed)
Pt c/o left flank pain that started in last 24 hours. Pt also c/o dysuria that just started.

## 2021-01-13 ENCOUNTER — Telehealth (HOSPITAL_COMMUNITY): Payer: Self-pay | Admitting: Student

## 2021-01-13 LAB — BASIC METABOLIC PANEL
Anion gap: 8 (ref 5–15)
BUN: 24 mg/dL — ABNORMAL HIGH (ref 8–23)
CO2: 30 mmol/L (ref 22–32)
Calcium: 9.1 mg/dL (ref 8.9–10.3)
Chloride: 102 mmol/L (ref 98–111)
Creatinine, Ser: 0.78 mg/dL (ref 0.44–1.00)
GFR, Estimated: >60 mL/min (ref >60)
Glucose, Bld: 152 mg/dL — ABNORMAL HIGH (ref 70–99)
Potassium: 4 mmol/L (ref 3.5–5.1)
Sodium: 140 mmol/L (ref 135–145)

## 2021-01-13 LAB — CBC WITH DIFFERENTIAL/PLATELET
Abs Immature Granulocytes: 0.03 10*3/uL (ref 0.00–0.07)
Basophils Absolute: 0.1 10*3/uL (ref 0.0–0.1)
Basophils Relative: 1 %
Eosinophils Absolute: 0.1 10*3/uL (ref 0.0–0.5)
Eosinophils Relative: 1 %
HCT: 45.2 % (ref 36.0–46.0)
Hemoglobin: 15 g/dL (ref 12.0–15.0)
Immature Granulocytes: 0 %
Lymphocytes Relative: 21 %
Lymphs Abs: 2.2 10*3/uL (ref 0.7–4.0)
MCH: 32.8 pg (ref 26.0–34.0)
MCHC: 33.2 g/dL (ref 30.0–36.0)
MCV: 98.7 fL (ref 80.0–100.0)
Monocytes Absolute: 0.7 10*3/uL (ref 0.1–1.0)
Monocytes Relative: 7 %
Neutro Abs: 7.5 10*3/uL (ref 1.7–7.7)
Neutrophils Relative %: 70 %
Platelets: 414 10*3/uL — ABNORMAL HIGH (ref 150–400)
RBC: 4.58 MIL/uL (ref 3.87–5.11)
RDW: 12.5 % (ref 11.5–15.5)
WBC: 10.6 10*3/uL — ABNORMAL HIGH (ref 4.0–10.5)
nRBC: 0 % (ref 0.0–0.2)

## 2021-01-13 MED ORDER — ONDANSETRON HCL 4 MG PO TABS: 4.0000 mg | ORAL_TABLET | Freq: Four times a day (QID) | ORAL | 0 refills | Status: DC | PRN

## 2021-01-13 NOTE — Telephone Encounter (Signed)
Seen by ED provider yesterday given prescription for Zofran sent to pharmacy was closed today.  Patient asked to have [facility] [name].

## 2021-01-13 NOTE — ED Provider Notes (Signed)
°  Physical Exam  BP 131/65    Pulse (!) 51    Temp 97.6 F (36.4 C) (Oral)    Resp 17    Ht 5\' 1"  (1.549 m)    Wt 50.8 kg    SpO2 99%    BMI 21.16 kg/m   Physical Exam Vitals and nursing note reviewed.  Constitutional:      General: She is not in acute distress.    Appearance: Normal appearance. She is not ill-appearing.  HENT:     Head: Normocephalic and atraumatic.  Pulmonary:     Effort: Pulmonary effort is normal.  Neurological:     General: No focal deficit present.     Mental Status: She is alert and oriented to person, place, and time.    ED Course/Procedures     Procedures  MDM  Care assumed from Dr. and Hyacinth Meeker at shift change.  Patient awaiting results of laboratory studies.  She initially presented here with flank pain and was found to have multiple small stones in the left ureter.  She received Dilaudid, then had some sort of reaction.  Laboratory studies were ordered and patient given additional fluids.  I assumed care awaiting reassessment.  Patient is now feeling better and seems appropriate for discharge.  She is afebrile and urine does not appear infected.  There is only minimal elevation of her white count that I suspect is related to pain/stress.  She will be discharged with pain medication and follow-up with urology if stones are not passing in the next few days.       Langston Masker, MD 01/13/21 [phone]

## 2021-01-17 ENCOUNTER — Ambulatory Visit (INDEPENDENT_AMBULATORY_CARE_PROVIDER_SITE_OTHER): Payer: Medicare Other | Admitting: Family Medicine

## 2021-01-17 ENCOUNTER — Encounter: Payer: Self-pay | Admitting: Family Medicine

## 2021-01-17 ENCOUNTER — Telehealth: Payer: Self-pay | Admitting: Family Medicine

## 2021-01-17 VITALS — BP 136/73 | HR 63 | Temp 97.2°F | Ht 61.0 in | Wt 118.8 lb

## 2021-01-17 DIAGNOSIS — Z23 Encounter for immunization: Secondary | ICD-10-CM | POA: Diagnosis not present

## 2021-01-17 DIAGNOSIS — Z7184 Encounter for health counseling related to travel: Secondary | ICD-10-CM | POA: Diagnosis not present

## 2021-01-17 DIAGNOSIS — N200 Calculus of kidney: Secondary | ICD-10-CM | POA: Diagnosis not present

## 2021-01-17 MED ORDER — AZITHROMYCIN 500 MG PO TABS: 500.0000 mg | ORAL_TABLET | Freq: Every day | ORAL | 0 refills | Status: DC

## 2021-01-17 MED ORDER — TAMSULOSIN HCL 0.4 MG PO CAPS: 0.4000 mg | ORAL_CAPSULE | Freq: Every day | ORAL | 3 refills | Status: DC

## 2021-01-17 MED ORDER — AZITHROMYCIN 500 MG PO TABS: 500.0000 mg | ORAL_TABLET | Freq: Every day | ORAL | 0 refills | Status: AC

## 2021-01-17 NOTE — Progress Notes (Signed)
Assessment & Plan:  1. Travel advice encounter - Azithromycin for if she develops traveler's diarrhea while out of the country - azithromycin (ZITHROMAX) 500 MG tablet; Take 1 tablet (500 mg total) by mouth daily for 3 days. Take if you develop traveler's diarrhea.  Dispense: 3 tablet; Refill: 0  2. Nephrolithiasis - Patient still has not passed her stones, add flomax to help pass until she can get seen by urology on 1/31. Education provided on kidney stones. - tamsulosin (FLOMAX) 0.4 MG CAPS capsule; Take 1 capsule (0.4 mg total) by mouth daily.  Dispense: 30 capsule; Refill: 3  3. Need for vaccination against hepatitis A - hep A vaccine today   Follow up plan: Return if symptoms worsen or fail to improve.  [name], NP Student  I personally was present during the history, physical exam, and medical decision-making activities of this service and have verified that the service and findings are accurately documented in the nurse practitioner student's note.  [name], MSN, APRN, FNP-C [name]  Subjective:   Patient ID: Denise Benton, female    DOB: 07/05/1950, 71 y.o.   MRN: [id number]  HPI: CHARMAN Benton is a 71 y.o. female presenting on 01/17/2021 for travelers diarrhea (Going out of town 1/14) and ER follow up (AP 12/31- Nephrolithiasis )  She was seen in the ED on 12/31 for and diagnosed with multiple small stones in her left ureter. She was given dilaudid for pain management there and had a reaction and received fluids. She remained afebrile and was encouraged to follow up with Urology outpatient. She has an appointment scheduled 1/31 and to her knowledge has not passed them yet. She was given zofran for nausea and advised to use tylenol and diclofenac for pain.   Her Renal CT showed 1 mm and 2 mm nonobstructing renal calculi are seen within the left kidney. A 2 mm obstructing renal calculus is seen within the mid left ureter (axial CT image  39, CT series 2). An additional 3 mm obstructing renal calculus is seen within the distal left ureter, near the left UVJ. There is mild left-sided hydronephrosis and hydroureter. The urinary bladder is poorly distended and subsequently limited in evaluation.  She is going to [place] in a few weeks and is requesting medication to treat traveler's diarrhea if it occurs. She is also requesting a hepatitis A vaccine today.   ROS: Negative unless specifically indicated above in HPI.   Relevant past medical history reviewed and updated as indicated.   Allergies and medications reviewed and updated.   Current Outpatient Medications:    acetaminophen (TYLENOL) 325 MG tablet, Take 325 mg by mouth as needed., Disp: , Rfl:    Alum & Mag Hydroxide-Simeth (MYLANTA PO), Take by mouth as needed. , Disp: , Rfl:    azithromycin (ZITHROMAX) 500 MG tablet, Take 1 tablet (500 mg total) by mouth daily for 3 days. Take if you develop traveler's diarrhea., Disp: 3 tablet, Rfl: 0   B Complex Vitamins (VITAMIN-B COMPLEX PO), Take 1 tablet by mouth daily., Disp: , Rfl:    Biotin 10000 MCG TABS, Take by mouth daily., Disp: , Rfl:    Calcium Carbonate (CALTRATE 600 PO), Take 1 tablet by mouth. Bi weekly, Disp: , Rfl:    chlorhexidine (PERIDEX) 0.12 % solution, SMARTSIG:15 By Mouth Morning-Evening, Disp: , Rfl:    Cholecalciferol (VITAMIN D) 50 MCG (2000 UT) CAPS, Take by mouth daily. , Disp: , Rfl:  Docosahexaenoic Acid (DHA PO), Take 1 tablet by mouth once a week. , Disp: , Rfl:    ELDERBERRY PO, Take 1 Dose by mouth as needed., Disp: , Rfl:    fluticasone-salmeterol (ADVAIR HFA) 115-21 MCG/ACT inhaler, Inhale 2 puffs into the lungs 2 (two) times daily. As needed, Disp: , Rfl:    folic acid (FOLVITE) Q000111Q MCG tablet, Take 400 mcg by mouth once a week. , Disp: , Rfl:    levOCARNitine-E-Coenzyme Q10 (CO Q-10 PLUS L-CARNITINE PO), Take 1 tablet by mouth daily., Disp: , Rfl:    montelukast (SINGULAIR) 10 MG tablet,  Take 1 tablet (10 mg total) by mouth at bedtime., Disp: 30 tablet, Rfl: 2   Multiple Vitamins-Minerals (EMERGEN-C IMMUNE PO), Take 1 tablet by mouth as needed., Disp: , Rfl:    Omega-3 Fatty Acids (EPA PO), Take 1 tablet by mouth 3 (three) times a week. , Disp: , Rfl:    tamsulosin (FLOMAX) 0.4 MG CAPS capsule, Take 1 capsule (0.4 mg total) by mouth daily., Disp: 30 capsule, Rfl: 3   albuterol (VENTOLIN HFA) 108 (90 Base) MCG/ACT inhaler, Inhale into the lungs., Disp: , Rfl:   Allergies  Allergen Reactions   Ivp Dye [Iodinated Contrast Media] Other (See Comments)    Respiratory distress   Covid-19 (Mrna) Vaccine     Flashing lights in visit, headaches, arm numbness, inability to move   Codeine Nausea Only   Niaspan [Niacin Er] Rash and Other (See Comments)    Nightmares    Objective:   BP 136/73    Pulse 63    Temp (!) 97.2 F (36.2 C) (Temporal)    Ht 5\' 1"  (1.549 m)    Wt 53.9 kg    SpO2 100%    BMI 22.45 kg/m    Physical Exam Vitals reviewed.  Constitutional:      General: She is not in acute distress.    Appearance: Normal appearance. She is not ill-appearing, toxic-appearing or diaphoretic.  HENT:     Head: Normocephalic and atraumatic.     Nose: Nose normal.     Mouth/Throat:     Mouth: Mucous membranes are moist.     Pharynx: Oropharynx is clear.  Eyes:     Extraocular Movements: Extraocular movements intact.     Conjunctiva/sclera: Conjunctivae normal.     Pupils: Pupils are equal, round, and reactive to light.  Pulmonary:     Effort: Pulmonary effort is normal. No respiratory distress.  Abdominal:     General: There is no distension.     Palpations: Abdomen is soft. There is no mass.  Musculoskeletal:        General: Normal range of motion.     Cervical back: Normal range of motion.  Skin:    General: Skin is warm and dry.  Neurological:     General: No focal deficit present.     Mental Status: She is alert and oriented to person, place, and time.      Motor: No weakness.     Gait: Gait normal.  Psychiatric:        Mood and Affect: Mood normal.        Behavior: Behavior normal.        Thought Content: Thought content normal.        Judgment: Judgment normal.

## 2021-01-17 NOTE — Telephone Encounter (Signed)
Pt called to let PCP know that Rx's that were sent in for her today were sent to wrong pharmacy. [facility] [place]

## 2021-01-17 NOTE — Telephone Encounter (Signed)
Medications have been re sent - patient aware

## 2021-01-20 ENCOUNTER — Other Ambulatory Visit: Payer: Self-pay

## 2021-01-20 ENCOUNTER — Ambulatory Visit
Admission: RE | Admit: 2021-01-20 | Discharge: 2021-01-20 | Disposition: A | Discharge status: Home / Self Care | Payer: TRICARE For Life (TFL) | Source: Ambulatory Visit | Attending: Psychiatry | Admitting: Psychiatry

## 2021-01-20 DIAGNOSIS — R413 Other amnesia: Secondary | ICD-10-CM

## 2021-01-20 MED ORDER — GADOBENATE DIMEGLUMINE 529 MG/ML IV SOLN
10.0000 mL | Freq: Once | INTRAVENOUS | Status: AC | PRN
  Administered 2021-01-20: 10 mL via INTRAVENOUS

## 2021-01-21 ENCOUNTER — Telehealth: Payer: Self-pay

## 2021-01-21 NOTE — Telephone Encounter (Signed)
Contacted pt, informed her MRI was normal. advised to call office back with questions as she had none at this time and was appreciative.

## 2021-01-21 NOTE — Telephone Encounter (Signed)
-----   Message from Ocie Doyne, MD sent at 01/21/2021  8:17 AM EST ----- Brain MRI is normal

## 2021-01-22 ENCOUNTER — Other Ambulatory Visit: Payer: Self-pay

## 2021-01-22 ENCOUNTER — Emergency Department (HOSPITAL_COMMUNITY)
Admission: EM | Admit: 2021-01-22 | Discharge: 2021-01-22 | Disposition: A | Discharge status: Home / Self Care | Payer: Medicare Other | Attending: Emergency Medicine | Admitting: Emergency Medicine

## 2021-01-22 ENCOUNTER — Encounter (HOSPITAL_COMMUNITY): Payer: Self-pay | Admitting: Emergency Medicine

## 2021-01-22 DIAGNOSIS — I10 Essential (primary) hypertension: Secondary | ICD-10-CM | POA: Diagnosis not present

## 2021-01-22 DIAGNOSIS — Z5321 Procedure and treatment not carried out due to patient leaving prior to being seen by health care provider: Secondary | ICD-10-CM | POA: Insufficient documentation

## 2021-01-22 NOTE — ED Triage Notes (Signed)
Pt with c/o hypertension x 2 days. States her BP has been running 170-180's systolic over 100 diastolic. Pt dx with 4 kidney stones over New Years and states she is still having L flank pain "off and on".

## 2021-02-11 NOTE — Progress Notes (Signed)
H&P  Chief Complaint: Lt sided kidney stones  History of Present Illness: Denise Benton is a 71 y.o. year old female presents for follow-up of left ureteral calculi.  Presented to the emergency room with acute onset left flank pain about a month ago.  CT revealed an upper and lower left pole stone, each quite small and a left mid and left distal ureteral stone, also both small with minimal left hydronephrosis.  She was symptomatic for about a day but has had no pain or lower urinary tract symptoms since that time.  She has a remote history of having passed 1 stone in the past.  Past Medical History:  Diagnosis Date   Allergy    seasonal   Arthritis    Asthma    Duodenal ulcer    Gallstones    GERD (gastroesophageal reflux disease)    HLD (hyperlipidemia)    IBS (irritable bowel syndrome)    Kidney stones    Lumbosacral disc disease    L4-5, L5-S1   Osteopenia     Past Surgical History:  Procedure Laterality Date   BREAST CYST ASPIRATION Right    pt has cysts aspiration done for lumps   CESAREAN SECTION  1980   x2; again in 1984   LAPAROSCOPIC CHOLECYSTECTOMY  2000   SKIN LESION EXCISION  07/2011   from scalp - keratosis    Home Medications:  (Not in a hospital admission)   Allergies:  Allergies  Allergen Reactions   Ivp Dye [Iodinated Contrast Media] Other (See Comments)    Respiratory distress   Covid-19 (Mrna) Vaccine     Flashing lights in visit, headaches, arm numbness, inability to move   Codeine Nausea Only   Niaspan [Niacin Er] Rash and Other (See Comments)    Nightmares    Family History  Problem Relation Age of Onset   Tremor Mother    Hyperlipidemia Mother    Skin cancer Mother    Dementia Mother    Congestive Heart Failure Father    Diabetes Father    Heart disease Father        congestive heart failure   Multiple sclerosis Sister    Heart disease Sister    Kidney Stones Sister    Hypertension Daughter    Anxiety disorder Daughter     Diabetes Paternal Grandfather    Tremor Sister    Colon cancer Neg Hx    Esophageal cancer Neg Hx    Stomach cancer Neg Hx    Rectal cancer Neg Hx    Inflammatory bowel disease Neg Hx    Liver disease Neg Hx    Pancreatic cancer Neg Hx     Social History:  reports that she quit smoking about 52 years ago. Her smoking use included cigarettes. She has a 0.50 pack-year smoking history. She has never used smokeless tobacco. She reports that she does not drink alcohol and does not use drugs.  ROS: A complete review of systems was performed.  All systems are negative except for pertinent findings as noted.  Physical Exam:  Vital signs in last 24 hours: @VSRANGES @ General:  Alert and oriented, No acute distress HEENT: Normocephalic, atraumatic Neck: No JVD or lymphadenopathy Cardiovascular: Regular rate  Lungs: Normal inspiratory/expiratory excursion Extremities: No edema Neurologic: Grossly intact  I have reviewed prior pt notes  I have reviewed notes from referring/previous physicians-ER notes reviewed  I have reviewed urinalysis results  I have independently reviewed prior imaging-I reviewed CT images with the  patient   Impression/Assessment:  1.  Left ureteral calculi, most likely passed as they were quite small and she is asymptomatic for the better part of a month  2.  Remaining left upper and lower pole calculi, small  Plan:  1.  I reassured her that her serum calcium levels are normal  2.  I sent her out with a 24-hour urine request.  I will send results as well as dietary recommendations  3.  Otherwise, office visit as needed  Chelsea Aus 02/11/2021, 7:24 PM  Bertram Millard. Fama Muenchow MD

## 2021-02-12 ENCOUNTER — Other Ambulatory Visit: Payer: Self-pay

## 2021-02-12 ENCOUNTER — Encounter: Payer: Self-pay | Admitting: Urology

## 2021-02-12 ENCOUNTER — Ambulatory Visit (INDEPENDENT_AMBULATORY_CARE_PROVIDER_SITE_OTHER): Payer: Medicare Other | Admitting: Urology

## 2021-02-12 VITALS — BP 117/71 | HR 75 | Wt 116.0 lb

## 2021-02-12 DIAGNOSIS — N201 Calculus of ureter: Secondary | ICD-10-CM | POA: Diagnosis not present

## 2021-02-12 LAB — URINALYSIS, ROUTINE W REFLEX MICROSCOPIC
Bilirubin, UA: NEGATIVE
Glucose, UA: NEGATIVE
Ketones, UA: NEGATIVE
Leukocytes,UA: NEGATIVE
Nitrite, UA: NEGATIVE
Protein,UA: NEGATIVE
RBC, UA: NEGATIVE
Specific Gravity, UA: 1.025 (ref 1.005–1.030)
Urobilinogen, Ur: 0.2 mg/dL (ref 0.2–1.0)
pH, UA: 5.5 (ref 5.0–7.5)

## 2021-02-12 NOTE — Progress Notes (Signed)
Urological Symptom Review  Patient is experiencing the following symptoms: Frequent urination Burning/pain with urination Painful intercourse   Review of Systems  Gastrointestinal (upper)  : Indigestion/heartburn  Gastrointestinal (lower) : Negative for lower GI symptoms  Constitutional : Negative for symptoms  Skin: Negative for skin symptoms  Eyes: Negative for eye symptoms  Ear/Nose/Throat : Negative for Ear/Nose/Throat symptoms  Hematologic/Lymphatic: Negative for Hematologic/Lymphatic symptoms  Cardiovascular : Negative for cardiovascular symptoms  Respiratory : Negative for respiratory symptoms  Endocrine: Negative for endocrine symptoms  Musculoskeletal: Negative for musculoskeletal symptoms  Neurological: Headaches  Psychologic: Negative for psychiatric symptoms

## 2021-02-26 ENCOUNTER — Other Ambulatory Visit: Payer: Self-pay | Admitting: Urology

## 2021-03-07 ENCOUNTER — Encounter: Payer: Self-pay | Admitting: Family Medicine

## 2021-03-07 ENCOUNTER — Ambulatory Visit (INDEPENDENT_AMBULATORY_CARE_PROVIDER_SITE_OTHER): Payer: Medicare Other | Admitting: Family Medicine

## 2021-03-07 VITALS — BP 107/74 | HR 83 | Temp 97.2°F | Ht 61.0 in | Wt 118.8 lb

## 2021-03-07 DIAGNOSIS — M8589 Other specified disorders of bone density and structure, multiple sites: Secondary | ICD-10-CM | POA: Diagnosis not present

## 2021-03-07 DIAGNOSIS — K219 Gastro-esophageal reflux disease without esophagitis: Secondary | ICD-10-CM | POA: Diagnosis not present

## 2021-03-07 DIAGNOSIS — Z1159 Encounter for screening for other viral diseases: Secondary | ICD-10-CM

## 2021-03-07 DIAGNOSIS — R030 Elevated blood-pressure reading, without diagnosis of hypertension: Secondary | ICD-10-CM

## 2021-03-07 DIAGNOSIS — E782 Mixed hyperlipidemia: Secondary | ICD-10-CM

## 2021-03-07 DIAGNOSIS — J4530 Mild persistent asthma, uncomplicated: Secondary | ICD-10-CM

## 2021-03-07 DIAGNOSIS — Z0000 Encounter for general adult medical examination without abnormal findings: Secondary | ICD-10-CM

## 2021-03-07 DIAGNOSIS — Z7189 Other specified counseling: Secondary | ICD-10-CM

## 2021-03-07 LAB — LIPID PANEL

## 2021-03-07 NOTE — Progress Notes (Signed)
Assessment & Plan:  1. Mixed hyperlipidemia Labs to assess. - CBC with Differential/Platelet - CMP14+EGFR - Lipid panel - TSH  2. Mild persistent asthma without complication Well controlled on current regimen.   3. Gastroesophageal reflux disease, unspecified whether esophagitis present Well controlled on current regimen.  - omeprazole (PRILOSEC OTC) 20 MG tablet; Take 20 mg by mouth daily. - CMP14+EGFR  4. Osteopenia of multiple sites Continue calcium and vitamin D supplements.  5. Elevated BP without diagnosis of hypertension Reassurance provided that she does not need blood pressure medication. Education provided on hypertension.  6. ACP (advance care planning) Encouraged to bring a copy of current documents to the office to be scanned into her chart.   7. Healthcare maintenance Our referral coordinator is messaging [facility] to see if they will accept patient for a colonoscopy.   8. Encounter for hepatitis C screening test for low risk patient - Hepatitis C antibody (reflex, frozen specimen) - Interpretation:   Follow-up: Return in about 6 months (around 09/04/2021) for follow-up of chronic medication conditions.   [name], MSN, APRN, FNP-C [name]  Subjective:  Patient ID: Denise Benton, female    DOB: Apr 26, 1950  Age: 71 y.o. MRN: [id number]  Patient Care Team: Loman Brooklyn, FNP as PCP - General (Family Medicine) Noralyn Pick, NP as Nurse Practitioner (Gastroenterology) Sandi Carne, MD (Chiropractic Medicine) Tanda Rockers, MD as Consulting Physician (Pulmonary Disease) Rozetta Nunnery, MD (Inactive) as Consulting Physician (Otolaryngology)   CC:  Chief Complaint  Patient presents with   Medical Management of Chronic Issues   Headache    On and off- neuro did MRI and was fine    Hypertension    HPI Denise Benton presents for management of chronic conditions.  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 A referral was placed in November to [facility], but they would not see patient since she had seen [facility] in the past for other issues. Patient prefers to be seen at [facility] since her husband is also a patient there. Last mammogram: 11/28/2019 - patient to schedule  DEXA: 01/28/2019 with osteopenia - repeat after 3 years Hepatitis C Screening: agreeable in completing today Immunizations: Flu Vaccine: up to date Tdap Vaccine: up to date  Shingrix Vaccine: declined  COVID-19 Vaccine:  declined booster Pneumonia Vaccine: up to date  Advanced Directives Patient does have advanced directives including living will and healthcare power of attorney. She does not have a copy in the electronic medical record.   DEPRESSION SCREENING PHQ 2/9 Scores 03/07/2021 01/17/2021 12/25/2020 12/04/2020 06/12/2020 06/08/2020 11/08/2019  PHQ - 2 Score 0 0 - 0 0 0 0  PHQ- 9 Score 4 3 - 4 2 - -  Exception Documentation - - Other- indicate reason in comment box - - - -  Not completed - - pt declined, just answered last month - - - -     Hyperlipidemia: not on medication to treat as it is not yet indicated.  Asthma: has Advair to use twice daily, Albuterol as needed, and Singulair nightly.   GERD: recently added omeprazole back.   Osteopenia: taking calcium and vitamin D supplements.   Blood pressure: patient is very concerned  about her blood pressure as she is occasionally having spikes for unknown reasons. She has been keeping a log and is unable to relate this increases with anything she is doing. She has been eating out more and wonders if that is the cause. Systolic  blood pressures at home range 95-193 with 1/98 <100 and 13/98 >150. Diastolic ranges 32-20 with 3/98 >90. Heart rate ranges 49-104 with 1/98 <50 and 1/98 >100.    Review of Systems  Constitutional:  Negative for chills, fever, malaise/fatigue and weight loss.  HENT:  Negative for congestion, ear discharge, ear pain, nosebleeds, sinus pain, sore throat and tinnitus.   Eyes:  Negative for blurred vision, double vision, pain, discharge and redness.  Respiratory:  Negative for cough, shortness of breath and wheezing.   Cardiovascular:  Negative for chest pain, palpitations and leg swelling.  Gastrointestinal:  Positive for heartburn. Negative for abdominal pain, constipation, diarrhea, nausea and vomiting.  Genitourinary:  Negative for dysuria, frequency and urgency.  Musculoskeletal:  Negative for myalgias.  Skin:  Negative for rash.  Neurological:  Negative for dizziness, seizures, weakness and headaches.  Psychiatric/Behavioral:  Negative for depression, substance abuse and suicidal ideas. The patient is not nervous/anxious.     Current Outpatient Medications:    acetaminophen (TYLENOL) 325 MG tablet, Take 325 mg by mouth as needed., Disp: , Rfl:    Alum & Mag Hydroxide-Simeth (MYLANTA PO), Take by mouth as needed. , Disp: , Rfl:    B Complex Vitamins (VITAMIN-B COMPLEX PO), Take 1 tablet by mouth daily., Disp: , Rfl:    Biotin 10000 MCG TABS, Take by mouth daily., Disp: , Rfl:    Calcium Carbonate (CALTRATE 600 PO), Take 1 tablet by mouth. Bi weekly, Disp: , Rfl:    chlorhexidine (PERIDEX) 0.12 % solution, SMARTSIG:15 By Mouth Morning-Evening, Disp: , Rfl:    Cholecalciferol (VITAMIN D) 50 MCG (2000 UT) CAPS, Take by mouth daily. , Disp: , Rfl:    Docosahexaenoic Acid (DHA PO), Take 1 tablet by mouth once a week. , Disp: , Rfl:    ELDERBERRY PO, Take 1 Dose by mouth as needed., Disp: , Rfl:    fluticasone-salmeterol (ADVAIR HFA) 115-21 MCG/ACT inhaler, Inhale 2 puffs into the lungs 2 (two)  times daily. As needed, Disp: , Rfl:    folic acid (FOLVITE) 254 MCG tablet, Take 400 mcg by mouth once a week. , Disp: , Rfl:    levOCARNitine-E-Coenzyme Q10 (CO Q-10 PLUS L-CARNITINE PO), Take 1 tablet by mouth daily., Disp: , Rfl:    montelukast (SINGULAIR) 10 MG tablet, Take 1 tablet (10 mg total) by mouth at bedtime., Disp: 30 tablet, Rfl: 2   Multiple Vitamins-Minerals (EMERGEN-C IMMUNE PO), Take 1 tablet by mouth as needed., Disp: , Rfl:    Omega-3 Fatty Acids (EPA PO), Take 1 tablet by mouth 3 (three) times a week. , Disp: , Rfl:    omeprazole (PRILOSEC OTC) 20 MG tablet, Take 20 mg by mouth daily., Disp: , Rfl:    tamsulosin (FLOMAX) 0.4 MG CAPS capsule, Take 1 capsule (0.4 mg total) by mouth daily., Disp: 30 capsule, Rfl: 3   albuterol (VENTOLIN HFA) 108 (90 Base) MCG/ACT inhaler, Inhale into the lungs., Disp: , Rfl:   Allergies  Allergen Reactions   Ivp Dye [Iodinated Contrast Media] Other (See Comments)    Respiratory distress   Covid-19 (Mrna) Vaccine     Flashing lights in visit, headaches, arm numbness, inability to move Flashing lights in visit, headaches, arm numbness,  inability to move   Codeine Nausea Only   Niaspan [Niacin Er] Rash and Other (See Comments)    Nightmares    Past Medical History:  Diagnosis Date   Allergy    seasonal   Arthritis    Asthma    Duodenal ulcer    Gallstones    GERD (gastroesophageal reflux disease)    HLD (hyperlipidemia)    IBS (irritable bowel syndrome)    Kidney stones    Lumbosacral disc disease    L4-5, L5-S1   Osteopenia     Past Surgical History:  Procedure Laterality Date   BREAST CYST ASPIRATION Right    pt has cysts aspiration done for lumps   CESAREAN SECTION  1980   x2; again in [place]  07/2011   from scalp - keratosis    Family History  Problem Relation Age of Onset   Tremor Mother    Hyperlipidemia Mother    Skin cancer Mother    Dementia  Mother    Congestive Heart Failure Father    Diabetes Father    Heart disease Father        congestive heart failure   Multiple sclerosis Sister    Heart disease Sister    Kidney Stones Sister    Hypertension Daughter    Anxiety disorder Daughter    Diabetes Paternal Grandfather    Tremor Sister    Colon cancer Neg Hx    Esophageal cancer Neg Hx    Stomach cancer Neg Hx    Rectal cancer Neg Hx    Inflammatory bowel disease Neg Hx    Liver disease Neg Hx    Pancreatic cancer Neg Hx     Social History   Socioeconomic History   Marital status: Married    Spouse name: Jeneen Rinks    Number of children: 2   Years of education: 4 years college   Highest education level: Bachelor's degree (e.g., BA, AB, BS)  Occupational History   Occupation: Retired RN/NP   Occupation: Works part time at [employer]   Smoking status: Former    Packs/day: 0.50    Years: 1.00    Pack years: 0.50    Types: Cigarettes    Quit date: 01/13/1969    Years since quitting: 52.1   Smokeless tobacco: Never  Vaping Use   Vaping Use: Never used  Substance and Sexual Activity   Alcohol use: No   Drug use: No   Sexual activity: Not Currently  Other Topics Concern   Not on file  Social History Narrative   Lives home with husband - daughter in [place] here from [place] in 2020   Social Determinants of Health   Financial Resource Strain: Low Risk    Difficulty of Paying Living Expenses: Not hard at all  Food Insecurity: No Food Insecurity   Worried About [name] fundraiser in the Last Year: Never true   Arboriculturist in the Last Year: Never true  Transportation Needs: No Transportation Needs   Lack of Transportation (Medical): No   Lack of Transportation (Non-Medical): No  Physical Activity: Sufficiently Active   Days of Exercise per Week: 7 days   Minutes of Exercise per Session: 30 min  Stress: No Stress Concern Present   Feeling of Stress : Not at all  Social  Connections: Moderately Integrated   Frequency of Communication with Friends  and Family: More than three times a week   Frequency of Social Gatherings with Friends and Family: More than three times a week   Attends Religious Services: 1 to 4 times per year   Active Member of [employer] or Organizations: No   Attends Archivist Meetings: Never   Marital Status: Married  Human resources officer Violence: Not At Risk   Fear of Current or Ex-Partner: No   Emotionally Abused: No   Physically Abused: No   Sexually Abused: No      Objective:    BP 107/74    Pulse 83    Temp (!) 97.2 F (36.2 C) (Temporal)    Ht _0  (1.549 m)    Wt 118 lb 12.8 oz (53.9 kg)    SpO2 100%    BMI 22.45 kg/m   Wt Readings from Last 3 Encounters:  03/07/21 118 lb 12.8 oz (53.9 kg)  02/12/21 116 lb (52.6 kg)  01/17/21 118 lb 12.8 oz (53.9 kg)    Physical Exam Vitals reviewed.  Constitutional:      General: She is not in acute distress.    Appearance: Normal appearance. She is normal weight. She is not ill-appearing, toxic-appearing or diaphoretic.  HENT:     Head: Normocephalic and atraumatic.     Right Ear: Tympanic membrane, ear canal and external ear normal. There is no impacted cerumen.     Left Ear: Tympanic membrane, ear canal and external ear normal. There is no impacted cerumen.     Nose: Nose normal. No congestion or rhinorrhea.     Mouth/Throat:     Mouth: Mucous membranes are moist.     Pharynx: Oropharynx is clear. No oropharyngeal exudate or posterior oropharyngeal erythema.  Eyes:     General: No scleral icterus.       Right eye: No discharge.        Left eye: No discharge.     Conjunctiva/sclera: Conjunctivae normal.     Pupils: Pupils are equal, round, and reactive to light.  Cardiovascular:     Rate and Rhythm: Normal rate and regular rhythm.     Heart sounds: Normal heart sounds. No murmur heard.   No friction rub. No gallop.  Pulmonary:     Effort: Pulmonary effort is normal. No  respiratory distress.     Breath sounds: Normal breath sounds. No stridor. No wheezing, rhonchi or rales.  Abdominal:     General: Abdomen is flat. Bowel sounds are normal. There is no distension.     Palpations: Abdomen is soft. There is no hepatomegaly, splenomegaly or mass.     Tenderness: There is no abdominal tenderness. There is no guarding or rebound.     Hernia: No hernia is present.  Musculoskeletal:        General: Normal range of motion.     Cervical back: Normal range of motion and neck supple. No rigidity. No muscular tenderness.  Lymphadenopathy:     Cervical: No cervical adenopathy.  Skin:    General: Skin is warm and dry.     Capillary Refill: Capillary refill takes less than 2 seconds.  Neurological:     General: No focal deficit present.     Mental Status: She is alert and oriented to person, place, and time. Mental status is at baseline.  Psychiatric:        Mood and Affect: Mood normal.        Behavior: Behavior normal.  Thought Content: Thought content normal.        Judgment: Judgment normal.    Lab Results  Component Value Date   TSH 4.570 (H) 12/04/2020   Lab Results  Component Value Date   WBC 10.6 (H) 01/12/2021   HGB 15.0 01/12/2021   HCT 45.2 01/12/2021   MCV 98.7 01/12/2021   PLT 414 (H) 01/12/2021   Lab Results  Component Value Date   NA 140 01/12/2021   K 4.0 01/12/2021   CO2 30 01/12/2021   GLUCOSE 152 (H) 01/12/2021   BUN 24 (H) 01/12/2021   CREATININE 0.78 01/12/2021   BILITOT 0.5 12/04/2020   ALKPHOS 90 12/04/2020   AST 27 12/04/2020   ALT 18 12/04/2020   PROT 6.4 12/04/2020   ALBUMIN 4.4 12/04/2020   CALCIUM 9.1 01/12/2021   ANIONGAP 8 01/12/2021   EGFR 74 12/04/2020   GFR 69.19 03/22/2019   Lab Results  Component Value Date   CHOL 210 (H) 12/04/2020   Lab Results  Component Value Date   HDL 78 12/04/2020   Lab Results  Component Value Date   LDLCALC 113 (H) 12/04/2020   Lab Results  Component Value Date    TRIG 112 12/04/2020   Lab Results  Component Value Date   CHOLHDL 2.7 12/04/2020   No results found for: HGBA1C

## 2021-03-08 LAB — CMP14+EGFR
ALT: 16 IU/L (ref 0–32)
AST: 24 IU/L (ref 0–40)
Albumin/Globulin Ratio: 2.1 (ref 1.2–2.2)
Albumin: 4.2 g/dL (ref 3.8–4.8)
Alkaline Phosphatase: 87 IU/L (ref 44–121)
BUN/Creatinine Ratio: 19 (ref 12–28)
BUN: 18 mg/dL (ref 8–27)
Bilirubin Total: 0.7 mg/dL (ref 0.0–1.2)
CO2: 26 mmol/L (ref 20–29)
Calcium: 9.8 mg/dL (ref 8.7–10.3)
Chloride: 104 mmol/L (ref 96–106)
Creatinine, Ser: 0.93 mg/dL (ref 0.57–1.00)
Globulin, Total: 2 g/dL (ref 1.5–4.5)
Glucose: 76 mg/dL (ref 70–99)
Potassium: 5 mmol/L (ref 3.5–5.2)
Sodium: 144 mmol/L (ref 134–144)
Total Protein: 6.2 g/dL (ref 6.0–8.5)
eGFR: 66 mL/min/{1.73_m2} (ref >59)

## 2021-03-08 LAB — HCV INTERPRETATION

## 2021-03-08 LAB — CBC WITH DIFFERENTIAL/PLATELET
Basophils Absolute: 0.1 10*3/uL (ref 0.0–0.2)
Basos: 1 %
EOS (ABSOLUTE): 0.1 10*3/uL (ref 0.0–0.4)
Eos: 1 %
Hematocrit: 40.6 % (ref 34.0–46.6)
Hemoglobin: 13.2 g/dL (ref 11.1–15.9)
Immature Grans (Abs): 0 10*3/uL (ref 0.0–0.1)
Immature Granulocytes: 0 %
Lymphocytes Absolute: 2 10*3/uL (ref 0.7–3.1)
Lymphs: 32 %
MCH: 30.3 pg (ref 26.6–33.0)
MCHC: 32.5 g/dL (ref 31.5–35.7)
MCV: 93 fL (ref 79–97)
Monocytes Absolute: 0.6 10*3/uL (ref 0.1–0.9)
Monocytes: 9 %
Neutrophils Absolute: 3.6 10*3/uL (ref 1.4–7.0)
Neutrophils: 57 %
Platelets: 355 10*3/uL (ref 150–450)
RBC: 4.35 x10E6/uL (ref 3.77–5.28)
RDW: 11.8 % (ref 11.7–15.4)
WBC: 6.2 10*3/uL (ref 3.4–10.8)

## 2021-03-08 LAB — LIPID PANEL
Chol/HDL Ratio: 2.8 ratio (ref 0.0–4.4)
Cholesterol, Total: 198 mg/dL (ref 100–199)
HDL: 71 mg/dL (ref >39)
LDL Chol Calc (NIH): 108 mg/dL — ABNORMAL HIGH (ref 0–99)
Triglycerides: 108 mg/dL (ref 0–149)
VLDL Cholesterol Cal: 19 mg/dL (ref 5–40)

## 2021-03-08 LAB — TSH: TSH: 3.77 u[IU]/mL (ref 0.450–4.500)

## 2021-03-08 LAB — HCV AB W REFLEX TO QUANT PCR: HCV Ab: NONREACTIVE

## 2021-03-10 ENCOUNTER — Encounter: Payer: Self-pay | Admitting: Family Medicine

## 2021-03-10 DIAGNOSIS — J4530 Mild persistent asthma, uncomplicated: Secondary | ICD-10-CM | POA: Insufficient documentation

## 2021-03-10 DIAGNOSIS — E782 Mixed hyperlipidemia: Secondary | ICD-10-CM | POA: Insufficient documentation

## 2021-03-12 ENCOUNTER — Other Ambulatory Visit (HOSPITAL_COMMUNITY): Payer: Self-pay | Admitting: Occupational Therapy

## 2021-03-12 DIAGNOSIS — Z1231 Encounter for screening mammogram for malignant neoplasm of breast: Secondary | ICD-10-CM

## 2021-03-14 ENCOUNTER — Telehealth: Payer: Self-pay

## 2021-03-14 NOTE — Telephone Encounter (Signed)
Message left to return call to office. 

## 2021-03-14 NOTE — Telephone Encounter (Signed)
-----   Message from Marcine Matar, MD sent at 03/14/2021 12:40 PM EST ----- ?Let patient know that I viewed her 24-hour urine results.  All the constituents in her urine looked fine.  The big abnormality is her urine volume is very low.  Increasing her fluid intake on a regular basis throughout the day such that she makes about 2 L of urine a day will significantly decrease the risk of further stones. ?----- Message ----- ?From: Ferdinand Lango, RN ?Sent: 02/26/2021   9:43 AM EST ?To: Marcine Matar, MD, [facility], LPN ? ?24 hour urine- please review ? ?

## 2021-03-18 NOTE — Telephone Encounter (Signed)
Patient left a voicemail Fri. 03-15-2021 ? ?Patient returned missed call. ? ?Call back:  [phone] ? ?Thanks,  ?Rosey Bath ?

## 2021-03-19 NOTE — Telephone Encounter (Signed)
Results mailed to patient;.

## 2021-03-21 ENCOUNTER — Institutional Professional Consult (permissible substitution): Payer: Medicare Other | Admitting: Neurology

## 2021-04-08 NOTE — Progress Notes (Signed)
? ?  CC:  headaches, memory loss ? ?Follow-up Visit ? ?Last visit: 12/31/20 ? ?Brief HPI: ?71 year old female with a history of asthma, nephrolithiais who follows in clinic for headaches and memory loss following the COVID vaccine in summer 2022. ? ?At her last visit, brain MRI was ordered. She was referred to Sleep for OSA evaluation. ? ?Interval History: ?Headaches have mostly resolved since her last visit. Brain fog is about the same. She notices it is worst when she does not get enough sleep. She is currently waking up 2-3 times a night. Sometimes feels she is not getting enough air at night. She has an appointment for Sleep evaluation scheduled for May. She has also started a new job which can be exhausting some days.  ? ?MRI brain 01/20/21 was normal. ? ? ?Physical Exam:  ? ?Vital Signs: ?BP (!) 143/78   Pulse (!) 55   Ht 5\' 1"  (1.549 m)   Wt 121 lb (54.9 kg)   BMI 22.86 kg/m?  ? ?GENERAL:  well appearing, in no acute distress, alert  ?SKIN:  Color, texture, turgor normal. No rashes or lesions ?HEAD:  Normocephalic/atraumatic. ?RESP: normal respiratory effort ?MSK:  No gross joint deformities.  ? ?NEUROLOGICAL: ?MMSE 30/30 ?Mental Status: Alert, oriented to person, place and time, Follows commands, and Speech fluent and appropriate. ?Cranial Nerves: PERRL, face symmetric, no dysarthria, hearing grossly intact ?Motor: moves all extremities equally ?Gait: normal-based. ? ?IMPRESSION: ?71 year old female with a history of asthma and kidney stones who presents for follow up of headaches and memory loss following COVID vaccine. Headaches have improved but she continues to struggle with memory loss and brain fog. She feels her symptoms are highly associated with lack of sleep, and would prefer to undergo sleep evaluation before pursuing any further memory workup. Sleep appointment is scheduled for May. Discussed that if this evaluation is unremarkable will consider referral for neuropsychological testing and  cognitive rehab. ? ?PLAN: ?-Sleep appointment scheduled for May ?-If Sleep evaluation unremarkable will consider neuropsych testing and cognitive rehab referral ? ? ?Follow-up: after testing ? ?I spent a total of 22 minutes on the date of the service. Discussed medication side effects, adverse reactions and drug interactions. Written educational materials and patient instructions outlining all of the above were given. ? ?June, MD ?04/09/21 ?11:22 AM ? ?

## 2021-04-09 ENCOUNTER — Ambulatory Visit (INDEPENDENT_AMBULATORY_CARE_PROVIDER_SITE_OTHER): Payer: Medicare Other | Admitting: Psychiatry

## 2021-04-09 VITALS — BP 143/78 | HR 55 | Ht 61.0 in | Wt 121.0 lb

## 2021-04-09 DIAGNOSIS — R413 Other amnesia: Secondary | ICD-10-CM | POA: Diagnosis not present

## 2021-05-08 ENCOUNTER — Ambulatory Visit (HOSPITAL_COMMUNITY)
Admission: RE | Admit: 2021-05-08 | Discharge: 2021-05-08 | Disposition: A | Discharge status: Home / Self Care | Payer: Medicare Other | Source: Ambulatory Visit | Attending: Occupational Therapy | Admitting: Occupational Therapy

## 2021-05-08 DIAGNOSIS — Z1231 Encounter for screening mammogram for malignant neoplasm of breast: Secondary | ICD-10-CM | POA: Diagnosis present

## 2021-05-09 ENCOUNTER — Other Ambulatory Visit (HOSPITAL_COMMUNITY): Payer: Self-pay | Admitting: Family Medicine

## 2021-05-09 DIAGNOSIS — R928 Other abnormal and inconclusive findings on diagnostic imaging of breast: Secondary | ICD-10-CM

## 2021-05-13 ENCOUNTER — Ambulatory Visit (INDEPENDENT_AMBULATORY_CARE_PROVIDER_SITE_OTHER): Payer: Medicare Other | Admitting: Nurse Practitioner

## 2021-05-13 ENCOUNTER — Encounter: Payer: Self-pay | Admitting: Nurse Practitioner

## 2021-05-13 VITALS — BP 131/66 | HR 55 | Temp 97.1°F | Resp 20 | Ht 61.0 in | Wt 120.0 lb

## 2021-05-13 DIAGNOSIS — S70361A Insect bite (nonvenomous), right thigh, initial encounter: Secondary | ICD-10-CM | POA: Diagnosis not present

## 2021-05-13 DIAGNOSIS — W57XXXA Bitten or stung by nonvenomous insect and other nonvenomous arthropods, initial encounter: Secondary | ICD-10-CM | POA: Diagnosis not present

## 2021-05-13 DIAGNOSIS — S0006XA Insect bite (nonvenomous) of scalp, initial encounter: Secondary | ICD-10-CM

## 2021-05-13 MED ORDER — DOXYCYCLINE HYCLATE 200 MG PO TBEC: 200.0000 mg | DELAYED_RELEASE_TABLET | Freq: Once | ORAL | 0 refills | Status: AC

## 2021-05-13 NOTE — Patient Instructions (Signed)
Tick Bite Information, Adult  Ticks are insects that can bite. Most ticks live in shrubs and grassy areas. They climb onto people and animals that go by. Then they bite. Some ticks carry germs that can make you sick. How can I prevent tick bites? Take these steps: Use insect repellent Use an insect repellent that has 20% or higher of the ingredients DEET, picaridin, or IR3535. Follow the instructions on the label. Put it on: Bare skin. The tops of your boots. Your pant legs. The ends of your sleeves. If you use an insect repellent that has the ingredient permethrin, follow the instructions on the label. Put it on: Clothing. Boots. Supplies or outdoor gear. Tents. When you are outside Wear long sleeves and long pants. Wear light-colored clothes. Tuck your pant legs into your socks. Stay in the middle of the trail. Do not touch the bushes. Avoid walking through long grass. Check for ticks on your clothes, hair, and skin often while you are outside. Before going inside your house, check your clothes, skin, head, neck, armpits, waist, groin, and joint areas. When you go indoors Check your clothes for ticks. Dry your clothes in a dryer on high heat for 10 minutes or more. If clothes are damp, additional time may be needed. Wash your clothes right away if they need to be washed. Use hot water. Check your pets and outdoor gear. Shower right away. Check your body for ticks. Do a full body check using a mirror. What is the right way to remove a tick? Remove the tick from your skin as soon as possible. Do not remove the tick with your bare fingers. To remove a tick that is crawling on your skin: Go outdoors and brush the tick off. Use tape or a lint roller. To remove a tick that is biting: Wash your hands. If you have latex gloves, put them on. Use tweezers, curved forceps, or a tick-removal tool to grasp the tick. Grasp the tick as close to your skin and as close to the tick's head as  possible. Gently pull up until the tick lets go. Try to keep the tick's head attached to its body. Do not twist or jerk the tick. Do not squeeze or crush the tick. Do not try to remove a tick with heat, alcohol, petroleum jelly, or fingernail polish. What should I do after taking out a tick? Throw away the tick. Do not crush a tick with your fingers. Clean the bite area and your hands with soap and water, rubbing alcohol, or an iodine wash. If an antiseptic cream or ointment is available, apply a small amount to the bite area. Wash and disinfect any instruments that you used to remove the tick. How should I get rid of a live tick? To dispose of a live tick, use one of these methods: Place the tick in rubbing alcohol. Place the tick in a bag or container you can close tightly. Wrap the tick tightly in tape. Flush the tick down the toilet. Contact a doctor if: You have symptoms, such as: A fever or chills. A red rash that makes a circle (bull's-eye rash) in the bite area. Redness and swelling where the tick bit you. Headache. Pain in a muscle, joint, or bone. Being more tired than normal. Trouble walking or moving your legs. Numbness in your legs. Tender and swollen lymph glands. A part of a tick breaks off and gets stuck in your skin. Get help right away if: You cannot remove   a tick. You cannot move (have paralysis) or feel weak. You are feeling worse or have new symptoms. You find a tick that is biting you and filled with blood. This is important if you are in an area where diseases from ticks are common. Summary Ticks may carry germs that can make you sick. To prevent tick bites wear long sleeves, long pants, and light colors. Use insect repellent. Follow the instructions on the label. If the tick is biting, do not try to remove it with heat, alcohol, petroleum jelly, or fingernail polish. Use tweezers, curved forceps, or a tick-removal tool to grasp the tick. Gently pull up  until the tick lets go. Do not twist or jerk the tick. Do not squeeze or crush the tick. If you have symptoms, contact a doctor. This information is not intended to replace advice given to you by your health care provider. Make sure you discuss any questions you have with your health care provider. Document Revised: 12/27/2018 Document Reviewed: 12/27/2018 Elsevier Patient Education  2023 [employer]  

## 2021-05-13 NOTE — Progress Notes (Signed)
? ?Acute Office Visit ? ?Subjective:  ? ?  ?Patient ID: Denise Benton, female    DOB: March 08, 1950, 71 y.o.   MRN: [id number] ? ?Chief Complaint  ?Patient presents with  ? tike bite left leg   ? ? ?HPI ?Patient is in today for right buttock occurred 48 hours ago.  Patient is reporting slight tenderness, redness and rash.  No fever, nausea, vomiting, headache or flulike symptoms associated with current complaints.  Patient unaware of how long tick has been stuck in skin.  Patient was able to pull out cheek with a tweezer after wiping the area with alcohol.  Patient did send tick to lab for evaluation of Lyme disease. ? ?Review of Systems  ?HENT: Negative.    ?Eyes: Negative.   ?Respiratory: Negative.    ?Cardiovascular: Negative.   ?Genitourinary: Negative.   ?Musculoskeletal: Negative.   ?Skin:  Positive for itching and rash.  ?Neurological: Negative.   ?All other systems reviewed and are negative. ? ? ?   ?Objective:  ?  ?BP 131/66   Pulse (!) 55   Temp (!) 97.1 ?F (36.2 ?C)   Resp 20   Ht 5\' 1"  (1.549 m)   Wt 120 lb (54.4 kg)   SpO2 100%   BMI 22.67 kg/m?  ?BP Readings from Last 3 Encounters:  ?05/13/21 131/66  ?04/09/21 (!) 143/78  ?03/07/21 107/74  ? ?Wt Readings from Last 3 Encounters:  ?05/13/21 120 lb (54.4 kg)  ?04/09/21 121 lb (54.9 kg)  ?03/07/21 118 lb 12.8 oz (53.9 kg)  ? ?  ? ?Physical Exam ?Vitals and nursing note reviewed.  ?Constitutional:   ?   Appearance: Normal appearance.  ?HENT:  ?   Head: Normocephalic.  ?   Right Ear: External ear normal.  ?   Left Ear: External ear normal.  ?   Nose: Nose normal.  ?Eyes:  ?   Conjunctiva/sclera: Conjunctivae normal.  ?Cardiovascular:  ?   Rate and Rhythm: Normal rate and regular rhythm.  ?   Pulses: Normal pulses.  ?   Heart sounds: Normal heart sounds.  ?Pulmonary:  ?   Effort: Pulmonary effort is normal.  ?   Breath sounds: Normal breath sounds.  ?Abdominal:  ?   General: Bowel sounds are normal.  ?Musculoskeletal:     ?   General: Normal range of  motion.  ?Skin: ?   General: Skin is warm.  ?   Findings: Rash present.  ?Neurological:  ?   Mental Status: She is alert and oriented to person, place, and time.  ?Psychiatric:     ?   Mood and Affect: Mood normal.     ?   Behavior: Behavior normal.  ? ? ?No results found for any visits on 05/13/21. ? ? ?   ?Assessment & Plan:  ?Tick bite rash not well controlled.  Completed assessment, started patient on doxycycline 200 mg tablet by mouth once.  Lyme ABS labs for future completed.  Patient knows to return with flulike symptoms.  Education/printed handouts given. ? ? ?Problem List Items Addressed This Visit   ?None ?Visit Diagnoses   ? ? Tick bite of scalp, initial encounter    -  Primary  ? Relevant Medications  ? Doxycycline Hyclate 200 MG TBEC  ? Other Relevant Orders  ? Lyme Disease Serology w/Reflex  ? ?  ? ? ?Meds ordered this encounter  ?Medications  ? Doxycycline Hyclate 200 MG TBEC  ?  Sig: Take 200 mg by mouth  once for 1 dose.  ?  Dispense:  1 tablet  ?  Refill:  0  ?  Order Specific Question:   Supervising Provider  ?  AnswerMechele Claude [[id number]]  ? ? ?Return if symptoms worsen or fail to improve. ? ?Daryll Drown, NP ? ? ?

## 2021-05-14 ENCOUNTER — Telehealth: Payer: Self-pay

## 2021-05-14 DIAGNOSIS — S0006XA Insect bite (nonvenomous) of scalp, initial encounter: Secondary | ICD-10-CM

## 2021-05-14 NOTE — Telephone Encounter (Signed)
Pt wants to be called with update on PA asap. ?

## 2021-05-15 ENCOUNTER — Ambulatory Visit (HOSPITAL_COMMUNITY)
Admission: RE | Admit: 2021-05-15 | Discharge: 2021-05-15 | Disposition: A | Discharge status: Home / Self Care | Payer: Medicare Other | Source: Ambulatory Visit | Attending: Family Medicine | Admitting: Family Medicine

## 2021-05-15 DIAGNOSIS — R928 Other abnormal and inconclusive findings on diagnostic imaging of breast: Secondary | ICD-10-CM

## 2021-05-15 MED ORDER — DOXYCYCLINE HYCLATE 100 MG PO TABS: 100.0000 mg | ORAL_TABLET | Freq: Two times a day (BID) | ORAL | 0 refills | Status: AC

## 2021-05-15 NOTE — Telephone Encounter (Signed)
Sent in with different sig - pt aware ?

## 2021-05-15 NOTE — Telephone Encounter (Signed)
Doxy was sent 05/13/2021, it was a one time dose and will not be on the medication list. If patient did not get medication and wants it resent I can do that. If 200 mg is not available , I can send 100 mg  2 tablet to be taken  2 times  in 24 hours. Let me know how I can help further.  ?

## 2021-05-20 ENCOUNTER — Ambulatory Visit (INDEPENDENT_AMBULATORY_CARE_PROVIDER_SITE_OTHER): Payer: Medicare Other | Admitting: Nurse Practitioner

## 2021-05-20 ENCOUNTER — Encounter: Payer: Self-pay | Admitting: Nurse Practitioner

## 2021-05-20 VITALS — BP 96/58 | HR 61 | Temp 98.6°F | Ht 61.0 in | Wt 119.2 lb

## 2021-05-20 DIAGNOSIS — J0110 Acute frontal sinusitis, unspecified: Secondary | ICD-10-CM

## 2021-05-20 MED ORDER — AMOXICILLIN-POT CLAVULANATE 875-125 MG PO TABS: 1.0000 | ORAL_TABLET | Freq: Two times a day (BID) | ORAL | 0 refills | Status: DC

## 2021-05-20 MED ORDER — BENZONATATE 100 MG PO CAPS: 100.0000 mg | ORAL_CAPSULE | Freq: Three times a day (TID) | ORAL | 0 refills | Status: DC | PRN

## 2021-05-20 NOTE — Patient Instructions (Signed)

## 2021-05-20 NOTE — Progress Notes (Signed)
? ?Acute Office Visit ? ?Subjective:  ? ?  ?Patient ID: Denise Benton, female    DOB: 07/04/1950, 71 y.o.   MRN: [id number] ? ?Chief Complaint  ?Patient presents with  ? Sinus Problem  ?  Has been going on for about a week   ? Cough  ?  Going on for a week now  ? Nasal Congestion  ?  Very stopped up , getting worse   ? Chest Pain  ?  Hurts to cough   ? ? ?Cough ?This is a new problem. The current episode started in the past 7 days. The problem has been unchanged. The problem occurs constantly. The cough is Non-productive. Associated symptoms include a fever. Pertinent negatives include no ear congestion, ear pain or sore throat. She has tried OTC cough suppressant and a beta-agonist inhaler for the symptoms. The treatment provided no relief.  ?Sinusitis ?This is a new problem. The current episode started in the past 7 days. The problem has been gradually worsening since onset. There has been no fever. The pain is moderate. Associated symptoms include congestion, coughing, sinus pressure and swollen glands. Pertinent negatives include no ear pain, hoarse voice or sore throat. Past treatments include oral decongestants and acetaminophen. The treatment provided no relief.  ? ? ?Review of Systems  ?Constitutional:  Positive for fever.  ?HENT:  Positive for congestion and sinus pressure. Negative for ear pain, hoarse voice and sore throat.   ?Respiratory:  Positive for cough.   ?Cardiovascular: Negative.   ?All other systems reviewed and are negative. ? ? ?   ?Objective:  ?  ?BP (!) 96/58   Pulse 61   Temp 98.6 ?F (37 ?C)   Ht 5\' 1"  (1.549 m)   Wt 119 lb 3.2 oz (54.1 kg)   SpO2 98%   BMI 22.52 kg/m?  ?BP Readings from Last 3 Encounters:  ?05/20/21 (!) 96/58  ?05/13/21 131/66  ?04/09/21 (!) 143/78  ? ?Wt Readings from Last 3 Encounters:  ?05/20/21 119 lb 3.2 oz (54.1 kg)  ?05/13/21 120 lb (54.4 kg)  ?04/09/21 121 lb (54.9 kg)  ? ?  ? ?Physical Exam ?Vitals and nursing note reviewed.  ?Constitutional:   ?    Appearance: She is well-developed.  ?HENT:  ?   Head: Normocephalic.  ?   Nose: Congestion present.  ?   Mouth/Throat:  ?   Mouth: Mucous membranes are moist.  ?   Pharynx: Oropharynx is clear.  ?Eyes:  ?   Conjunctiva/sclera: Conjunctivae normal.  ?Cardiovascular:  ?   Rate and Rhythm: Normal rate and regular rhythm.  ?   Heart sounds: Murmur heard.  ?Diastolic murmur is present.  ?Pulmonary:  ?   Effort: Pulmonary effort is normal.  ?   Breath sounds: Normal breath sounds.  ?Abdominal:  ?   General: Bowel sounds are normal.  ?Musculoskeletal:     ?   General: Normal range of motion.  ?   Cervical back: Normal range of motion.  ?Skin: ?   General: Skin is warm.  ?   Findings: No rash.  ?Neurological:  ?   Mental Status: She is alert and oriented to person, place, and time.  ? ? ?No results found for any visits on 05/20/21. ? ? ?   ?Assessment & Plan:  ?Take meds as prescribed ?- Use a cool mist humidifier  ?-Use saline nose sprays frequently ?-Force fluids ?-For fever or aches or pains- take Tylenol or ibuprofen. ?-Tessalon pearls for cough ?-  Augmentin 875-125 mg tablet by mouth as prescribed ?-If symptoms do not improve, she may need to be COVID tested to rule this out ?Follow up with worsening unresolved symptoms  ?Problem List Items Addressed This Visit   ?None ?Visit Diagnoses   ? ? Subacute frontal sinusitis    -  Primary  ? Relevant Medications  ? amoxicillin-clavulanate (AUGMENTIN) 875-125 MG tablet  ? benzonatate (TESSALON PERLES) 100 MG capsule  ? ?  ? ? ?Meds ordered this encounter  ?Medications  ? amoxicillin-clavulanate (AUGMENTIN) 875-125 MG tablet  ?  Sig: Take 1 tablet by mouth 2 (two) times daily.  ?  Dispense:  14 tablet  ?  Refill:  0  ?  Order Specific Question:   Supervising Provider  ?  AnswerMechele Claude [[id number]]  ? benzonatate (TESSALON PERLES) 100 MG capsule  ?  Sig: Take 1 capsule (100 mg total) by mouth 3 (three) times daily as needed.  ?  Dispense:  20 capsule  ?  Refill:  0  ?   Order Specific Question:   Supervising Provider  ?  AnswerMechele Claude [[id number]]  ? ? ?Return if symptoms worsen or fail to improve. ? ?Daryll Drown, NP ? ? ?

## 2021-05-21 ENCOUNTER — Encounter: Payer: Self-pay | Admitting: Neurology

## 2021-05-21 ENCOUNTER — Ambulatory Visit (INDEPENDENT_AMBULATORY_CARE_PROVIDER_SITE_OTHER): Payer: Medicare Other | Admitting: Neurology

## 2021-05-21 VITALS — BP 126/74 | HR 61 | Ht 61.0 in | Wt 120.0 lb

## 2021-05-21 DIAGNOSIS — R0683 Snoring: Secondary | ICD-10-CM | POA: Diagnosis not present

## 2021-05-21 DIAGNOSIS — G4719 Other hypersomnia: Secondary | ICD-10-CM | POA: Diagnosis not present

## 2021-05-21 DIAGNOSIS — R419 Unspecified symptoms and signs involving cognitive functions and awareness: Secondary | ICD-10-CM

## 2021-05-21 DIAGNOSIS — R0689 Other abnormalities of breathing: Secondary | ICD-10-CM

## 2021-05-21 DIAGNOSIS — R519 Headache, unspecified: Secondary | ICD-10-CM | POA: Diagnosis not present

## 2021-05-21 NOTE — Patient Instructions (Signed)

## 2021-05-21 NOTE — Progress Notes (Signed)
Subjective:  ?  ?Patient ID: Denise Benton is a 71 y.o. female. ? ?HPI ? ? ? ?Denise Foley, MD, PhD ?[facility] ?[address], [address] ?[phone] ?[place], [place] [address] ? ?Dear Denise Benton,  ? ?I saw your patient, Denise Benton, upon your kind request in my sleep clinic today for initial consultation of her sleep disorder, in particular, concern for underlying obstructive sleep apnea.  The patient is unaccompanied today.  As you know, Denise Benton is a 71 year old right-handed woman with an underlying medical history of allergies, asthma, arthritis, reflux disease, memory loss, recurrent headaches, hyperlipidemia, irritable bowel syndrome, history of osteopenia, Hx of Covid, and kidney stones, who reports snoring and excessive daytime somnolence, difficulty maintaining sleep.  She has no trouble falling asleep but has had difficulty maintaining sleep for years.  Her husband has sleep apnea and uses a CPAP machine.  She has woken up with a headache occasionally, she does not have night to night nocturia.  She is not aware of any family history of sleep apnea.  She has had a feeling of gasping at night and feels like she may not get enough air at night.  I reviewed your office notes from 12/31/2020 as well as 04/09/2021.  Her Epworth sleepiness score is 11 out of 24, fatigue severity score is 8 out of 63.  She lives with her husband.  She works nights as a caregiver currently, she is a retired [name] fundraiser.  She has also worked swing shift at [employer].  She currently works at [employer] and works typically 3 nights a week, anywhere from 4 to 12 hours.  Bedtime when she works nights is around 8 AM and she may be up at 11:30 AM and may take a nap for 1 to 2 hours before going into work.  Bedtime when she is off it is around 11:30 PM and rise time around 8 AM but she never sleeps through the night.  She may wake up to 3 times.  She quit smoking many years ago, she does not drink any alcohol  currently, she drinks caffeine in the form of tea, about 2 servings per day.  She has had cognitive complaints.  She feels foggy headed. ? ?Her Past Medical History Is Significant For: ?Past Medical History:  ?Diagnosis Date  ? Allergy   ? seasonal  ? Arthritis   ? Asthma   ? Duodenal ulcer   ? Gallstones   ? GERD (gastroesophageal reflux disease)   ? HLD (hyperlipidemia)   ? IBS (irritable bowel syndrome)   ? Kidney stones   ? Lumbosacral disc disease   ? L4-5, L5-S1  ? Osteopenia   ? ? ?Her Past Surgical History Is Significant For: ?Past Surgical History:  ?Procedure Laterality Date  ? BREAST CYST ASPIRATION Right   ? pt has cysts aspiration done for lumps  ? CESAREAN SECTION  1980  ? x2; again in 1984  ? LAPAROSCOPIC CHOLECYSTECTOMY  2000  ? SKIN LESION EXCISION  07/2011  ? from scalp - keratosis  ? ? ?Her Family History Is Significant For: ?Family History  ?Problem Relation Age of Onset  ? Tremor Mother   ? Hyperlipidemia Mother   ? Skin cancer Mother   ? Dementia Mother   ? Congestive Heart Failure Father   ? Diabetes Father   ? Heart disease Father   ?     congestive heart failure  ? Multiple sclerosis Sister   ? Heart disease  Sister   ? Kidney Stones Sister   ? Tremor Sister   ? Diabetes Paternal Grandfather   ? Hypertension Daughter   ? Anxiety disorder Daughter   ? Colon cancer Neg Hx   ? Esophageal cancer Neg Hx   ? Stomach cancer Neg Hx   ? Rectal cancer Neg Hx   ? Inflammatory bowel disease Neg Hx   ? Liver disease Neg Hx   ? Pancreatic cancer Neg Hx   ? Sleep apnea Neg Hx   ? ? ?Her Social History Is Significant For: ?Social History  ? ?Socioeconomic History  ? Marital status: Married  ?  Spouse name: Denise Benton   ? Number of children: 2  ? Years of education: 4 years college  ? Highest education level: Bachelor's degree (e.g., BA, AB, BS)  ?Occupational History  ? Occupation: Retired RN/NP  ? 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 Smoking status: Former  ?   Packs/day: 0.50  ?  Years: 1.00  ?  Pack years: 0.50  ?  Types: Cigarettes  ?  Quit date: 01/13/1969  ?  Years since quitting: 52.3  ? Smokeless tobacco: Never  ?Vaping Use  ? Vaping Use: Never used  ?Substance and Sexual Activity  ? Alcohol use: No  ? Drug use: No  ? Sexual activity: Not Currently  ?Other Topics Concern  ? Not on file  ?Social History Narrative  ? Lives home with husband - daughter in [place]   ? Moved here from [place] in 2020  ? ?Social Determinants of Health  ? ?Financial Resource Strain: Low Risk   ? Difficulty of Paying Living Expenses: Not hard at all  ?Food Insecurity: No Food Insecurity  ? Worried About Programme researcher, broadcasting/film/video in the Last Year: Never true  ? Ran Out of Food in the Last Year: Never true  ?Transportation Needs: No Transportation Needs  ? Lack of Transportation (Medical): No  ? Lack of Transportation (Non-Medical): No  ?Physical Activity: Sufficiently Active  ? Days of Exercise per Week: 7 days  ? Minutes of Exercise per Session: 30 min  ?Stress: No Stress Concern Present  ? Feeling of Stress : Not at all  ?Social Connections: Moderately Integrated  ? Frequency of Communication with Friends and Family: More than three times a week  ? Frequency of Social Gatherings with Friends and Family: More than three times a week  ? Attends Religious Services: 1 to 4 times per year  ? Active Member of Clubs or Organizations: No  ? Attends Banker Meetings: Never  ? Marital Status: Married  ? ? ?Her Allergies Are:  ?Allergies  ?Allergen Reactions  ? Ivp Dye [Iodinated Contrast Media] Other (See Comments)  ?  Respiratory distress  ? Covid-19 (Mrna) Vaccine   ?  Flashing lights in visit, headaches, arm numbness, inability to move ?Flashing lights in visit, headaches, arm numbness, inability to move  ? Codeine Nausea Only  ? Niaspan [Niacin Er] Rash and Other (See Comments)  ?  Nightmares  ?:  ? ?Her Current Medications Are:  ?Outpatient Encounter Medications as of 05/21/2021   ?Medication Sig  ? acetaminophen (TYLENOL) 325 MG tablet Take 325 mg by mouth as needed.  ? Alum & Mag Hydroxide-Simeth (MYLANTA PO) Take by mouth as needed.   ? amoxicillin-clavulanate (AUGMENTIN) 875-125 MG tablet Take 1 tablet by mouth 2 (two) times daily.  ? B Complex Vitamins (VITAMIN-B COMPLEX PO) Take 1  tablet by mouth daily.  ? benzonatate (TESSALON PERLES) 100 MG capsule Take 1 capsule (100 mg total) by mouth 3 (three) times daily as needed.  ? Biotin 1610910000 MCG TABS Take by mouth daily.  ? Calcium Carbonate (CALTRATE 600 PO) Take 1 tablet by mouth. Bi weekly  ? chlorhexidine (PERIDEX) 0.12 % solution SMARTSIG:15 By Mouth Morning-Evening  ? Cholecalciferol (VITAMIN D) 50 MCG (2000 UT) CAPS Take by mouth daily.   ? Docosahexaenoic Acid (DHA PO) Take 1 tablet by mouth once a week.   ? ELDERBERRY PO Take 1 Dose by mouth as needed.  ? fluticasone-salmeterol (ADVAIR HFA) 115-21 MCG/ACT inhaler Inhale 2 puffs into the lungs 2 (two) times daily. As needed  ? folic acid (FOLVITE) 800 MCG tablet Take 400 mcg by mouth once a week.   ? ibuprofen (ADVIL) 600 MG tablet   ? levOCARNitine-E-Coenzyme Q10 (CO Q-10 PLUS L-CARNITINE PO) Take 1 tablet by mouth daily.  ? montelukast (SINGULAIR) 10 MG tablet   ? Multiple Vitamins-Minerals (EMERGEN-C IMMUNE PO) Take 1 tablet by mouth daily.  ? Omega-3 Fatty Acids (EPA PO) Take 1 tablet by mouth 3 (three) times a week.   ? omeprazole (PRILOSEC OTC) 20 MG tablet Take 20 mg by mouth daily.  ? albuterol (VENTOLIN HFA) 108 (90 Base) MCG/ACT inhaler Inhale into the lungs.  ? ?No facility-administered encounter medications on file as of 05/21/2021.  ?: ? ? ?Review of Systems:  ?Out of a complete 14 point review of systems, all are reviewed and negative with the exception of these symptoms as listed below: ? ?Review of Systems  ?Neurological:   ?     Pt is here for sleep consult   Pt states she snores,headaches,fatigue . Pt denies hypertension ,sleep study , CPAP machine ? ?ESS;11 ?FSS:38   ? ?Objective:  ?Neurological Exam ? ?Physical Exam ?Physical Examination:  ? ?Vitals:  ? 05/21/21 1132  ?BP: 126/74  ?Pulse: 61  ? ? ?General Examination: The patient is a very pleasant 71 y.o. female in no ac

## 2021-05-30 ENCOUNTER — Telehealth: Payer: Self-pay | Admitting: Neurology

## 2021-05-30 NOTE — Telephone Encounter (Signed)
LVM for pt to call back to schedule  medicare/tricare no auth req

## 2021-06-05 NOTE — Telephone Encounter (Signed)
Patient called back she is scheduled for 06/23/21 at 9 PM at [facility].

## 2021-06-12 ENCOUNTER — Ambulatory Visit (INDEPENDENT_AMBULATORY_CARE_PROVIDER_SITE_OTHER): Payer: Medicare Other

## 2021-06-12 VITALS — Wt 120.0 lb

## 2021-06-12 DIAGNOSIS — Z0000 Encounter for general adult medical examination without abnormal findings: Secondary | ICD-10-CM | POA: Diagnosis not present

## 2021-06-12 NOTE — Progress Notes (Signed)
Subjective:   Denise Benton is a 71 y.o. female who presents for Medicare Annual (Subsequent) preventive examination.  Virtual Visit via Telephone Note  I connected with  [name] on 06/12/21 at  9:45 AM EDT by telephone and verified that I am speaking with the correct person using two identifiers.  Location: Patient: Home Provider: [facility] Persons participating in the virtual visit: patient/Nurse Health Advisor   I discussed the limitations, risks, security and privacy concerns of performing an evaluation and management service by telephone and the availability of in person appointments. The patient expressed understanding and agreed to proceed.  Interactive audio and video telecommunications were attempted between this nurse and patient, however failed, due to patient having technical difficulties OR patient did not have access to video capability.  We continued and completed visit with audio only.  Some vital signs may be absent or patient reported.   Illyana Schorsch E Suliman Termini, LPN   Review of Systems     Cardiac Risk Factors include: advanced age (>44men, >52 women);dyslipidemia     Objective:    Today's Vitals   06/12/21 0950 06/12/21 0951  Weight: 120 lb (54.4 kg)   PainSc:  5    Body mass index is 22.67 kg/m.     06/12/2021    9:54 AM 01/12/2021    7:24 PM 06/08/2020    9:07 AM 04/05/2019    9:39 AM  Advanced Directives  Does Patient Have a Medical Advance Directive? Yes No Yes Yes  Type of Paramedic of [place];Living will  [place];Living will [place];Living will  Does patient want to make changes to medical advance directive?    No - Patient declined  Copy of [place] in Chart? No - copy requested  No - copy requested No - copy requested  Would patient like information on creating a medical advance directive?  Yes (ED - Information included in AVS)      Current Medications  (verified) Outpatient Encounter Medications as of 06/12/2021  Medication Sig   acetaminophen (TYLENOL) 325 MG tablet Take 325 mg by mouth as needed.   Alum & Mag Hydroxide-Simeth (MYLANTA PO) Take by mouth as needed.    B Complex Vitamins (VITAMIN-B COMPLEX PO) Take 1 tablet by mouth daily.   Biotin 10000 MCG TABS Take by mouth daily.   Calcium Carbonate (CALTRATE 600 PO) Take 1 tablet by mouth. Bi weekly   chlorhexidine (PERIDEX) 0.12 % solution SMARTSIG:15 By Mouth Morning-Evening   Cholecalciferol (VITAMIN D) 50 MCG (2000 UT) CAPS Take by mouth daily.    Docosahexaenoic Acid (DHA PO) Take 1 tablet by mouth once a week.    ELDERBERRY PO Take 1 Dose by mouth as needed.   fluticasone-salmeterol (ADVAIR HFA) 115-21 MCG/ACT inhaler Inhale 2 puffs into the lungs 2 (two) times daily. As needed   folic acid (FOLVITE) [id number] MCG tablet Take 400 mcg by mouth once a week.    ibuprofen (ADVIL) 600 MG tablet    levOCARNitine-E-Coenzyme Q10 (CO Q-10 PLUS L-CARNITINE PO) Take 1 tablet by mouth daily.   montelukast (SINGULAIR) 10 MG tablet    Multiple Vitamins-Minerals (EMERGEN-C IMMUNE PO) Take 1 tablet by mouth daily.   Omega-3 Fatty Acids (EPA PO) Take 1 tablet by mouth 3 (three) times a week.    omeprazole (PRILOSEC OTC) 20 MG tablet Take 20 mg by mouth daily.   albuterol (VENTOLIN HFA) 108 (90 Base) MCG/ACT inhaler Inhale into the  lungs.   [DISCONTINUED] amoxicillin-clavulanate (AUGMENTIN) 875-125 MG tablet Take 1 tablet by mouth 2 (two) times daily. (Patient not taking: Reported on 06/12/2021)   [DISCONTINUED] benzonatate (TESSALON PERLES) 100 MG capsule Take 1 capsule (100 mg total) by mouth 3 (three) times daily as needed. (Patient not taking: Reported on 06/12/2021)   No facility-administered encounter medications on file as of 06/12/2021.    Allergies (verified) Ivp dye [iodinated contrast media], Covid-19 (mrna) vaccine, Codeine, and Niaspan [niacin er]   History: Past Medical History:   Diagnosis Date   Allergy    seasonal   Arthritis    Asthma    Duodenal ulcer    Gallstones    GERD (gastroesophageal reflux disease)    HLD (hyperlipidemia)    IBS (irritable bowel syndrome)    Kidney stones    Lumbosacral disc disease    L4-5, L5-S1   Osteopenia    Past Surgical History:  Procedure Laterality Date   BREAST CYST ASPIRATION Right    pt has cysts aspiration done for lumps   CESAREAN SECTION  1980   x2; again in [place]  07/2011   from scalp - keratosis   Family History  Problem Relation Age of Onset   Tremor Mother    Hyperlipidemia Mother    Skin cancer Mother    Dementia Mother    Congestive Heart Failure Father    Diabetes Father    Heart disease Father        congestive heart failure   Multiple sclerosis Sister    Heart disease Sister    Kidney Stones Sister    Tremor Sister    Diabetes Paternal Grandfather    Hypertension Daughter    Anxiety disorder Daughter    Colon cancer Neg Hx    Esophageal cancer Neg Hx    Stomach cancer Neg Hx    Rectal cancer Neg Hx    Inflammatory bowel disease Neg Hx    Liver disease Neg Hx    Pancreatic cancer Neg Hx    Sleep apnea Neg Hx    Social History   Socioeconomic History   Marital status: Married    Spouse name: Denise Benton    Number of children: 2   Years of education: 4 years college   Highest education level: Bachelor's degree (e.g., BA, AB, BS)  Occupational History   Occupation: Retired RN/NP   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  Tobacco Use   Smoking status: Former    Packs/day: 0.50    Years: 1.00    Pack years: 0.50    Types: Cigarettes    Quit date: 01/13/1969    Years since quitting: 52.4   Smokeless tobacco: Never  Vaping Use   Vaping Use: Never used  Substance and Sexual Activity   Alcohol use: No   Drug use: No   Sexual activity: Not Currently  Other Topics Concern   Not on file  Social  History Narrative   Lives home with husband - daughter in [place] here from [place] in 2020   Social Determinants of Health   Financial Resource Strain: Low Risk    Difficulty of Paying Living Expenses: Not hard at all  Food Insecurity: No Food Insecurity   Worried About [name] fundraiser in the Last Year: Never true   [place] in the Last Year: Never true  Transportation Needs:  No Transportation Needs   Lack of Transportation (Medical): No   Lack of Transportation (Non-Medical): No  Physical Activity: Sufficiently Active   Days of Exercise per Week: 5 days   Minutes of Exercise per Session: 30 min  Stress: No Stress Concern Present   Feeling of Stress : Not at all  Social Connections: Socially Integrated   Frequency of Communication with Friends and Family: More than three times a week   Frequency of Social Gatherings with Friends and Family: More than three times a week   Attends Religious Services: More than 4 times per year   Active Member of [employer] or Organizations: Yes   Attends Music therapist: More than 4 times per year   Marital Status: Married    Tobacco Counseling Counseling given: Not Answered   Clinical Intake:  Pre-visit preparation completed: Yes  Pain : 0-10 Pain Score: 5  Pain Type: Chronic pain Pain Location: Back Pain Descriptors / Indicators: Aching, Discomfort Pain Onset: More than a month ago Pain Frequency: Intermittent     BMI - recorded: 22.67 Nutritional Status: BMI of 19-24  Normal Nutritional Risks: None Diabetes: No  How often do you need to have someone help you when you read instructions, pamphlets, or other written materials from your doctor or pharmacy?: 1 - Never  Diabetic? no  Interpreter Needed?: No  Information entered by :: Desmen Schoffstall, LPN   Activities of Daily Living    06/12/2021   10:01 AM  In your present state of health, do you have any difficulty performing the following  activities:  Hearing? 0  Vision? 0  Difficulty concentrating or making decisions? 0  Walking or climbing stairs? 0  Dressing or bathing? 0  Doing errands, shopping? 0  Preparing Food and eating ? N  Using the Toilet? N  In the past six months, have you accidently leaked urine? N  Do you have problems with loss of bowel control? N  Managing your Medications? N  Managing your Finances? N  Housekeeping or managing your Housekeeping? N    Patient Care Team: Loman Brooklyn, FNP as PCP - General (Family Medicine) Noralyn Pick, NP as Nurse Practitioner (Gastroenterology) Sandi Carne, MD (Chiropractic Medicine) Tanda Rockers, MD as Consulting Physician (Pulmonary Disease) Rozetta Nunnery, MD (Inactive) as Consulting Physician (Otolaryngology) Star Age, MD as Attending Physician (Neurology) Franchot Gallo, MD as Consulting Physician (Urology)  Indicate any recent Medical Services you may have received from other than [facility] providers in the past year (date may be approximate).     Assessment:   This is a routine wellness examination for [place].  Hearing/Vision screen Hearing Screening - Comments:: Denies hearing difficulties   Vision Screening - Comments:: Wears rx glasses - behind with routine eye exams with [facility] in [place]  Dietary issues and exercise activities discussed: Current Exercise Habits: The patient has a physically strenuous job, but has no regular exercise apart from work., Exercise limited by: orthopedic condition(s)   Goals Addressed             This Visit's Progress    Patient Stated   On track    06/12/2021 AWV Goal: Improved Nutrition/Diet  Planning to see a personal trainer to get healthier by eating better and exercising - try to get back pain under control     COMPLETED: Patient Stated   On track    Take her boards for [employer]; stay active and healthy Get asthma under control  Depression Screen     06/12/2021   10:04 AM 05/20/2021    8:37 AM 05/13/2021   11:15 AM 03/07/2021    9:16 AM 01/17/2021   10:44 AM 12/25/2020   10:54 AM 12/04/2020    8:46 AM  PHQ 2/9 Scores  PHQ - 2 Score 0 1 0 0 0  0  PHQ- 9 Score 4 6  4 3  4   Exception Documentation      Other- indicate reason in comment box   Not completed      pt declined, just answered last month     Fall Risk    06/12/2021    9:51 AM 05/13/2021   11:15 AM 05/13/2021   11:09 AM 03/07/2021    9:16 AM 01/17/2021   10:44 AM  [place] in the past year? 0 0 0 1 1  Number falls in past yr: 0   1 1  Injury with Fall? 0   1 0  Risk for fall due to : Orthopedic patient      Follow up Falls prevention discussed   Falls prevention discussed Falls prevention discussed    FALL RISK PREVENTION PERTAINING TO THE HOME:  Any stairs in or around the home? Yes  If so, are there any without handrails? No  Home free of loose throw rugs in walkways, pet beds, electrical cords, etc? Yes  Adequate lighting in your home to reduce risk of falls? Yes   ASSISTIVE DEVICES UTILIZED TO PREVENT FALLS:  Life alert? No  Use of a cane, walker or w/c? No  Grab bars in the bathroom? Yes  Shower chair or bench in shower? Yes  Elevated toilet seat or a handicapped toilet? Yes   TIMED UP AND GO:  Was the test performed? No . Telephonic visit  Cognitive Function:    12/04/2020    8:46 AM  MMSE - Mini Mental State Exam  Orientation to time 4  Orientation to Place 5  Registration 3  Attention/ Calculation 5  Recall 2  Language- name 2 objects 2  Language- repeat 1  Language- follow 3 step command 3  Language- read & follow direction 1  Write a sentence 1  Copy design 1  Total score 28        06/12/2021   10:02 AM 06/08/2020    8:50 AM 04/05/2019    9:45 AM  6CIT Screen  What Year? 0 points 0 points 0 points  What month? 0 points 0 points 0 points  What time? 0 points 0 points 0 points  Count back from 20 0 points 0 points 0 points  Months  in reverse 0 points 0 points 0 points  Repeat phrase 0 points 2 points 0 points  Total Score 0 points 2 points 0 points    Immunizations Immunization History  Administered Date(s) Administered   Fluad Quad(high Dose 65+) 11/08/2019, 12/25/2020   Hepatitis A 11/09/2007   Hepatitis A, Adult 11/09/2007, 01/17/2021   IPV 11/09/2007   Influenza, High Dose Seasonal PF 11/08/2019   Influenza,inj,quad, With Preservative 11/14/2018   Influenza-Unspecified 11/08/2019   PFIZER(Purple Top)SARS-COV-2 Vaccination 09/23/2019   Pneumococcal Conjugate-13 11/18/2016   Pneumococcal Polysaccharide-23 10/25/2018   Td 01/13/2005   Tdap 10/24/2008, 12/25/2020   Typhoid Live 11/09/2007    TDAP status: Up to date  Flu Vaccine status: Up to date  Pneumococcal vaccine status: Up to date  Covid-19 vaccine status: Declined, Education has been provided regarding the importance of  this vaccine but patient still declined. Advised may receive this vaccine at local pharmacy or Health Dept.or vaccine clinic. Aware to provide a copy of the vaccination record if obtained from local pharmacy or Health Dept. Verbalized acceptance and understanding.  Qualifies for Shingles Vaccine? Yes   Zostavax completed No   Shingrix Completed?: No.    Education has been provided regarding the importance of this vaccine. Patient has been advised to call insurance company to determine out of pocket expense if they have not yet received this vaccine. Advised may also receive vaccine at local pharmacy or Health Dept. Verbalized acceptance and understanding.  Screening Tests Health Maintenance  Topic Date Due   Zoster Vaccines- Shingrix (1 of 2) Never done   COVID-19 Vaccine (2 - Pfizer risk series) 10/14/2019   COLONOSCOPY (Pts 45-80yrs Insurance coverage will need to be confirmed)  10/24/2020   INFLUENZA VACCINE  08/13/2021   DEXA SCAN  01/27/2022   MAMMOGRAM  05/09/2023   TETANUS/TDAP  12/26/2030   Pneumonia Vaccine 65+ Years  old  Completed   Hepatitis C Screening  Completed   HPV VACCINES  Aged Out    Health Maintenance  Health Maintenance Due  Topic Date Due   Zoster Vaccines- Shingrix (1 of 2) Never done   COVID-19 Vaccine (2 - Pfizer risk series) 10/14/2019   COLONOSCOPY (Pts 45-6yrs Insurance coverage will need to be confirmed)  10/24/2020    Colorectal cancer screening: Type of screening: Colonoscopy. Completed 10/25/2010. Repeat every 10 years - Declines at this time, but plans to make appt later this year  Mammogram status: Completed 05/08/2021. Repeat every year  Bone Density status: Completed 01/28/2019. Results reflect: Bone density results: OSTEOPENIA. Repeat every 3 years.  Lung Cancer Screening: (Low Dose CT Chest recommended if Age 23-80 years, 30 pack-year currently smoking OR have quit w/in 15years.) does not qualify.   Additional Screening:  Hepatitis C Screening: does qualify; Completed 03/07/2021  Vision Screening: Recommended annual ophthalmology exams for early detection of glaucoma and other disorders of the eye. Is the patient up to date with their annual eye exam?  No  Who is the provider or what is the name of the office in which the patient attends annual eye exams? [facility] in [place] If pt is not established with a provider, would they like to be referred to a provider to establish care? No .   Dental Screening: Recommended annual dental exams for proper oral hygiene  Community Resource Referral / Chronic Care Management: CRR required this visit?  No   CCM required this visit?  No      Plan:     I have personally reviewed and noted the following in the patient's chart:   Medical and social history Use of alcohol, tobacco or illicit drugs  Current medications and supplements including opioid prescriptions.  Functional ability and status Nutritional status Physical activity Advanced directives List of other physicians Hospitalizations, surgeries, and ER visits in  previous 12 months Vitals Screenings to include cognitive, depression, and falls Referrals and appointments  In addition, I have reviewed and discussed with patient certain preventive protocols, quality metrics, and best practice recommendations. A written personalized care plan for preventive services as well as general preventive health recommendations were provided to patient.     Sandrea Hammond, LPN   [id number]   Nurse Notes: None

## 2021-06-12 NOTE — Patient Instructions (Signed)
Denise Benton , Thank you for taking time to come for your Medicare Wellness Visit. I appreciate your ongoing commitment to your health goals. Please review the following plan we discussed and let me know if I can assist you in the future.   Screening recommendations/referrals: Colonoscopy: Done 10/25/2010 - Repeat in 10 years *this is due - make appointment soon Mammogram: Done 05/08/2021 - Repeat annually Bone Density:  Done 01/28/2019 - Repeat in 3 years  Recommended yearly ophthalmology/optometry visit for glaucoma screening and checkup Recommended yearly dental visit for hygiene and checkup  Vaccinations: Influenza vaccine: Done 12/25/2020 - Repeat annually Pneumococcal vaccine: Done 11/18/2016 & 10/25/2018 Tdap vaccine: Done 12/25/2020 - Repeat in 10 years Shingles vaccine: Due - Shingrix is 2 doses 2-6 months apart and over 90% effective     Covid-19: Done 09/23/2019 - declines boosters  Advanced directives: Please bring a copy of your health care power of attorney and living will to the office to be added to your chart at your convenience.   Conditions/risks identified: Aim for 30 minutes of exercise or brisk walking, 6-8 glasses of water, and 5 servings of fruits and vegetables each day.   Next appointment: Follow up in one year for your annual wellness visit    Preventive Care 65 Years and Older, Female Preventive care refers to lifestyle choices and visits with your health care provider that can promote health and wellness. What does preventive care include? A yearly physical exam. This is also called an annual well check. Dental exams once or twice a year. Routine eye exams. Ask your health care provider how often you should have your eyes checked. Personal lifestyle choices, including: Daily care of your teeth and gums. Regular physical activity. Eating a healthy diet. Avoiding tobacco and drug use. Limiting alcohol use. Practicing safe sex. Taking low-dose aspirin every  day. Taking vitamin and mineral supplements as recommended by your health care provider. What happens during an annual well check? The services and screenings done by your health care provider during your annual well check will depend on your age, overall health, lifestyle risk factors, and family history of disease. Counseling  Your health care provider may ask you questions about your: Alcohol use. Tobacco use. Drug use. Emotional well-being. Home and relationship well-being. Sexual activity. Eating habits. History of falls. Memory and ability to understand (cognition). Work and work Astronomer. Reproductive health. Screening  You may have the following tests or measurements: Height, weight, and BMI. Blood pressure. Lipid and cholesterol levels. These may be checked every 5 years, or more frequently if you are over 67 years old. Skin check. Lung cancer screening. You may have this screening every year starting at age 70 if you have a 30-pack-year history of smoking and currently smoke or have quit within the past 15 years. Fecal occult blood test (FOBT) of the stool. You may have this test every year starting at age 58. Flexible sigmoidoscopy or colonoscopy. You may have a sigmoidoscopy every 5 years or a colonoscopy every 10 years starting at age 35. Hepatitis C blood test. Hepatitis B blood test. Sexually transmitted disease (STD) testing. Diabetes screening. This is done by checking your blood sugar (glucose) after you have not eaten for a while (fasting). You may have this done every 1-3 years. Bone density scan. This is done to screen for osteoporosis. You may have this done starting at age 68. Mammogram. This may be done every 1-2 years. Talk to your health care provider about how often  you should have regular mammograms. Talk with your health care provider about your test results, treatment options, and if necessary, the need for more tests. Vaccines  Your health care  provider may recommend certain vaccines, such as: Influenza vaccine. This is recommended every year. Tetanus, diphtheria, and acellular pertussis (Tdap, Td) vaccine. You may need a Td booster every 10 years. Zoster vaccine. You may need this after age 63. Pneumococcal 13-valent conjugate (PCV13) vaccine. One dose is recommended after age 52. Pneumococcal polysaccharide (PPSV23) vaccine. One dose is recommended after age 58. Talk to your health care provider about which screenings and vaccines you need and how often you need them. This information is not intended to replace advice given to you by your health care provider. Make sure you discuss any questions you have with your health care provider. Document Released: 01/26/2015 Document Revised: 09/19/2015 Document Reviewed: 10/31/2014 Elsevier Interactive Patient Education  2017 [place] Prevention in the Home Falls can cause injuries. They can happen to people of all ages. There are many things you can do to make your home safe and to help prevent falls. What can I do on the outside of my home? Regularly fix the edges of walkways and driveways and fix any cracks. Remove anything that might make you trip as you walk through a door, such as a raised step or threshold. Trim any bushes or trees on the path to your home. Use bright outdoor lighting. Clear any walking paths of anything that might make someone trip, such as rocks or tools. Regularly check to see if handrails are loose or broken. Make sure that both sides of any steps have handrails. Any raised decks and porches should have guardrails on the edges. Have any leaves, snow, or ice cleared regularly. Use sand or salt on walking paths during winter. Clean up any spills in your garage right away. This includes oil or grease spills. What can I do in the bathroom? Use night lights. Install grab bars by the toilet and in the tub and shower. Do not use towel bars as grab  bars. Use non-skid mats or decals in the tub or shower. If you need to sit down in the shower, use a plastic, non-slip stool. Keep the floor dry. Clean up any water that spills on the floor as soon as it happens. Remove soap buildup in the tub or shower regularly. Attach bath mats securely with double-sided non-slip rug tape. Do not have throw rugs and other things on the floor that can make you trip. What can I do in the bedroom? Use night lights. Make sure that you have a light by your bed that is easy to reach. Do not use any sheets or blankets that are too big for your bed. They should not hang down onto the floor. Have a firm chair that has side arms. You can use this for support while you get dressed. Do not have throw rugs and other things on the floor that can make you trip. What can I do in the kitchen? Clean up any spills right away. Avoid walking on wet floors. Keep items that you use a lot in easy-to-reach places. If you need to reach something above you, use a strong step stool that has a grab bar. Keep electrical cords out of the way. Do not use floor polish or wax that makes floors slippery. If you must use wax, use non-skid floor wax. Do not have throw rugs and other things on  the floor that can make you trip. What can I do with my stairs? Do not leave any items on the stairs. Make sure that there are handrails on both sides of the stairs and use them. Fix handrails that are broken or loose. Make sure that handrails are as long as the stairways. Check any carpeting to make sure that it is firmly attached to the stairs. Fix any carpet that is loose or worn. Avoid having throw rugs at the top or bottom of the stairs. If you do have throw rugs, attach them to the floor with carpet tape. Make sure that you have a light switch at the top of the stairs and the bottom of the stairs. If you do not have them, ask someone to add them for you. What else can I do to help prevent  falls? Wear shoes that: Do not have high heels. Have rubber bottoms. Are comfortable and fit you well. Are closed at the toe. Do not wear sandals. If you use a stepladder: Make sure that it is fully opened. Do not climb a closed stepladder. Make sure that both sides of the stepladder are locked into place. Ask someone to hold it for you, if possible. Clearly mark and make sure that you can see: Any grab bars or handrails. First and last steps. Where the edge of each step is. Use tools that help you move around (mobility aids) if they are needed. These include: Canes. Walkers. Scooters. Crutches. Turn on the lights when you go into a dark area. Replace any light bulbs as soon as they burn out. Set up your furniture so you have a clear path. Avoid moving your furniture around. If any of your floors are uneven, fix them. If there are any pets around you, be aware of where they are. Review your medicines with your doctor. Some medicines can make you feel dizzy. This can increase your chance of falling. Ask your doctor what other things that you can do to help prevent falls. This information is not intended to replace advice given to you by your health care provider. Make sure you discuss any questions you have with your health care provider. Document Released: 10/26/2008 Document Revised: 06/07/2015 Document Reviewed: 02/03/2014 Elsevier Interactive Patient Education  2017 [employer].

## 2021-06-23 ENCOUNTER — Ambulatory Visit (INDEPENDENT_AMBULATORY_CARE_PROVIDER_SITE_OTHER): Payer: Medicare Other | Admitting: Neurology

## 2021-06-23 DIAGNOSIS — R0689 Other abnormalities of breathing: Secondary | ICD-10-CM

## 2021-06-23 DIAGNOSIS — R519 Headache, unspecified: Secondary | ICD-10-CM

## 2021-06-23 DIAGNOSIS — R0683 Snoring: Secondary | ICD-10-CM

## 2021-06-23 DIAGNOSIS — G4720 Circadian rhythm sleep disorder, unspecified type: Secondary | ICD-10-CM

## 2021-06-23 DIAGNOSIS — R419 Unspecified symptoms and signs involving cognitive functions and awareness: Secondary | ICD-10-CM

## 2021-06-23 DIAGNOSIS — G4719 Other hypersomnia: Secondary | ICD-10-CM

## 2021-06-28 NOTE — Procedures (Signed)
PATIENT'S NAME:  Denise Benton, Denise Benton DOB:      03/18/1950      MR#:    [id number]     DATE OF RECORDING: 06/23/2021 REFERRING M.D.:  Ocie Doyne, MD Study Performed:   Baseline Polysomnogram HISTORY: 71 year old woman with a history of allergies, asthma, arthritis, reflux disease, memory loss, recurrent headaches, hyperlipidemia, irritable bowel syndrome, history of osteopenia, Hx of Covid, and kidney stones, who reports snoring and excessive daytime somnolence, difficulty maintaining sleep. The patient endorsed the Epworth Sleepiness Scale at 11 points. The patient's weight 120 pounds with a height of 61 (inches), resulting in a BMI of 22.5 kg/m2.  The patient's neck circumference measured 12.5 inches.  CURRENT MEDICATIONS: Tylenol, Mylanta, Augmentin, Vitamin-B, Tessalon Perles, Biotin, Caltrate, Peridex, Vitamin D, DHA, Elderberry, Advair HFA, Folvite, Advil, CO Q-10, Singulair, Emergen-C, EPA, Prilosec, Ventolin HFA   PROCEDURE:  This is a multichannel digital polysomnogram utilizing the Somnostar 11.2 system.  Electrodes and sensors were applied and monitored per AASM Specifications.   EEG, EOG, Chin and Limb EMG, were sampled at 200 Hz.  ECG, Snore and Nasal Pressure, Thermal Airflow, Respiratory Effort, CPAP Flow and Pressure, Oximetry was sampled at 50 Hz. Digital video and audio were recorded.      BASELINE STUDY  Lights Out was at 21:48 and Lights On at 05:01.  Total recording time (TRT) was 433.5 minutes, with a total sleep time (TST) of 346.5 minutes.   The patient's sleep latency was 23.5 minutes.  REM latency was 56 minutes, which is mildly reduced. The sleep efficiency was 79.9 %.     SLEEP ARCHITECTURE: WASO (Wake after sleep onset) was 63.5 minutes with mild to moderate sleep fragmentation noted and one longer period of wakefulness. There were 3.5 minutes in Stage N1, 184.5 minutes Stage N2, 95 minutes Stage N3 and 63.5 minutes in Stage REM.  The percentage of Stage N1 was 1.%, Stage N2  was 53.2%, which is normal, Stage N3 was 27.4% and Stage R (REM sleep) was 18.3%, which is near-normal. The arousals were noted as: 36 were spontaneous, 0 were associated with PLMs, 4 were associated with respiratory events.  RESPIRATORY ANALYSIS:  There were a total of 12 respiratory events:  1 obstructive apneas, 0 central apneas and 0 mixed apneas with a total of 1 apneas and an apnea index (AI) of .2 /hour. There were 11 hypopneas with a hypopnea index of 1.9 /hour. The patient also had 0 respiratory event related arousals (RERAs).      The total APNEA/HYPOPNEA INDEX (AHI) was 2.1/hour and the total RESPIRATORY DISTURBANCE INDEX was  2.1 /hour.  7 events occurred in REM sleep and 10 events in NREM. The REM AHI was  6.6 /hour, versus a non-REM AHI of 1.1. The patient spent 149 minutes of total sleep time in the supine position and 198 minutes in non-supine.. The supine AHI was 4.0 versus a non-supine AHI of 0.6.  OXYGEN SATURATION & C02:  The Wake baseline 02 saturation was 96%, with the lowest being 83%. Time spent below 89% saturation equaled 2 minutes.  PERIODIC LIMB MOVEMENTS: The patient had a total of 0 Periodic Limb Movements.  The Periodic Limb Movement (PLM) index was 0 and the PLM Arousal index was 0/hour.  Audio and video analysis did not show any abnormal or unusual movements, behaviors, phonations or vocalizations. The patient took no bathroom breaks. No significant snoring was noted. The EKG was in keeping with normal sinus rhythm (NSR).  Post-study, the patient indicated  that sleep was the same as usual.   IMPRESSION:  Dysfunctions associated with sleep stages or arousal from sleep  RECOMMENDATIONS:  This study does not demonstrate any significant obstructive or central sleep disordered breathing with the exception of mild REM sleep related sleep apnea, noticed primarily during supine REM sleep. Treatment with a positive airway pressure device such as CPAP or autoPAP is not  warranted; avoidance of the supine sleep position may alleviate her mild REM stage related sleep disordered breathing.  This study shows sleep some fragmentation and mildly abnormal sleep stage percentages; these are nonspecific findings and per se do not signify an intrinsic sleep disorder or a cause for the patient's sleep-related symptoms. Causes include (but are not limited to) the first night effect of the sleep study, circadian rhythm disturbances, medication effect or an underlying mood disorder or medical problem.  The patient should be cautioned not to drive, work at heights, or operate dangerous or heavy equipment when tired or sleepy. Review and reiteration of good sleep hygiene measures should be pursued with any patient. The patient will be advised to follow up with the referring provider, who will be notified of the test results.  I certify that I have reviewed the entire raw data recording prior to the issuance of this report in accordance with the Standards of Accreditation of the American Academy of Sleep Medicine (AASM)  Huston Foley, MD, PhD Diplomat, American Board of Neurology and Sleep Medicine (Neurology and Sleep Medicine)

## 2021-07-01 ENCOUNTER — Telehealth: Payer: Self-pay | Admitting: *Deleted

## 2021-07-01 NOTE — Telephone Encounter (Signed)
Spoke with patient gave sleep results . Pt wanted to know how to stay away from supine position. Informed patient to start off on side when she falls asleep and if she happens  to wake up during the night on her back change positions to her side Pt expressed understanding  Thanked me for calling

## 2021-07-01 NOTE — Telephone Encounter (Signed)
-----   Message from Huston Foley, MD sent at 06/28/2021  1:21 PM EDT ----- Patient referred by Dr. Delena Bali, seen by me on 05/21/21, diagnostic PSG on 06/23/21.   Please call and notify the patient that the recent sleep study did not show any significant obstructive sleep apnea with the exception of mild REM sleep related sleep apnea, noticed primarily during supine REM sleep. Treatment with a positive airway pressure device such as CPAP or autoPAP is not warranted; avoidance of the supine sleep position may alleviate her mild REM stage related sleep disordered breathing. She can follow up with Dr. Delena Bali as scheduled/planned.  Thanks,  Huston Foley, MD, PhD [facility])

## 2021-08-14 ENCOUNTER — Encounter: Payer: Self-pay | Admitting: Family Medicine

## 2021-08-14 ENCOUNTER — Ambulatory Visit (INDEPENDENT_AMBULATORY_CARE_PROVIDER_SITE_OTHER): Payer: Medicare Other | Admitting: Family Medicine

## 2021-08-14 VITALS — BP 122/79 | HR 62 | Temp 97.3°F | Ht 61.0 in | Wt 120.8 lb

## 2021-08-14 DIAGNOSIS — S63641A Sprain of metacarpophalangeal joint of right thumb, initial encounter: Secondary | ICD-10-CM

## 2021-08-14 NOTE — Progress Notes (Signed)
Assessment & Plan:  1. Sprain of metacarpophalangeal (MCP) joint of right thumb, initial encounter Education provided on finger sprain. Thumb splint applied in office today. Encouraged rest, ice, splint, elevation, and protection. She does not wish to be written out of work at this time.   Follow up plan: Return if symptoms worsen or fail to improve.  [name], MSN, APRN, FNP-C [facility]  Subjective:   Patient ID: Denise Benton, female    DOB: [birth year], 71 y.o.   MRN: [id number]  HPI: Denise Benton is a 71 y.o. female presenting on 08/14/2021 for right thumb sprain  Patient reports she saw her chiropractor two days ago and was told she had a sprain of her right thumb and needed to follow-up with her PCP for ongoing management. 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 Pain is worse in the morning. It is swollen. She has not taken anything for pain.   ROS: Negative unless specifically indicated above in HPI.   Relevant past medical history reviewed and updated as indicated.   Allergies and medications reviewed and updated.   Current Outpatient Medications:    acetaminophen (TYLENOL) 325 MG tablet, Take 325 mg by mouth as needed., Disp: , Rfl:    Alum & Mag Hydroxide-Simeth (MYLANTA PO), Take by mouth as needed. , Disp: , Rfl:    B Complex Vitamins (VITAMIN-B COMPLEX PO), Take 1 tablet by mouth daily., Disp: , Rfl:    Biotin 67672 MCG TABS, Take by mouth daily., Disp: , Rfl:    Calcium Carbonate (CALTRATE 600 PO), Take 1 tablet by mouth. Bi weekly, Disp: , Rfl:    chlorhexidine (PERIDEX) 0.12 % solution, SMARTSIG:15 By Mouth Morning-Evening, Disp: , Rfl:    Cholecalciferol (VITAMIN D) 50 MCG (2000 UT) CAPS, Take by mouth daily. , Disp: , Rfl:    Docosahexaenoic Acid (DHA PO), Take 1 tablet by mouth once a week. , Disp: , Rfl:    ELDERBERRY PO, Take 1 Dose by mouth as needed.,  Disp: , Rfl:    fluticasone-salmeterol (ADVAIR HFA) 115-21 MCG/ACT inhaler, Inhale 2 puffs into the lungs 2 (two) times daily. As needed, Disp: , Rfl:    folic acid (FOLVITE) 800 MCG tablet, Take 400 mcg by mouth once a week. , Disp: , Rfl:    ibuprofen (ADVIL) 600 MG tablet, , Disp: , Rfl:    levOCARNitine-E-Coenzyme Q10 (CO Q-10 PLUS L-CARNITINE PO), Take 1 tablet by mouth daily., Disp: , Rfl:    montelukast (SINGULAIR) 10 MG tablet, , Disp: , Rfl:    Multiple Vitamins-Minerals (EMERGEN-C IMMUNE PO), Take 1 tablet by mouth daily., Disp: , Rfl:    Omega-3 Fatty Acids (EPA PO), Take 1 tablet by mouth 3 (three) times a week. , Disp: , Rfl:    omeprazole (PRILOSEC OTC) 20 MG tablet, Take 20 mg by mouth daily., Disp: , Rfl:    albuterol (VENTOLIN HFA) 108 (90 Base) MCG/ACT inhaler, Inhale into the lungs., Disp: , Rfl:   Allergies  Allergen Reactions   Ivp Dye [Iodinated Contrast Media] Other (See Comments)    Respiratory distress   Covid-19 (Mrna) Vaccine     Flashing lights in visit, headaches, arm numbness, inability to move Flashing lights in visit, headaches, arm numbness, inability to move   Codeine Nausea Only   Niaspan [Niacin Er] Rash and Other (See Comments)    Nightmares    Objective:  BP 122/79   Pulse 62   Temp (!) 97.3 F (36.3 C) (Temporal)   Ht 5\' 1"  (1.549 m)   Wt 120 lb 12.8 oz (54.8 kg)   SpO2 99%   BMI 22.82 kg/m    Physical Exam Vitals reviewed.  Constitutional:      General: She is not in acute distress.    Appearance: Normal appearance. She is not ill-appearing, toxic-appearing or diaphoretic.  HENT:     Head: Normocephalic and atraumatic.  Eyes:     General: No scleral icterus.       Right eye: No discharge.        Left eye: No discharge.     Conjunctiva/sclera: Conjunctivae normal.  Cardiovascular:     Rate and Rhythm: Normal rate.  Pulmonary:     Effort: Pulmonary effort is normal. No respiratory distress.  Musculoskeletal:        General:  Normal range of motion.     Right hand: Swelling (thumb) and tenderness present. No deformity, lacerations or bony tenderness.     Cervical back: Normal range of motion.  Skin:    General: Skin is warm and dry.     Capillary Refill: Capillary refill takes less than 2 seconds.  Neurological:     General: No focal deficit present.     Mental Status: She is alert and oriented to person, place, and time. Mental status is at baseline.  Psychiatric:        Mood and Affect: Mood normal.        Behavior: Behavior normal.        Thought Content: Thought content normal.        Judgment: Judgment normal.

## 2021-09-05 ENCOUNTER — Encounter: Payer: Self-pay | Admitting: Family Medicine

## 2021-09-05 ENCOUNTER — Ambulatory Visit (INDEPENDENT_AMBULATORY_CARE_PROVIDER_SITE_OTHER): Payer: Medicare Other | Admitting: Family Medicine

## 2021-09-05 VITALS — BP 129/76 | HR 61 | Temp 97.6°F | Ht 61.0 in | Wt 119.0 lb

## 2021-09-05 DIAGNOSIS — J4530 Mild persistent asthma, uncomplicated: Secondary | ICD-10-CM

## 2021-09-05 DIAGNOSIS — K219 Gastro-esophageal reflux disease without esophagitis: Secondary | ICD-10-CM

## 2021-09-05 DIAGNOSIS — E782 Mixed hyperlipidemia: Secondary | ICD-10-CM | POA: Diagnosis not present

## 2021-09-05 DIAGNOSIS — R1010 Upper abdominal pain, unspecified: Secondary | ICD-10-CM

## 2021-09-05 DIAGNOSIS — M8589 Other specified disorders of bone density and structure, multiple sites: Secondary | ICD-10-CM

## 2021-09-05 DIAGNOSIS — Z1211 Encounter for screening for malignant neoplasm of colon: Secondary | ICD-10-CM

## 2021-09-05 MED ORDER — MELOXICAM 7.5 MG PO TABS: 7.5000 mg | ORAL_TABLET | Freq: Every day | ORAL | 2 refills | Status: DC

## 2021-09-05 NOTE — Progress Notes (Signed)
Assessment & Plan:  1. Mixed hyperlipidemia Labs to assess since patient has been working on lifestyle changes. - Lipid panel  2. Mild persistent asthma without complication Well controlled on current regimen.   3. Gastroesophageal reflux disease, unspecified whether esophagitis present Well controlled on current regimen.   4. Osteopenia of multiple sites Continue calcium and vitamin D supplements.  5. Upper abdominal pain - meloxicam (MOBIC) 7.5 MG tablet; Take 1 tablet (7.5 mg total) by mouth daily.  Dispense: 30 tablet; Refill: 2  6. Colon cancer screening - Cologuard   Return in about 6 months (around 03/08/2022) for follow-up of chronic medication conditions with anyone.  [name], MSN, APRN, FNP-C [name]  Subjective:    Patient ID: Denise Benton, female    DOB: October 18, 1950, 71 y.o.   MRN: [id number]  Patient Care Team: Loman Brooklyn, FNP as PCP - General (Family Medicine) Noralyn Pick, NP as Nurse Practitioner (Gastroenterology) Sandi Carne, MD (Chiropractic Medicine) Tanda Rockers, MD as Consulting Physician (Pulmonary Disease) Rozetta Nunnery, MD (Inactive) as Consulting Physician (Otolaryngology) Star Age, MD as Attending Physician (Neurology) Franchot Gallo, MD as Consulting Physician (Urology)   Chief Complaint:  Chief Complaint  Patient presents with   Medical Management of Chronic Issues    HPI: Denise Benton is a 71 y.o. female presenting on 09/05/2021 for Medical Management of Chronic Issues  Hyperlipidemia: not on medication to treat as it is not yet indicated. She has been working on lifestyle modifications including diet and exercise to help lower her numbers.    Asthma: has Advair to use twice daily, Albuterol as needed, and Singulair nightly.    GERD: taking omeprazole to control symptoms.   Osteopenia: taking calcium and vitamin D supplements.    New complaints: Patient  is concerned about pain under her diaphragm that has worsened since she has been working with a Physiological scientist; it is also worse when her asthma flares up. Pain is described as cramping and has been present for a "long time". It only lasts 15-20 seconds at a time. It is worse on the left side. Patient believes it is adhesions from a previous abdominal surgery as she was told after her hysterectomy she scars really bad.    Social history:  Relevant past medical, surgical, family and social history reviewed and updated as indicated. Interim medical history since our last visit reviewed.  Allergies and medications reviewed and updated.  DATA REVIEWED: CHART IN EPIC  ROS: Negative unless specifically indicated above in HPI.    Current Outpatient Medications:    acetaminophen (TYLENOL) 325 MG tablet, Take 325 mg by mouth as needed., Disp: , Rfl:    Alum & Mag Hydroxide-Simeth (MYLANTA PO), Take by mouth as needed. , Disp: , Rfl:    B Complex Vitamins (VITAMIN-B COMPLEX PO), Take 1 tablet by mouth daily., Disp: , Rfl:    Biotin 10000 MCG TABS, Take by mouth daily., Disp: , Rfl:    Calcium Carbonate (CALTRATE 600 PO), Take 1 tablet by mouth. Bi weekly, Disp: , Rfl:    chlorhexidine (PERIDEX) 0.12 % solution, SMARTSIG:15 By Mouth Morning-Evening, Disp: , Rfl:    Cholecalciferol (VITAMIN D) 50 MCG (2000 UT) CAPS, Take by mouth daily. , Disp: , Rfl:    Docosahexaenoic Acid (DHA PO), Take 1 tablet by mouth once a week. , Disp: , Rfl:    ELDERBERRY PO, Take 1 Dose by mouth as needed., Disp: , Rfl:  fluticasone-salmeterol (ADVAIR HFA) 115-21 MCG/ACT inhaler, Inhale 2 puffs into the lungs 2 (two) times daily. As needed, Disp: , Rfl:    folic acid (FOLVITE) 644 MCG tablet, Take 400 mcg by mouth once a week. , Disp: , Rfl:    ibuprofen (ADVIL) 600 MG tablet, , Disp: , Rfl:    levOCARNitine-E-Coenzyme Q10 (CO Q-10 PLUS L-CARNITINE PO), Take 1 tablet by mouth daily., Disp: , Rfl:    montelukast  (SINGULAIR) 10 MG tablet, , Disp: , Rfl:    Multiple Vitamins-Minerals (EMERGEN-C IMMUNE PO), Take 1 tablet by mouth daily., Disp: , Rfl:    Omega-3 Fatty Acids (EPA PO), Take 1 tablet by mouth 3 (three) times a week. , Disp: , Rfl:    omeprazole (PRILOSEC OTC) 20 MG tablet, Take 20 mg by mouth daily., Disp: , Rfl:    albuterol (VENTOLIN HFA) 108 (90 Base) MCG/ACT inhaler, Inhale into the lungs., Disp: , Rfl:    Allergies  Allergen Reactions   Ivp Dye [Iodinated Contrast Media] Other (See Comments)    Respiratory distress   Covid-19 (Mrna) Vaccine     Flashing lights in visit, headaches, arm numbness, inability to move Flashing lights in visit, headaches, arm numbness, inability to move Flashing lights in visit, headaches, arm numbness, inability to move  Flashing lights in visit, headaches, arm numbness, inability to move Flashing lights in visit, headaches, arm numbness, inability to move   Codeine Nausea Only   Niaspan [Niacin Er] Rash and Other (See Comments)    Nightmares   Past Medical History:  Diagnosis Date   Allergy    seasonal   Arthritis    Asthma    Duodenal ulcer    Gallstones    GERD (gastroesophageal reflux disease)    HLD (hyperlipidemia)    IBS (irritable bowel syndrome)    Kidney stones    Lumbosacral disc disease    L4-5, L5-S1   Osteopenia     Past Surgical History:  Procedure Laterality Date   BREAST CYST ASPIRATION Right    pt has cysts aspiration done for lumps   CESAREAN SECTION  1980   x2; again in [place]  07/2011   from scalp - keratosis    Social History   Socioeconomic History   Marital status: Married    Spouse name: Jeneen Rinks    Number of children: 2   Years of education: 4 years college   Highest education level: Bachelor's degree (e.g., BA, AB, BS)  Occupational History   Occupation: Retired RN/NP   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   Tobacco Use   Smoking status: Former    Packs/day: 0.50    Years: 1.00    Total pack years: 0.50    Types: Cigarettes    Quit date: 01/13/1969    Years since quitting: 52.6   Smokeless tobacco: Never  Vaping Use   Vaping Use: Never used  Substance and Sexual Activity   Alcohol use: No   Drug use: No   Sexual activity: Not Currently  Other Topics Concern   Not on file  Social History Narrative   Lives home with husband - daughter in [place] here from [place] in 2020   Social Determinants of Health   Financial Resource Strain: [place]  (06/12/2021)   Overall Financial Resource Strain (CARDIA)    Difficulty of Paying Living Expenses: Not hard at all  Food Insecurity: No Food Insecurity (06/12/2021)   Hunger Vital Sign    Worried About Running Out of Food in the Last Year: Never true    Ran Out of Food in the Last Year: Never true  Transportation Needs: No Transportation Needs (06/12/2021)   PRAPARE - Hydrologist (Medical): No    Lack of Transportation (Non-Medical): No  Physical Activity: Sufficiently Active (06/12/2021)   Exercise Vital Sign    Days of Exercise per Week: 5 days    Minutes of Exercise per Session: 30 min  Stress: No Stress Concern Present (06/12/2021)   [place]    Feeling of Stress : Not at all  Social Connections: [place] (06/12/2021)   Social Connection and Isolation Panel [NHANES]    Frequency of Communication with Friends and Family: More than three times a week    Frequency of Social Gatherings with Friends and Family: More than three times a week    Attends Religious Services: More than 4 times per year    Active Member of [employer] or Organizations: Yes    Attends Music therapist: More than 4 times per year    Marital Status: Married  Human resources officer Violence: Not At Risk (06/12/2021)   Humiliation, Afraid, Rape,  and Kick questionnaire    Fear of Current or Ex-Partner: No    Emotionally Abused: No    Physically Abused: No    Sexually Abused: No        Objective:    BP 129/76   Pulse 61   Temp 97.6 F (36.4 C) (Temporal)   Ht 5' 1"  (1.549 m)   Wt 119 lb (54 kg)   SpO2 99%   BMI 22.48 kg/m   Wt Readings from Last 3 Encounters:  09/05/21 119 lb (54 kg)  08/14/21 120 lb 12.8 oz (54.8 kg)  06/12/21 120 lb (54.4 kg)    Physical Exam Vitals reviewed.  Constitutional:      General: She is not in acute distress.    Appearance: Normal appearance. She is not ill-appearing, toxic-appearing or diaphoretic.  HENT:     Head: Normocephalic and atraumatic.  Eyes:     General: No scleral icterus.       Right eye: No discharge.        Left eye: No discharge.     Conjunctiva/sclera: Conjunctivae normal.  Cardiovascular:     Rate and Rhythm: Normal rate and regular rhythm.     Heart sounds: Normal heart sounds. No murmur heard.    No friction rub. No gallop.  Pulmonary:     Effort: Pulmonary effort is normal. No respiratory distress.     Breath sounds: Normal breath sounds. No stridor. No wheezing, rhonchi or rales.  Abdominal:     General: Abdomen is flat. Bowel sounds are normal. There is no distension or abdominal bruit. There are no signs of injury.     Palpations: Abdomen is soft. There is no shifting dullness, fluid wave, hepatomegaly, splenomegaly, mass or pulsatile mass.     Tenderness: There is abdominal tenderness in the epigastric area and left upper quadrant.  Musculoskeletal:        General: Normal range of motion.     Cervical back: Normal range of motion.  Skin:    General: Skin is warm and dry.     Capillary Refill: Capillary refill takes less than 2 seconds.  Neurological:  General: No focal deficit present.     Mental Status: She is alert and oriented to person, place, and time. Mental status is at baseline.  Psychiatric:        Mood and Affect: Mood normal.         Behavior: Behavior normal.        Thought Content: Thought content normal.        Judgment: Judgment normal.     Lab Results  Component Value Date   TSH 3.770 03/07/2021   Lab Results  Component Value Date   WBC 6.2 03/07/2021   HGB 13.2 03/07/2021   HCT 40.6 03/07/2021   MCV 93 03/07/2021   PLT 355 03/07/2021   Lab Results  Component Value Date   NA 144 03/07/2021   K 5.0 03/07/2021   CO2 26 03/07/2021   GLUCOSE 76 03/07/2021   BUN 18 03/07/2021   CREATININE 0.93 03/07/2021   BILITOT 0.7 03/07/2021   ALKPHOS 87 03/07/2021   AST 24 03/07/2021   ALT 16 03/07/2021   PROT 6.2 03/07/2021   ALBUMIN 4.2 03/07/2021   CALCIUM 9.8 03/07/2021   ANIONGAP 8 01/12/2021   EGFR 66 03/07/2021   GFR 69.19 03/22/2019   Lab Results  Component Value Date   CHOL 198 03/07/2021   Lab Results  Component Value Date   HDL 71 03/07/2021   Lab Results  Component Value Date   LDLCALC 108 (H) 03/07/2021   Lab Results  Component Value Date   TRIG 108 03/07/2021   Lab Results  Component Value Date   CHOLHDL 2.8 03/07/2021   No results found for: "HGBA1C"

## 2021-09-06 LAB — LIPID PANEL
Chol/HDL Ratio: 2.7 ratio (ref 0.0–4.4)
Cholesterol, Total: 208 mg/dL — ABNORMAL HIGH (ref 100–199)
HDL: 78 mg/dL (ref >39)
LDL Chol Calc (NIH): 111 mg/dL — ABNORMAL HIGH (ref 0–99)
Triglycerides: 110 mg/dL (ref 0–149)
VLDL Cholesterol Cal: 19 mg/dL (ref 5–40)

## 2021-09-06 NOTE — Progress Notes (Signed)
Patient returning call. Please call back

## 2021-09-20 LAB — COLOGUARD: COLOGUARD: NEGATIVE

## 2021-10-01 ENCOUNTER — Ambulatory Visit (INDEPENDENT_AMBULATORY_CARE_PROVIDER_SITE_OTHER): Payer: Medicare Other | Admitting: Family Medicine

## 2021-10-01 ENCOUNTER — Encounter: Payer: Self-pay | Admitting: Family Medicine

## 2021-10-01 VITALS — BP 128/72 | HR 61 | Temp 97.8°F | Resp 20 | Ht 61.0 in | Wt 124.0 lb

## 2021-10-01 DIAGNOSIS — S63641D Sprain of metacarpophalangeal joint of right thumb, subsequent encounter: Secondary | ICD-10-CM

## 2021-10-01 NOTE — Progress Notes (Addendum)
Assessment & Plan:  1. Sprain of metacarpophalangeal (MCP) joint of right thumb, subsequent encounter Offered a refill of meloxicam, but patient declined.  Encouraged her to continue applying Voltaren gel, wrapping it with Coban, and taking Tylenol for pain.  Suggested staying out of work to allow time for healing, but patient does not wish to do this.  Referring for physical therapy, which she would like to complete with the physical therapist in her chiropractor's office, Dr. Bethann Goo in [place]. - Ambulatory referral to Physical Therapy   Follow up plan: Return if symptoms worsen or fail to improve.  [name], MSN, APRN, FNP-C [name]  Subjective:   Patient ID: Denise Benton, female    DOB: Aug 11, 1950, 71 y.o.   MRN: [id number]  HPI: Denise Benton is a 71 y.o. female presenting on 10/01/2021 for sprain on MCP follow up  (Right (original pain started mid -July) )  Patient is continuing to have difficulty with her right thumb that she sprained two months ago.  She has completed a round of steroids and had a negative x-ray of the hand.  She has been applying thumb splints, a wrist splint, and wrapping it with Coban.  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  She occasionally takes Tylenol or applies Voltaren gel for the pain.   ROS: Negative unless specifically indicated above in HPI.   Relevant past medical history reviewed and updated as indicated.   Allergies and medications reviewed and updated.   Current Outpatient Medications:    acetaminophen (TYLENOL) 325 MG tablet, Take 325 mg by mouth as needed., Disp: , Rfl:    B Complex Vitamins (VITAMIN-B COMPLEX PO), Take 1 tablet by mouth daily., Disp: , Rfl:    Biotin 10000 MCG TABS, Take by mouth daily., Disp: , Rfl:    chlorhexidine (PERIDEX) 0.12 % solution, SMARTSIG:15 By Mouth Morning-Evening, Disp: , Rfl:    Cholecalciferol (VITAMIN D) 50 MCG (2000 UT) CAPS, Take  by mouth daily. , Disp: , Rfl:    Docosahexaenoic Acid (DHA PO), Take 1 tablet by mouth once a week. , Disp: , Rfl:    ELDERBERRY PO, Take 1 Dose by mouth as needed., Disp: , Rfl:    fluticasone-salmeterol (ADVAIR HFA) 115-21 MCG/ACT inhaler, Inhale 2 puffs into the lungs 2 (two) times daily. As needed, Disp: , Rfl:    folic acid (FOLVITE) 998 MCG tablet, Take 400 mcg by mouth once a week. , Disp: , Rfl:    ibuprofen (ADVIL) 600 MG tablet, , Disp: , Rfl:    levOCARNitine-E-Coenzyme Q10 (CO Q-10 PLUS L-CARNITINE PO), Take 1 tablet by mouth daily., Disp: , Rfl:    montelukast (SINGULAIR) 10 MG tablet, , Disp: , Rfl:    Multiple Vitamins-Minerals (EMERGEN-C IMMUNE PO), Take 1 tablet by mouth daily., Disp: , Rfl:    Omega-3 Fatty Acids (EPA PO), Take 1 tablet by mouth 3 (three) times a week. , Disp: , Rfl:    omeprazole (PRILOSEC OTC) 20 MG tablet, Take 20 mg by mouth daily., Disp: , Rfl:    albuterol (VENTOLIN HFA) 108 (90 Base) MCG/ACT inhaler, Inhale into the lungs., Disp: , Rfl:   Allergies  Allergen Reactions   Ivp Dye [Iodinated Contrast Media] Other (See Comments)    Respiratory distress   Covid-19 (Mrna) Vaccine     Flashing lights in visit, headaches, arm numbness, inability to move Flashing lights in visit, headaches, arm numbness, inability  to move Flashing lights in visit, headaches, arm numbness, inability to move  Flashing lights in visit, headaches, arm numbness, inability to move Flashing lights in visit, headaches, arm numbness, inability to move   Codeine Nausea Only   Niaspan [Niacin Er] Rash and Other (See Comments)    Nightmares    Objective:   BP 128/72   Pulse 61   Temp 97.8 F (36.6 C)   Resp 20   Ht 5\' 1"  (1.549 m)   Wt 124 lb (56.2 kg)   SpO2 99%   BMI 23.43 kg/m    Physical Exam Vitals reviewed.  Constitutional:      General: She is not in acute distress.    Appearance: Normal appearance. She is not ill-appearing, toxic-appearing or diaphoretic.   HENT:     Head: Normocephalic and atraumatic.  Eyes:     General: No scleral icterus.       Right eye: No discharge.        Left eye: No discharge.     Conjunctiva/sclera: Conjunctivae normal.  Cardiovascular:     Rate and Rhythm: Normal rate.  Pulmonary:     Effort: Pulmonary effort is normal. No respiratory distress.  Musculoskeletal:     Right hand: Tenderness present. No swelling, deformity, lacerations or bony tenderness. Decreased range of motion.     Cervical back: Normal range of motion.  Skin:    General: Skin is warm and dry.     Capillary Refill: Capillary refill takes less than 2 seconds.  Neurological:     General: No focal deficit present.     Mental Status: She is alert and oriented to person, place, and time. Mental status is at baseline.  Psychiatric:        Mood and Affect: Mood normal.        Behavior: Behavior normal.        Thought Content: Thought content normal.        Judgment: Judgment normal.

## 2021-10-10 ENCOUNTER — Telehealth: Payer: Self-pay | Admitting: Family Medicine

## 2021-10-10 NOTE — Telephone Encounter (Signed)
Please fax PT referral to [phone]

## 2021-10-24 NOTE — Telephone Encounter (Signed)
Left detailed message.   

## 2021-12-09 ENCOUNTER — Ambulatory Visit (INDEPENDENT_AMBULATORY_CARE_PROVIDER_SITE_OTHER): Payer: Medicare Other | Admitting: Nurse Practitioner

## 2021-12-09 ENCOUNTER — Encounter: Payer: Self-pay | Admitting: Nurse Practitioner

## 2021-12-09 VITALS — Temp 98.7°F | Ht 61.0 in | Wt 121.0 lb

## 2021-12-09 DIAGNOSIS — M79644 Pain in right finger(s): Secondary | ICD-10-CM | POA: Diagnosis not present

## 2021-12-09 MED ORDER — IBUPROFEN 600 MG PO TABS: 600.0000 mg | ORAL_TABLET | Freq: Three times a day (TID) | ORAL | 1 refills | Status: AC | PRN

## 2021-12-09 NOTE — Progress Notes (Signed)
Acute Office Visit  Subjective:     Patient ID: Denise Benton, female    DOB: 1950/11/03, 71 y.o.   MRN: [id number]  Chief Complaint  Patient presents with   Hand Pain    3 months ago     Hand Pain  The incident occurred more than 1 week ago. The incident occurred at work. Injury mechanism: assisting a patient. Pain location: right thumb. The pain is moderate. The pain has been Constant since the incident. Pertinent negatives include no numbness or tingling. The symptoms are aggravated by movement and lifting. She has tried NSAIDs for the symptoms. The treatment provided mild relief.     Review of Systems  Constitutional: Negative.  Negative for fever.  HENT: Negative.    Respiratory: Negative.    Cardiovascular: Negative.   Genitourinary: Negative.   Musculoskeletal:  Positive for joint pain.  Skin: Negative.  Negative for rash.  Neurological:  Negative for tingling and numbness.  All other systems reviewed and are negative.       Objective:    Temp 98.7 F (37.1 C)   Ht 5\' 1"  (1.549 m)   Wt 121 lb (54.9 kg)   BMI 22.86 kg/m  BP Readings from Last 3 Encounters:  10/01/21 128/72  09/05/21 129/76  08/14/21 122/79   Wt Readings from Last 3 Encounters:  12/09/21 121 lb (54.9 kg)  10/01/21 124 lb (56.2 kg)  09/05/21 119 lb (54 kg)      Physical Exam Vitals and nursing note reviewed.  Constitutional:      Appearance: Normal appearance.  HENT:     Head: Normocephalic.     Right Ear: External ear normal.     Left Ear: External ear normal.     Nose: Nose normal.     Mouth/Throat:     Mouth: Mucous membranes are moist.     Pharynx: Oropharynx is clear.  Eyes:     Conjunctiva/sclera: Conjunctivae normal.  Cardiovascular:     Rate and Rhythm: Normal rate and regular rhythm.     Pulses: Normal pulses.     Heart sounds: Normal heart sounds.  Pulmonary:     Effort: Pulmonary effort is normal.     Breath sounds: Normal breath sounds.  Abdominal:      General: Bowel sounds are normal.  Musculoskeletal:     Comments: Right thumb pain   Skin:    General: Skin is warm.     Findings: No erythema or rash.  Neurological:     General: No focal deficit present.     Mental Status: She is alert and oriented to person, place, and time.  Psychiatric:        Behavior: Behavior normal.     No results found for any visits on 12/09/21.      Assessment & Plan:  Pain in right thumb not well-controlled.  Incident happened at work.  Patient continues to work and unable to get off.  After physical therapy symptoms are still not resolved.  Completed referral to [facility].  Continue ibuprofen 600 mg tablet by mouth as needed.  Warm compress as needed.  Immobilizer and hand brace applied.  Follow-up for worsening unresolved symptoms. Problem List Items Addressed This Visit   None Visit Diagnoses     Thumb pain, right    -  Primary   Relevant Medications   ibuprofen (ADVIL) 600 MG tablet   Other Relevant Orders   AMB referral to orthopedics  Meds ordered this encounter  Medications   ibuprofen (ADVIL) 600 MG tablet    Sig: Take 1 tablet (600 mg total) by mouth every 8 (eight) hours as needed for moderate pain.    Dispense:  30 tablet    Refill:  1    Order Specific Question:   Supervising Provider    Answer:   Mechele Claude [phone]    Return if symptoms worsen or fail to improve.  Daryll Drown, NP

## 2021-12-09 NOTE — Patient Instructions (Signed)
Thumb Sprain  A thumb sprain is an injury to one of the bands of tissue that connect bones to each other (a ligament) in your thumb. The ligament may be stretched too much, or it may be torn. A tear can be either partial or complete. How bad, or severe, the sprain is depends on how much of the ligament was damaged or torn. What are the causes? A thumb sprain is often caused by a fall or an accident, such as when you hold your hands out to catch something or to protect yourself. What increases the risk? This injury is more likely to occur in people who play sports that involve: A risk of falling, such as skiing. Catching an object, such as basketball. What are the signs or symptoms? Symptoms of this condition include: Not being able to move the thumb normally. Swelling. Tenderness. Bruising. How is this diagnosed? This condition may be diagnosed based on: Your symptoms and medical history. Your health care provider may ask about any recent injuries to your thumb. A physical exam. Imaging studies, such as X-rays, ultrasound, or MRI. How is this treated? Treatment for this condition depends on how severe your sprain is. If your ligament is overstretched or partially torn, treatment usually involves keeping your thumb in a fixed position (immobilization) for at least 4 to 6 weeks. Your health care provider will apply a bandage (dressing), splint, brace, or cast to keep your thumb from moving until it heals. If your ligament is fully torn, you may need surgery to reconnect the ligament to the bone. After surgery, you will need to wear a cast or splint on your thumb. Your health care provider may also recommend physical therapy to strengthen your thumb. Follow these instructions at home: If you have a removable splint, bandage, or brace: Wear the splint, bandage, or brace as told by your health care provider. Remove it only as told by your health care provider. Check the skin around the splint,  bandage, or brace every day. Tell your health care provider about any concerns. Loosen the splint, bandage, or brace if your thumb or fingers tingle, become numb, or turn cold and blue. Keep it clean and dry. If you have a nonremovable cast: Do not put pressure on any part of the cast until it is fully hardened. This may take several hours. Do not stick anything inside the cast to scratch your skin. Doing that increases your risk of infection. Check the skin around the cast every day. Tell your health care provider about any concerns. You may put lotion on dry skin around the edges of the cast. Do not put lotion on the skin underneath the cast. Keep it clean and dry. Bathing Do not take baths, swim, or use a hot tub until your health care provider approves. Ask your health care provider if you may take showers. You may only be allowed to take sponge baths. If your splint, bandage, brace, or cast is not waterproof: Do not let it get wet. Cover it with a watertight covering when you take a bath or shower. Managing pain, stiffness, and swelling  If directed, put ice on your thumb. To do this: If you have a removable splint, bandage, or brace, remove it as told by your health care provider. Put ice in a plastic bag. Place a towel between your skin and the bag, or between your cast and the bag. Leave the ice on for 20 minutes, 2-3 times a day. Remove the ice  if your skin turns bright red. This is very important. If you cannot feel pain, heat, or cold, you have a greater risk of damage to the area. Move your fingers often to reduce stiffness and swelling. Raise (elevate) the injured area above the level of your heart while you are sitting or lying down. Activity Return to your normal activities as told by your health care provider. Ask your health care provider what activities are safe for you. Do physical therapy exercises as directed. After your splint, bandage, brace, or cast is removed, your  health care provider may recommend that you: Move your thumb in circles. Touch your thumb to your pinky finger. Do these exercises several times a day. Ask your health care provider if you may use a hand exerciser to strengthen your muscles. If your thumb feels stiff while you are exercising it, try doing the exercises while soaking your hand in warm water. Driving Ask your health care provider when it is safe to drive if you have a splint, bandage, brace, or cast on your hand or thumb. Ask your health care provider if the medicine prescribed to you requires you to avoid driving or using machinery. General instructions Take over-the-counter and prescription medicines only as told by your health care provider. Do not use any products that contain nicotine or tobacco. These products include cigarettes, chewing tobacco, and vaping devices, such as e-cigarettes. These can delay bone healing. If you need help quitting, ask your health care provider. Do not wear rings on your injured thumb. Keep all follow-up visits. This is important. Contact a health care provider if: You have pain that gets worse or does not get better with medicine. You have bruising or swelling that gets worse. Your cast, brace, or splint is damaged. Get help right away if: Your thumb feels numb, tingles, turns cold, or turns blue, even after loosening your splint, bandage, or brace. Summary A thumb sprain is an injury to one of the bands of tissue that connect bones to each other (a ligament) in your thumb. Thumb sprains are more likely to occur in people who play sports that involve a risk of falling or having to catch an object. Treatment will depend on how severe the sprain is, but it will require keeping the thumb in a fixed position (immobilization). It might require surgery. Make sure you understand and follow all of your health care provider's instructions for home care. This information is not intended to replace  advice given to you by your health care provider. Make sure you discuss any questions you have with your health care provider. Document Revised: 11/23/2019 Document Reviewed: 11/23/2019 Elsevier Patient Education  2023 [employer].

## 2022-01-25 ENCOUNTER — Other Ambulatory Visit: Payer: Self-pay

## 2022-01-25 ENCOUNTER — Encounter (HOSPITAL_COMMUNITY): Payer: Self-pay

## 2022-01-25 ENCOUNTER — Emergency Department (HOSPITAL_COMMUNITY)
Admission: EM | Admit: 2022-01-25 | Discharge: 2022-01-25 | Disposition: A | Discharge status: Home / Self Care | Payer: Medicare Other | Attending: Emergency Medicine | Admitting: Emergency Medicine

## 2022-01-25 ENCOUNTER — Emergency Department (HOSPITAL_COMMUNITY): Payer: Medicare Other

## 2022-01-25 DIAGNOSIS — Z1152 Encounter for screening for COVID-19: Secondary | ICD-10-CM | POA: Diagnosis not present

## 2022-01-25 DIAGNOSIS — J0100 Acute maxillary sinusitis, unspecified: Secondary | ICD-10-CM

## 2022-01-25 DIAGNOSIS — D72829 Elevated white blood cell count, unspecified: Secondary | ICD-10-CM | POA: Diagnosis not present

## 2022-01-25 DIAGNOSIS — B338 Other specified viral diseases: Secondary | ICD-10-CM

## 2022-01-25 DIAGNOSIS — J019 Acute sinusitis, unspecified: Secondary | ICD-10-CM | POA: Diagnosis not present

## 2022-01-25 DIAGNOSIS — J45909 Unspecified asthma, uncomplicated: Secondary | ICD-10-CM | POA: Insufficient documentation

## 2022-01-25 DIAGNOSIS — B974 Respiratory syncytial virus as the cause of diseases classified elsewhere: Secondary | ICD-10-CM | POA: Insufficient documentation

## 2022-01-25 DIAGNOSIS — R051 Acute cough: Secondary | ICD-10-CM

## 2022-01-25 LAB — CBC WITH DIFFERENTIAL/PLATELET
Abs Immature Granulocytes: 0.03 10*3/uL (ref 0.00–0.07)
Basophils Absolute: 0.1 10*3/uL (ref 0.0–0.1)
Basophils Relative: 1 %
Eosinophils Absolute: 0.2 10*3/uL (ref 0.0–0.5)
Eosinophils Relative: 2 %
HCT: 43.3 % (ref 36.0–46.0)
Hemoglobin: 14.2 g/dL (ref 12.0–15.0)
Immature Granulocytes: 0 %
Lymphocytes Relative: 17 %
Lymphs Abs: 2 10*3/uL (ref 0.7–4.0)
MCH: 31.6 pg (ref 26.0–34.0)
MCHC: 32.8 g/dL (ref 30.0–36.0)
MCV: 96.2 fL (ref 80.0–100.0)
Monocytes Absolute: 1.1 10*3/uL — ABNORMAL HIGH (ref 0.1–1.0)
Monocytes Relative: 10 %
Neutro Abs: 8.1 10*3/uL — ABNORMAL HIGH (ref 1.7–7.7)
Neutrophils Relative %: 70 %
Platelets: 315 10*3/uL (ref 150–400)
RBC: 4.5 MIL/uL (ref 3.87–5.11)
RDW: 12.8 % (ref 11.5–15.5)
WBC: 11.5 10*3/uL — ABNORMAL HIGH (ref 4.0–10.5)
nRBC: 0 % (ref 0.0–0.2)

## 2022-01-25 LAB — BASIC METABOLIC PANEL
Anion gap: 13 (ref 5–15)
BUN: 22 mg/dL (ref 8–23)
CO2: 24 mmol/L (ref 22–32)
Calcium: 9.3 mg/dL (ref 8.9–10.3)
Chloride: 100 mmol/L (ref 98–111)
Creatinine, Ser: 1.07 mg/dL — ABNORMAL HIGH (ref 0.44–1.00)
GFR, Estimated: 56 mL/min — ABNORMAL LOW (ref >60)
Glucose, Bld: 95 mg/dL (ref 70–99)
Potassium: 4.2 mmol/L (ref 3.5–5.1)
Sodium: 137 mmol/L (ref 135–145)

## 2022-01-25 LAB — RESP PANEL BY RT-PCR (RSV, FLU A&B, COVID)  RVPGX2
Influenza A by PCR: NEGATIVE
Influenza B by PCR: NEGATIVE
Resp Syncytial Virus by PCR: POSITIVE — AB
SARS Coronavirus 2 by RT PCR: NEGATIVE

## 2022-01-25 MED ORDER — ALBUTEROL SULFATE HFA 108 (90 BASE) MCG/ACT IN AERS: 1.0000 | INHALATION_SPRAY | Freq: Four times a day (QID) | RESPIRATORY_TRACT | 0 refills | Status: DC | PRN

## 2022-01-25 MED ORDER — PREDNISONE 10 MG PO TABS: 40.0000 mg | ORAL_TABLET | Freq: Every day | ORAL | 0 refills | Status: DC

## 2022-01-25 MED ORDER — IPRATROPIUM-ALBUTEROL 0.5-2.5 (3) MG/3ML IN SOLN
3.0000 mL | Freq: Once | RESPIRATORY_TRACT | Status: AC
  Administered 2022-01-25: 3 mL via RESPIRATORY_TRACT
  Filled 2022-01-25: qty 3

## 2022-01-25 NOTE — ED Provider Notes (Signed)
[facility] EMERGENCY DEPARTMENT Provider Note   CSN: [id number] Arrival date & time: 01/25/22  1951     History  Chief Complaint  Patient presents with   Sinusitis    Denise Benton is a 72 y.o. female.  With a history of GERD, asthma who presents ED for evaluation of cough, congestion, rhinorrhea.  Symptoms have been present since Tuesday.  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  [facility] and was prescribed a Z-Pak and promethazine for her symptoms.  States that her symptoms have continued to get worse.  States she feels like she has to cough something up, however the cough remains dry.  She has tried over-the-counter antitussives including Delsym, Mucinex, and phenylephrine.  States these, in conjunction with the promethazine, have not changed her cough.  She states that when she moves around, she coughs so much that she feels short of breath and she has difficulty getting it to stop.  Denies fevers, chills, chest pain, shortness of breath without the cough.  Has a headache today which she reports is secondary to persistent cough.  Also denies nausea, vomiting, abdominal pain, diarrhea.   Sinusitis Associated symptoms: congestion, cough, rhinorrhea and shortness of breath        Home Medications Prior to Admission medications   Medication Sig Start Date End Date Taking? Authorizing Provider  albuterol (VENTOLIN HFA) 108 (90 Base) MCG/ACT inhaler Inhale 1-2 puffs into the lungs every 6 (six) hours as needed for wheezing or shortness of breath. 01/25/22  Yes Jasara Corrigan, [place] Folk, PA-C  predniSONE (DELTASONE) 10 MG tablet Take 4 tablets (40 mg total) by mouth daily with breakfast. 01/25/22  Yes Joycelyn Liska, [place] Folk, PA-C  acetaminophen (TYLENOL) 325 MG tablet Take 325 mg by mouth as needed.    [provider]  B Complex Vitamins (VITAMIN-B COMPLEX PO) Take 1 tablet by mouth daily.    [provider]  Biotin 10000 MCG TABS Take by mouth daily.    [provider]  chlorhexidine (PERIDEX) 0.12 % solution SMARTSIG:15 By Mouth Morning-Evening 08/24/20   [provider]  Cholecalciferol (VITAMIN D) 50 MCG (2000 UT) CAPS Take by mouth daily.     [provider]  Docosahexaenoic Acid (DHA PO) Take 1 tablet by mouth once a week.     [provider]  ELDERBERRY PO Take 1 Dose by mouth as needed.    [provider]  fluticasone-salmeterol (ADVAIR HFA) 115-21 MCG/ACT inhaler Inhale 2 puffs into the lungs 2 (two) times daily. As needed    [provider]  folic acid (FOLVITE) 619 MCG tablet Take 400 mcg by mouth once a week.     [provider]  ibuprofen (ADVIL) 600 MG tablet Take 1 tablet (600 mg total) by mouth every 8 (eight) hours as needed for moderate pain. 12/09/21   Ivy Lynn, NP  levOCARNitine-E-Coenzyme Q10 (CO Q-10 PLUS L-CARNITINE PO) Take 1 tablet by mouth daily.    [provider]  montelukast (SINGULAIR) 10 MG tablet  06/12/20   [provider]  Multiple Vitamins-Minerals (EMERGEN-C IMMUNE PO) Take 1 tablet by mouth daily.    [provider]  Omega-3 Fatty Acids (EPA PO) Take 1 tablet by mouth 3 (three) times a week.     [provider]  omeprazole (PRILOSEC OTC) 20 MG tablet Take 20 mg by mouth daily.    [provider]      Allergies    Ivp dye [iodinated contrast media], Covid-19 (mrna) vaccine, Codeine, and Niaspan [niacin er]    Review of Systems   Review of Systems  HENT:  Positive for congestion, rhinorrhea and sinus pressure.   Respiratory:  Positive for cough and shortness of breath.   All other systems reviewed and are negative.   Physical Exam Updated Vital Signs BP 123/67   Pulse 81   Temp 99.7 F (37.6 C) (Oral)   Resp 20   Ht 5\' 1"  (1.549 m)   Wt 54.4 kg   SpO2 100%   BMI 22.67 kg/m  Physical Exam Vitals and nursing note reviewed.   Constitutional:      General: She is not in acute distress.    Appearance: Normal appearance. She is well-developed. She is not ill-appearing, toxic-appearing or diaphoretic.  HENT:     Head: Normocephalic and atraumatic.     Mouth/Throat:     Mouth: Mucous membranes are moist.     Pharynx: Oropharynx is clear. No oropharyngeal exudate or posterior oropharyngeal erythema.     Comments: No postnasal drip Eyes:     Conjunctiva/sclera: Conjunctivae normal.  Cardiovascular:     Rate and Rhythm: Normal rate and regular rhythm.     Heart sounds: No murmur heard. Pulmonary:     Effort: Pulmonary effort is normal. No respiratory distress.     Breath sounds: No stridor. Rhonchi present. No wheezing or rales.     Comments: Persistent non productive cough noted Abdominal:     Palpations: Abdomen is soft.     Tenderness: There is no abdominal tenderness.  Musculoskeletal:        General: No swelling.     Cervical back: Neck supple.     Right lower leg: No edema.     Left lower leg: No edema.  Skin:    General: Skin is warm and dry.     Capillary Refill: Capillary refill takes less than 2 seconds.  Neurological:     General: No focal deficit present.     Mental Status: She is alert and oriented to person, place, and time.  Psychiatric:        Mood and Affect: Mood normal.     ED Results / Procedures / Treatments   Labs (all labs ordered are listed, but only abnormal results are displayed) Labs Reviewed  RESP PANEL BY RT-PCR (RSV, FLU A&B, COVID)  RVPGX2 - Abnormal; Notable for the following components:      Result Value   Resp Syncytial Virus by PCR POSITIVE (*)    All other components within normal limits  CBC WITH DIFFERENTIAL/PLATELET - Abnormal; Notable for the following components:   WBC 11.5 (*)    Neutro Abs 8.1 (*)    Monocytes Absolute 1.1 (*)    All other components within normal limits  BASIC METABOLIC PANEL - Abnormal; Notable for the following components:    Creatinine, Ser 1.07 (*)    GFR, Estimated 56 (*)    All other components within normal limits    EKG None  Radiology DG Chest 2 View  Result Date: 01/25/2022 CLINICAL DATA:  sob EXAM: CHEST - 2 VIEW COMPARISON:  October 30, 2009 FINDINGS: The cardiomediastinal silhouette is unchanged in contour. No pleural effusion. No pneumothorax. No acute pleuroparenchymal abnormality. Visualized abdomen is unremarkable. Multilevel degenerative changes of the thoracic spine. IMPRESSION: No acute cardiopulmonary abnormality. Electronically Signed   By: November 01, 2009 M.D.  On: 01/25/2022 20:51    Procedures Procedures    Medications Ordered in ED Medications  ipratropium-albuterol (DUONEB) 0.5-2.5 (3) MG/3ML nebulizer solution 3 mL (3 mLs Nebulization Given 01/25/22 2040)    ED Course/ Medical Decision Making/ A&P                             Medical Decision Making Amount and/or Complexity of Data Reviewed Labs: ordered. Radiology: ordered.  Risk Prescription drug management.  This patient presents to the ED for concern of sinusitis, this involves an extensive number of treatment options, and is a complaint that carries with it a high risk of complications and morbidity.  The differential diagnosis includes Flu, COVID, RSV  Co morbidities that complicate the patient evaluation   asthma  My initial workup includes basic labs, respiratory panel, chest x-ray, DuoNeb  Additional history obtained from: Nursing notes from this visit.  I ordered, reviewed and interpreted labs which include: BMP, CBC.  Slight leukocytosis of 11.5 which is likely reactive.  No other abnormalities  I ordered imaging studies including chest x-ray I independently visualized and interpreted imaging which showed normal I agree with the radiologist interpretation  Afebrile, hemodynamically stable.  72 year old female presenting to the ED for evaluation of sinusitis and cough.  Symptoms began 5 days ago and  have gotten worse despite over-the-counter and prescription medications.  Patient did not have any testing done at the time of symptom onset.  Physical exam is remarkable for rhonchi and nasal congestion.  Patient did test positive for RSV today.  She had slight leukocytosis 1.5 which is likely reactive.  Chest x-ray negative for pneumonia.  No rales on auscultation.  Patient will be sent a prescription for prednisone and a refill of her albuterol inhaler to treat RSV bronchitis.  She was also educated on over-the-counter treatments including Claritin, Flonase, Mucinex, Robitussin.  She declined Tessalon prescription.  She was encouraged to follow-up with her primary care provider in 1 week for reevaluation.  Was given return precautions.  Stable at discharge.  At this time there does not appear to be any evidence of an acute emergency medical condition and the patient appears stable for discharge with appropriate outpatient follow up. Diagnosis was discussed with patient who verbalizes understanding of care plan and is agreeable to discharge. I have discussed return precautions with patient and husband who verbalizes understanding. Patient encouraged to follow-up with their PCP within 1 week. All questions answered.  Patient's case discussed with Dr. Sabra Heck who agrees with plan to discharge with follow-up.   Note: Portions of this report may have been transcribed using voice recognition software. Every effort was made to ensure accuracy; however, inadvertent computerized transcription errors may still be present.         Final Clinical Impression(s) / ED Diagnoses Final diagnoses:  RSV (respiratory syncytial virus infection)  Acute cough  Acute non-recurrent maxillary sinusitis    Rx / DC Orders ED Discharge Orders          Ordered    predniSONE (DELTASONE) 10 MG tablet  Daily with breakfast        01/25/22 2143    albuterol (VENTOLIN HFA) 108 (90 Base) MCG/ACT inhaler  Every 6 hours PRN         01/25/22 2143              Nehemiah Massed 01/25/22 2151    Noemi Chapel, MD 01/25/22 2239

## 2022-01-25 NOTE — ED Notes (Signed)
Pt d/c home per MD order. Discharge summary reviewed, pt verbalizes understanding. Ambulatory off unit with spouse. No s/s of acute distress noted at discharge.

## 2022-01-25 NOTE — Discharge Instructions (Addendum)
You have been seen today for your complaint of cough. Your lab work was positive for RSV. Your imaging was reassuring and showed no abnormalities. Your discharge medications include prednisone. This is a steroid. Take it as prescribed and for the entire duration of the prescription. I am also sending you a refill on your albuterol inhaler Home care instructions are as follows:  Drink plenty of water Follow up with: your primary care provider in one week for reevaluation Please seek immediate medical care if you develop any of the following symptoms: You have symptoms of a viral illness that do not go away. Your symptoms come back after going away. Your symptoms get worse. At this time there does not appear to be the presence of an emergent medical condition, however there is always the potential for conditions to change. Please read and follow the below instructions.  Do not take your medicine if  develop an itchy rash, swelling in your mouth or lips, or difficulty breathing; call 911 and seek immediate emergency medical attention if this occurs.  You may review your lab tests and imaging results in their entirety on your MyChart account.  Please discuss all results of fully with your primary care provider and other specialist at your follow-up visit.  Note: Portions of this text may have been transcribed using voice recognition software. Every effort was made to ensure accuracy; however, inadvertent computerized transcription errors may still be present.

## 2022-01-25 NOTE — ED Notes (Signed)
Patient transported to X-ray 

## 2022-01-25 NOTE — ED Triage Notes (Signed)
Pt reports cough, congestion, sob that started Wednesday, pt was seen at clinic and prescribed z-pack and dx with sinusitis. Pt took covid test at home, says it was negative. Reports feeling like she needs to cough up mucous but can't get it up.

## 2022-01-27 ENCOUNTER — Telehealth: Payer: Self-pay | Admitting: Family Medicine

## 2022-01-27 ENCOUNTER — Ambulatory Visit: Payer: Medicare Other

## 2022-01-27 NOTE — Telephone Encounter (Signed)
When symptoms have resolved

## 2022-01-27 NOTE — Telephone Encounter (Signed)
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 Said she went to ER and was told she has RSV

## 2022-01-28 ENCOUNTER — Telehealth: Payer: Self-pay | Admitting: *Deleted

## 2022-01-28 ENCOUNTER — Ambulatory Visit: Payer: Medicare Other

## 2022-01-28 ENCOUNTER — Encounter: Payer: Self-pay | Admitting: *Deleted

## 2022-01-28 NOTE — Patient Outreach (Signed)
  Care Coordination [facility] Note ED EMMI Alert Transition Care Management Follow-up Telephone Call Date of discharge and from where: [facility] ED 01/25/22 How have you been since you were released from the hospital? "I'm doing a lot better." Any questions or concerns? No  Items Reviewed: Did the pt receive and understand the discharge instructions provided? Yes  Medications obtained and verified? Yes . Using albuterol, prednisone, and cough medicine Other? Yes . Recommended mucinex and increase fluids Any new allergies since your discharge? No  Dietary orders reviewed? Yes Do you have support at home? Yes   Home Care and Equipment/Supplies: Were home health services ordered? no If so, what is the name of the agency?   Has the agency set up a time to come to the patient's home? not applicable Were any new equipment or medical supplies ordered?  No What is the name of the medical supply agency?  Were you able to get the supplies/equipment? not applicable Do you have any questions related to the use of the equipment or supplies? No  Functional Questionnaire: (I = Independent and D = Dependent) ADLs: I  Bathing/Dressing- I  Meal Prep- I  Eating- I  Maintaining continence- I  Transferring/Ambulation- I  Managing Meds- I  Follow up appointments reviewed:  PCP Hospital f/u appt confirmed? No . Not needed. Patient is feeling better. [facility] f/u appt confirmed?  Not indicated   Are transportation arrangements needed? No  If their condition worsens, is the pt aware to call PCP or go to the Emergency Dept.? Yes Was the patient provided with contact information for the PCP's office or ED? Yes Was to pt encouraged to call back with questions or concerns? Yes  SDOH assessments and interventions completed:   Yes SDOH Interventions Today    Flowsheet Row Most Recent Value  SDOH Interventions   Transportation Interventions Intervention Not Indicated       Care Coordination  Interventions:  Interventions outlined above    Encounter Outcome:  Pt. Visit Completed    Chong Sicilian, BSN, RN-BC RN Care Coordinator [place]: [phone] Main #: [phone]

## 2022-01-28 NOTE — Telephone Encounter (Signed)
Contacted patient. Notified patient. Patient verbalized understanding  °

## 2022-02-03 ENCOUNTER — Ambulatory Visit (INDEPENDENT_AMBULATORY_CARE_PROVIDER_SITE_OTHER): Payer: Medicare Other | Admitting: *Deleted

## 2022-02-03 DIAGNOSIS — Z23 Encounter for immunization: Secondary | ICD-10-CM | POA: Diagnosis not present

## 2022-02-03 DIAGNOSIS — Z111 Encounter for screening for respiratory tuberculosis: Secondary | ICD-10-CM

## 2022-02-03 NOTE — Progress Notes (Signed)
Flu shot given and quantiferon tb gold drawn by lab

## 2022-02-03 NOTE — Addendum Note (Signed)
Addended by: Baruch Gouty on: 02/03/2022 12:08 PM   Modules accepted: Orders

## 2022-02-05 LAB — QUANTIFERON-TB GOLD PLUS
QuantiFERON Mitogen Value: >10 IU/mL
QuantiFERON Nil Value: 0 IU/mL
QuantiFERON TB1 Ag Value: 0 IU/mL
QuantiFERON TB2 Ag Value: 0 IU/mL
QuantiFERON-TB Gold Plus: NEGATIVE

## 2022-02-12 ENCOUNTER — Ambulatory Visit: Payer: Medicare Other | Admitting: Family Medicine

## 2022-02-13 ENCOUNTER — Encounter: Payer: Self-pay | Admitting: Family Medicine

## 2022-02-13 ENCOUNTER — Ambulatory Visit (INDEPENDENT_AMBULATORY_CARE_PROVIDER_SITE_OTHER): Payer: Medicare Other | Admitting: Family Medicine

## 2022-02-13 VITALS — BP 124/67 | HR 57 | Temp 97.7°F | Ht 61.0 in | Wt 125.0 lb

## 2022-02-13 DIAGNOSIS — J4520 Mild intermittent asthma, uncomplicated: Secondary | ICD-10-CM | POA: Diagnosis not present

## 2022-02-13 DIAGNOSIS — K219 Gastro-esophageal reflux disease without esophagitis: Secondary | ICD-10-CM

## 2022-02-13 DIAGNOSIS — Z0184 Encounter for antibody response examination: Secondary | ICD-10-CM

## 2022-02-13 DIAGNOSIS — H579 Unspecified disorder of eye and adnexa: Secondary | ICD-10-CM

## 2022-02-13 HISTORY — PX: CARPOMETACARPAL (CMC) FUSION OF THUMB: SHX6290

## 2022-02-13 NOTE — Progress Notes (Signed)
Established Patient Office Visit  Subjective   Patient ID: Denise Benton, female    DOB: June 29, 1950  Age: 72 y.o. MRN: [id number]  Chief Complaint  Patient presents with   Medical Management of Chronic Issues    HPI GERD Compliant with medications - Yes Current medications - omeprazole 20 mg daily Hemoptysis - No Dysphagia or dyspepsia - No Water brash - No Red Flags (weight loss, hematochezia, melena, weight loss, early satiety, fevers, odynophagia, or persistent vomiting) - No  2. Asthma Intermittent use of albuterol. Denies daily shortness of breath, wheezing, cough. Symptoms are primarily related to seasonal allergies.   3.Vision Last exam was a few years ago. She wears glasses daily. She denies blurred vision.   4. HLD Recent LDL was 111. Diet controlled. She also works with a Physiological scientist twice a week.   She has started a CMA program and needs a physical and form signed for this. She also needs to have titers drawn for immunity status.   Past Medical History:  Diagnosis Date   Allergy    seasonal   Arthritis    Asthma    Duodenal ulcer    Gallstones    GERD (gastroesophageal reflux disease)    HLD (hyperlipidemia)    IBS (irritable bowel syndrome)    Kidney stones    Lumbosacral disc disease    L4-5, L5-S1   Osteopenia       ROS As per HPI.    Objective:     BP 124/67   Pulse (!) 57   Temp 97.7 F (36.5 C) (Temporal)   Ht 5\' 1"  (1.549 m)   Wt 125 lb (56.7 kg)   SpO2 98%   BMI 23.62 kg/m    Physical Exam Vitals and nursing note reviewed.  Constitutional:      General: She is not in acute distress.    Appearance: Normal appearance. She is not ill-appearing.  HENT:     Head: Normocephalic.     Right Ear: Tympanic membrane, ear canal and external ear normal.     Left Ear: Tympanic membrane, ear canal and external ear normal.     Nose: Nose normal.     Mouth/Throat:     Mouth: Mucous membranes are dry.     Pharynx: Oropharynx is  clear.  Eyes:     Extraocular Movements: Extraocular movements intact.     Conjunctiva/sclera: Conjunctivae normal.     Pupils: Pupils are equal, round, and reactive to light.  Neck:     Thyroid: No thyroid mass, thyromegaly or thyroid tenderness.  Cardiovascular:     Rate and Rhythm: Normal rate and regular rhythm.     Pulses: Normal pulses.     Heart sounds: Normal heart sounds. No murmur heard.    No friction rub. No gallop.  Pulmonary:     Effort: Pulmonary effort is normal.     Breath sounds: Normal breath sounds.  Abdominal:     General: Bowel sounds are normal. There is no distension.     Palpations: Abdomen is soft. There is no mass.     Tenderness: There is no abdominal tenderness. There is no guarding.  Musculoskeletal:     Cervical back: Normal range of motion and neck supple. No tenderness.     Right lower leg: No edema.     Left lower leg: No edema.  Skin:    General: Skin is warm and dry.     Capillary Refill: Capillary refill takes less  than 2 seconds.     Findings: No lesion or rash.  Neurological:     General: No focal deficit present.     Mental Status: She is alert and oriented to person, place, and time.     Cranial Nerves: No cranial nerve deficit.     Motor: No weakness.     Gait: Gait normal.  Psychiatric:        Mood and Affect: Mood normal.        Behavior: Behavior normal.        Thought Content: Thought content normal.        Judgment: Judgment normal.      No results found for any visits on 02/13/22.    The 10-year ASCVD risk score (Arnett DK, et al., 2019) is: 9.5%    Assessment & Plan:   Denise Benton was seen today for medical management of chronic issues.  Diagnoses and all orders for this visit:  Gastroesophageal reflux disease without esophagitis Well controlled on current regimen.   Mild intermittent asthma without complication Well controlled on current regimen.   Immunity status testing Labs pending. Needed for CMA program.   -     Measles/Mumps/Rubella Immunity -     Varicella zoster antibody, IgG  Abnormal vision screen Recommended ophthalmology exam.   Will completed physical form for CMA program when titers result.    The patient indicates understanding of these issues and agrees with the plan.  Denise Perking, FNP

## 2022-02-14 LAB — MEASLES/MUMPS/RUBELLA IMMUNITY
MUMPS ABS, IGG: 235 AU/mL (ref >10.9)
RUBEOLA AB, IGG: >300 AU/mL (ref >16.4)
Rubella Antibodies, IGG: 20.1 index (ref >0.99)

## 2022-02-14 LAB — VARICELLA ZOSTER ANTIBODY, IGG: Varicella zoster IgG: 591 index (ref >165)

## 2022-03-19 ENCOUNTER — Ambulatory Visit (INDEPENDENT_AMBULATORY_CARE_PROVIDER_SITE_OTHER): Payer: Medicare Other | Admitting: Family Medicine

## 2022-03-19 ENCOUNTER — Other Ambulatory Visit (INDEPENDENT_AMBULATORY_CARE_PROVIDER_SITE_OTHER): Payer: Medicare Other

## 2022-03-19 ENCOUNTER — Encounter: Payer: Self-pay | Admitting: Family Medicine

## 2022-03-19 VITALS — BP 137/81 | HR 65 | Temp 97.6°F | Ht 61.0 in | Wt 127.2 lb

## 2022-03-19 DIAGNOSIS — R4189 Other symptoms and signs involving cognitive functions and awareness: Secondary | ICD-10-CM

## 2022-03-19 DIAGNOSIS — R5381 Other malaise: Secondary | ICD-10-CM

## 2022-03-19 DIAGNOSIS — R001 Bradycardia, unspecified: Secondary | ICD-10-CM

## 2022-03-19 DIAGNOSIS — R5383 Other fatigue: Secondary | ICD-10-CM | POA: Diagnosis not present

## 2022-03-19 NOTE — Progress Notes (Signed)
Subjective:  Patient ID: Denise Benton, female    DOB: 1951/01/13, 72 y.o.   MRN: [id number]  Patient Care Team: Gwenlyn Perking, FNP as PCP - General (Family Medicine) Noralyn Pick, NP as Nurse Practitioner (Gastroenterology) Sandi Carne, MD (Chiropractic Medicine) Tanda Rockers, MD as Consulting Physician (Pulmonary Disease) Rozetta Nunnery, MD (Inactive) as Consulting Physician (Otolaryngology) Star Age, MD as Attending Physician (Neurology) Franchot Gallo, MD as Consulting Physician (Urology)   Chief Complaint:  Hypertension (Running high in the mornings with headaches for the last several months )   HPI: Denise Benton is a 72 y.o. female presenting on 03/19/2022 for Hypertension (Running high in the mornings with headaches for the last several months )   Pt presents today for evaluation of elevated blood pressure and low heart rate. She states she checks her vitals every morning before getting out of bed and notices that her HR is low and sometimes her blood pressure is elevated. She reports fatigue and malaise with brain fog. She denies chest pain, palpitations, shortness of breath, weakness, confusion, or syncope.      Relevant past medical, surgical, family, and social history reviewed and updated as indicated.  Allergies and medications reviewed and updated. Data reviewed: Chart in Epic.   Past Medical History:  Diagnosis Date   Allergy    seasonal   Arthritis    Asthma    Duodenal ulcer    Gallstones    GERD (gastroesophageal reflux disease)    HLD (hyperlipidemia)    IBS (irritable bowel syndrome)    Kidney stones    Lumbosacral disc disease    L4-5, L5-S1   Osteopenia     Past Surgical History:  Procedure Laterality Date   BREAST CYST ASPIRATION Right    pt has cysts aspiration done for lumps   CESAREAN SECTION  1980   x2; again in [place]  07/2011   from  scalp - keratosis    Social History   Socioeconomic History   Marital status: Married    Spouse name: Jeneen Rinks    Number of children: 2   Years of education: 4 years college   Highest education level: Bachelor's degree (e.g., BA, AB, BS)  Occupational History   Occupation: Retired RN/NP   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  (06/12/2021)   Overall Financial Resource Strain (CARDIA)    Difficulty of Paying Living Expenses: Not hard at all  Food Insecurity: No Food Insecurity (06/12/2021)   Hunger Vital Sign    Worried About Running Out of Food in the Last Year: Never true    [place] in the Last Year: Never true  Transportation Needs: No Transportation Needs (01/28/2022)   PRAPARE - Hydrologist (Medical): No    Lack of Transportation (Non-Medical): No  Physical Activity: Sufficiently Active (06/12/2021)   Exercise Vital Sign    Days of Exercise per Week: 5 days    Minutes of Exercise per Session: 30 min  Stress: No Stress Concern Present (06/12/2021)   [place]    Feeling of Stress : Not at all  Social Connections: [place] (06/12/2021)   Social Connection and Isolation Panel [NHANES]    Frequency of Communication with Friends and Family: More than three times a week     Frequency of Social Gatherings with Friends and Family: More than three times a week    Attends Religious Services: More than 4 times per year    Active Member of [employer] or Organizations: Yes    Attends Music therapist: More than 4 times per year    Marital Status: Married  Human resources officer Violence: Not At Risk (06/12/2021)   Humiliation, Afraid, Rape, and Kick questionnaire    Fear of Current or Ex-Partner: No    Emotionally Abused: No    Physically Abused: No    Sexually Abused: No    Outpatient Encounter Medications as of 03/19/2022  Medication Sig   acetaminophen (TYLENOL) 325 MG tablet Take 325 mg by mouth as needed.   albuterol (VENTOLIN HFA) 108 (90 Base) MCG/ACT inhaler Inhale 1-2 puffs into the lungs every 6 (six) hours as needed for wheezing or shortness of breath.   Arginine (L-ARGININE-500) 500 MG CAPS    aspirin (ASPIRIN 81) 81 MG chewable tablet    B Complex Vitamins (VITAMIN-B COMPLEX PO) Take 1 tablet by mouth daily.   Biotin 10000 MCG TABS Take by mouth daily.   Calcium-Vitamin D-Vitamin K (VIACTIV CALCIUM PLUS D) 650-12.5-40 MG-MCG-MCG CHEW    chlorhexidine (PERIDEX) 0.12 % solution SMARTSIG:15 By Mouth Morning-Evening   Cholecalciferol (VITAMIN D) 50 MCG (2000 UT) CAPS Take by mouth daily.    fluticasone-salmeterol (ADVAIR HFA) 115-21 MCG/ACT inhaler Inhale 2 puffs into the lungs 2 (two) times daily. As needed   folic acid (FOLVITE) Q000111Q MCG tablet Take 400 mcg by mouth once a week.    ibuprofen (ADVIL) 600 MG tablet Take 1 tablet (600 mg total) by mouth every 8 (eight) hours as needed for moderate pain.   levOCARNitine-E-Coenzyme Q10 (CO Q-10 PLUS L-CARNITINE PO) Take 1 tablet by mouth daily.   Omega-3 Fatty Acids (EPA PO) Take 1 tablet by mouth 3 (three) times a week.    [DISCONTINUED] Docosahexaenoic Acid (DHA PO) Take 1 tablet by mouth once a week.    [DISCONTINUED] ELDERBERRY PO Take 1 Dose by mouth as needed.   [DISCONTINUED] Multiple  Vitamins-Minerals (EMERGEN-C IMMUNE PO) Take 1 tablet by mouth daily.   [DISCONTINUED] omeprazole (PRILOSEC OTC) 20 MG tablet Take 20 mg by mouth daily.   No facility-administered encounter medications on file as of 03/19/2022.    Allergies  Allergen Reactions   Ivp Dye [Iodinated Contrast Media] Other (See Comments)    Respiratory distress   Covid-19 (Mrna) Vaccine     Flashing lights in visit, headaches, arm numbness, inability to move Flashing lights in visit, headaches, arm numbness, inability to move Flashing lights in visit, headaches, arm numbness, inability to move  Flashing lights in visit, headaches, arm numbness, inability to move Flashing lights in visit, headaches, arm numbness, inability to move  Codeine Nausea Only   Niaspan [Niacin Er] Rash and Other (See Comments)    Nightmares    Review of Systems  Constitutional:  Positive for fatigue. Negative for activity change, appetite change, chills, diaphoresis, fever and unexpected weight change.  HENT: Negative.    Eyes: Negative.  Negative for photophobia and visual disturbance.  Respiratory:  Negative for cough and chest tightness.   Cardiovascular:  Negative for chest pain, palpitations and leg swelling.       Bradycardia, elevated blood pressure.   Gastrointestinal:  Negative for abdominal pain, blood in stool, constipation, diarrhea, nausea and vomiting.  Endocrine: Negative.  Negative for polydipsia, polyphagia and polyuria.  Genitourinary:  Negative for decreased urine volume, difficulty urinating, dysuria, frequency and urgency.  Musculoskeletal:  Negative for arthralgias and myalgias.  Skin: Negative.   Allergic/Immunologic: Negative.   Neurological:  Positive for headaches. Negative for dizziness, tremors, seizures, syncope, facial asymmetry, speech difficulty, weakness, light-headedness and numbness.       Brain fog  Hematological: Negative.   Psychiatric/Behavioral:  Negative for confusion, hallucinations,  sleep disturbance and suicidal ideas.   All other systems reviewed and are negative.       Objective:  BP 137/81   Pulse 65   Temp 97.6 F (36.4 C) (Temporal)   Ht '5\' 1"'$  (1.549 m)   Wt 127 lb 3.2 oz (57.7 kg)   SpO2 98%   BMI 24.03 kg/m    Wt Readings from Last 3 Encounters:  03/19/22 127 lb 3.2 oz (57.7 kg)  02/13/22 125 lb (56.7 kg)  01/25/22 120 lb (54.4 kg)    Physical Exam Vitals and nursing note reviewed.  Constitutional:      General: She is not in acute distress.    Appearance: Normal appearance. She is well-developed, well-groomed and normal weight. She is not ill-appearing, toxic-appearing or diaphoretic.  HENT:     Head: Normocephalic and atraumatic.     Jaw: There is normal jaw occlusion.     Right Ear: Hearing normal.     Left Ear: Hearing normal.     Nose: Nose normal.     Mouth/Throat:     Lips: Pink.     Mouth: Mucous membranes are moist.     Pharynx: Uvula midline.  Eyes:     General: Lids are normal.     Pupils: Pupils are equal, round, and reactive to light.  Neck:     Thyroid: No thyroid mass, thyromegaly or thyroid tenderness.     Vascular: No carotid bruit or JVD.     Trachea: Trachea and phonation normal.  Cardiovascular:     Rate and Rhythm: Normal rate and regular rhythm.     Chest Wall: PMI is not displaced.     Pulses: Normal pulses.     Heart sounds: Normal heart sounds. No murmur heard.    No friction rub. No gallop.  Pulmonary:     Effort: Pulmonary effort is normal. No respiratory distress.     Breath sounds: Normal breath sounds. No wheezing.  Abdominal:     General: There is no abdominal bruit.     Palpations: There is no hepatomegaly or splenomegaly.  Musculoskeletal:        General: Normal range of motion.     Cervical back: Normal range of motion and neck supple.     Right lower leg: No edema.     Left lower leg: No edema.  Lymphadenopathy:     Cervical: No cervical adenopathy.  Skin:  General: Skin is warm and  dry.     Capillary Refill: Capillary refill takes less than 2 seconds.     Coloration: Skin is not cyanotic, jaundiced or pale.     Findings: No rash.  Neurological:     General: No focal deficit present.     Mental Status: She is alert and oriented to person, place, and time.     Sensory: Sensation is intact.     Motor: Motor function is intact.     Coordination: Coordination is intact.     Gait: Gait is intact.     Deep Tendon Reflexes: Reflexes are normal and symmetric.  Psychiatric:        Attention and Perception: Attention and perception normal.        Mood and Affect: Mood and affect normal.        Speech: Speech normal.        Behavior: Behavior normal. Behavior is cooperative.        Thought Content: Thought content normal.        Cognition and Memory: Cognition and memory normal.        Judgment: Judgment normal.     Results for orders placed or performed in visit on 02/13/22  Measles/Mumps/Rubella Immunity  Result Value Ref Range   Rubella Antibodies, IGG 20.10 Immune >0.99 index   RUBEOLA AB, IGG >300.0 Immune >16.4 AU/mL   MUMPS ABS, IGG 235.0 Immune >10.9 AU/mL  Varicella zoster antibody, IgG  Result Value Ref Range   Varicella zoster IgG 591 Immune >165 index       Pertinent labs & imaging results that were available during my care of the patient were reviewed by me and considered in my medical decision making.  Assessment & Plan:  Denise Benton was seen today for hypertension.  Diagnoses and all orders for this visit:  Bradycardia Malaise and fatigue Brain fog Intermittent episodes of HR in the 55-58 range, mostly in the am hours. Pt concerned this is attributing to her symptoms. Will place on Zio to evaluate for arrhythmia. Will check for possible anemia, thyroid dysfunction, or electrolyte disturbances. Report new, worsening, or persistent symptoms.  -     Anemia Profile B -     CMP14+EGFR -     Thyroid Panel With TSH -     LONG TERM MONITOR (3-14 DAYS);  Future     Continue all other maintenance medications.  Follow up plan: Return in about 6 weeks (around 04/30/2022), or if symptoms worsen or fail to improve, for BP.   Continue healthy lifestyle choices, including diet (rich in fruits, vegetables, and lean proteins, and low in salt and simple carbohydrates) and exercise (at least 30 minutes of moderate physical activity daily).  Educational handout given for bradycardia  The above assessment and management plan was discussed with the patient. The patient verbalized understanding of and has agreed to the management plan. Patient is aware to call the clinic if they develop any new symptoms or if symptoms persist or worsen. Patient is aware when to return to the clinic for a follow-up visit. Patient educated on when it is appropriate to go to the emergency department.   Monia Pouch, FNP-C [facility] [phone]

## 2022-03-20 LAB — ANEMIA PROFILE B
Basophils Absolute: 0 10*3/uL (ref 0.0–0.2)
Basos: 1 %
EOS (ABSOLUTE): 0.1 10*3/uL (ref 0.0–0.4)
Eos: 1 %
Ferritin: 272 ng/mL — ABNORMAL HIGH (ref 15–150)
Folate: 18.1 ng/mL (ref >3.0)
Hematocrit: 42.3 % (ref 34.0–46.6)
Hemoglobin: 14.2 g/dL (ref 11.1–15.9)
Immature Grans (Abs): 0 10*3/uL (ref 0.0–0.1)
Immature Granulocytes: 0 %
Iron Saturation: 45 % (ref 15–55)
Iron: 138 ug/dL (ref 27–139)
Lymphocytes Absolute: 2.5 10*3/uL (ref 0.7–3.1)
Lymphs: 36 %
MCH: 31.6 pg (ref 26.6–33.0)
MCHC: 33.6 g/dL (ref 31.5–35.7)
MCV: 94 fL (ref 79–97)
Monocytes Absolute: 0.7 10*3/uL (ref 0.1–0.9)
Monocytes: 9 %
Neutrophils Absolute: 3.6 10*3/uL (ref 1.4–7.0)
Neutrophils: 53 %
Platelets: 348 10*3/uL (ref 150–450)
RBC: 4.5 x10E6/uL (ref 3.77–5.28)
RDW: 12.5 % (ref 11.7–15.4)
Retic Ct Pct: 1.7 % (ref 0.6–2.6)
Total Iron Binding Capacity: 307 ug/dL (ref 250–450)
UIBC: 169 ug/dL (ref 118–369)
Vitamin B-12: 575 pg/mL (ref 232–1245)
WBC: 6.9 10*3/uL (ref 3.4–10.8)

## 2022-03-20 LAB — CMP14+EGFR
ALT: 18 IU/L (ref 0–32)
AST: 25 IU/L (ref 0–40)
Albumin/Globulin Ratio: 1.9 (ref 1.2–2.2)
Albumin: 4.5 g/dL (ref 3.8–4.8)
Alkaline Phosphatase: 88 IU/L (ref 44–121)
BUN/Creatinine Ratio: 24 (ref 12–28)
BUN: 22 mg/dL (ref 8–27)
Bilirubin Total: 0.5 mg/dL (ref 0.0–1.2)
CO2: 24 mmol/L (ref 20–29)
Calcium: 9.5 mg/dL (ref 8.7–10.3)
Chloride: 103 mmol/L (ref 96–106)
Creatinine, Ser: 0.93 mg/dL (ref 0.57–1.00)
Globulin, Total: 2.4 g/dL (ref 1.5–4.5)
Glucose: 70 mg/dL (ref 70–99)
Potassium: 4.5 mmol/L (ref 3.5–5.2)
Sodium: 143 mmol/L (ref 134–144)
Total Protein: 6.9 g/dL (ref 6.0–8.5)
eGFR: 66 mL/min/{1.73_m2} (ref >59)

## 2022-03-20 LAB — THYROID PANEL WITH TSH
Free Thyroxine Index: 1.7 (ref 1.2–4.9)
T3 Uptake Ratio: 27 % (ref 24–39)
T4, Total: 6.4 ug/dL (ref 4.5–12.0)
TSH: 4.02 u[IU]/mL (ref 0.450–4.500)

## 2022-04-15 ENCOUNTER — Other Ambulatory Visit (HOSPITAL_COMMUNITY): Payer: Self-pay | Admitting: Family Medicine

## 2022-04-15 DIAGNOSIS — Z1231 Encounter for screening mammogram for malignant neoplasm of breast: Secondary | ICD-10-CM

## 2022-04-16 ENCOUNTER — Ambulatory Visit (INDEPENDENT_AMBULATORY_CARE_PROVIDER_SITE_OTHER): Payer: Medicare Other | Admitting: Family Medicine

## 2022-04-16 ENCOUNTER — Encounter: Payer: Self-pay | Admitting: Family Medicine

## 2022-04-16 VITALS — BP 110/65 | HR 74 | Ht 61.0 in | Wt 128.0 lb

## 2022-04-16 DIAGNOSIS — R399 Unspecified symptoms and signs involving the genitourinary system: Secondary | ICD-10-CM

## 2022-04-16 LAB — URINALYSIS
Bilirubin, UA: NEGATIVE
Glucose, UA: NEGATIVE
Leukocytes,UA: NEGATIVE
Nitrite, UA: POSITIVE — AB
Protein,UA: NEGATIVE
Specific Gravity, UA: >1.03 — ABNORMAL HIGH (ref 1.005–1.030)
Urobilinogen, Ur: 1 mg/dL (ref 0.2–1.0)
pH, UA: 5 (ref 5.0–7.5)

## 2022-04-16 MED ORDER — CEPHALEXIN 500 MG PO CAPS: 500.0000 mg | ORAL_CAPSULE | Freq: Four times a day (QID) | ORAL | 0 refills | Status: DC

## 2022-04-16 NOTE — Progress Notes (Signed)
BP 110/65   Pulse 74   Ht 5\' 1"  (1.549 m)   Wt 128 lb (58.1 kg)   SpO2 95%   BMI 24.19 kg/m    Subjective:   Patient ID: Denise Benton, female    DOB: Apr 07, 1950, 72 y.o.   MRN: [id number]  HPI: Denise Benton is a 72 y.o. female presenting on 04/16/2022 for Urinary Tract Infection   HPI Dysuria and frequency Patient is coming in today with complaints of dysuria and frequency hesitancy and lower pelvic pain and discomfort that is been going on for about a week but then all of a sudden yesterday it got worse and the pain was sharp and severe and across her lower abdomen and pelvic.  She has been having increased urinary frequency and retention.  She did take 600 ibuprofen and Pyridium yesterday and then it seems to have calm down some today and back to just the discomfort and the frequency and hesitancy.  She does have a history of renal stones and does not know if this could be related to 1 of those.  Relevant past medical, surgical, family and social history reviewed and updated as indicated. Interim medical history since our last visit reviewed. Allergies and medications reviewed and updated.  Review of Systems  Constitutional:  Negative for chills and fever.  Eyes:  Negative for visual disturbance.  Respiratory:  Negative for chest tightness and shortness of breath.   Cardiovascular:  Negative for chest pain and leg swelling.  Gastrointestinal:  Negative for abdominal pain.  Genitourinary:  Positive for dysuria, frequency, pelvic pain and urgency. Negative for difficulty urinating, hematuria, vaginal bleeding, vaginal discharge and vaginal pain.  Musculoskeletal:  Negative for back pain and gait problem.  Skin:  Negative for rash.  Neurological:  Negative for light-headedness and headaches.  Psychiatric/Behavioral:  Negative for agitation and behavioral problems.   All other systems reviewed and are negative.   Per HPI unless specifically indicated above   Allergies as  of 04/16/2022       Reactions   Ivp Dye [iodinated Contrast Media] Other (See Comments)   Respiratory distress   Covid-19 (mrna) Vaccine    Flashing lights in visit, headaches, arm numbness, inability to move Flashing lights in visit, headaches, arm numbness, inability to move Flashing lights in visit, headaches, arm numbness, inability to move Flashing lights in visit, headaches, arm numbness, inability to move Flashing lights in visit, headaches, arm numbness, inability to move   Codeine Nausea Only   Niaspan [niacin Er] Rash, Other (See Comments)   Nightmares        Medication List        Accurate as of April 16, 2022  2:22 PM. If you have any questions, ask your nurse or doctor.          acetaminophen 325 MG tablet Commonly known as: TYLENOL Take 325 mg by mouth as needed.   Advair HFA 115-21 MCG/ACT inhaler Generic drug: fluticasone-salmeterol Inhale 2 puffs into the lungs 2 (two) times daily. As needed   albuterol 108 (90 Base) MCG/ACT inhaler Commonly known as: VENTOLIN HFA Inhale 1-2 puffs into the lungs every 6 (six) hours as needed for wheezing or shortness of breath.   Aspirin 81 81 MG chewable tablet Generic drug: aspirin   Biotin 10000 MCG Tabs Take by mouth daily.   cephALEXin 500 MG capsule Commonly known as: KEFLEX Take 1 capsule (500 mg total) by mouth 4 (four) times daily. Started by: Fransisca Kaufmann  Alverna Fawley, MD   chlorhexidine 0.12 % solution Commonly known as: PERIDEX SMARTSIG:15 By Mouth Morning-Evening   CO Q-10 PLUS L-CARNITINE PO Take 1 tablet by mouth daily.   EPA PO Take 1 tablet by mouth 3 (three) times a week.   folic acid Q000111Q MCG tablet Commonly known as: FOLVITE Take 400 mcg by mouth once a week.   ibuprofen 600 MG tablet Commonly known as: ADVIL Take 1 tablet (600 mg total) by mouth every 8 (eight) hours as needed for moderate pain.   L-Arginine-500 500 MG Caps Generic drug: Arginine   Viactiv Calcium Plus D 650-12.5-40  MG-MCG-MCG Chew Generic drug: Calcium-Vitamin D-Vitamin K   Vitamin D 50 MCG (2000 UT) Caps Take by mouth daily.   VITAMIN-B COMPLEX PO Take 1 tablet by mouth daily.         Objective:   BP 110/65   Pulse 74   Ht 5\' 1"  (1.549 m)   Wt 128 lb (58.1 kg)   SpO2 95%   BMI 24.19 kg/m   Wt Readings from Last 3 Encounters:  04/16/22 128 lb (58.1 kg)  03/19/22 127 lb 3.2 oz (57.7 kg)  02/13/22 125 lb (56.7 kg)    Physical Exam Vitals and nursing note reviewed.  Constitutional:      General: She is not in acute distress.    Appearance: She is well-developed. She is not diaphoretic.  Eyes:     Conjunctiva/sclera: Conjunctivae normal.  Abdominal:     General: Abdomen is flat. Bowel sounds are normal. There is no distension.     Palpations: Abdomen is soft.     Tenderness: There is abdominal tenderness in the right upper quadrant, epigastric area, suprapubic area and left upper quadrant. There is no guarding or rebound.  Skin:    General: Skin is warm and dry.     Findings: No rash.  Neurological:     Mental Status: She is alert and oriented to person, place, and time.     Coordination: Coordination normal.  Psychiatric:        Behavior: Behavior normal.       Assessment & Plan:   Problem List Items Addressed This Visit   None Visit Diagnoses     UTI symptoms    -  Primary   Relevant Medications   cephALEXin (KEFLEX) 500 MG capsule   Other Relevant Orders   Urine Culture   Urinalysis       Patient normally has upper abdominal pain so that is not abnormal for her.  Urinalysis shows trace blood and trace ketones and nitrite positive.  Will go ahead and treat like infection but cannot rule out renal stone. Follow up plan: Return if symptoms worsen or fail to improve.  Counseling provided for all of the vaccine components Orders Placed This Encounter  Procedures   Urine Culture   Urinalysis    Caryl Pina, MD [facility] 04/16/2022, 2:22 PM

## 2022-04-17 ENCOUNTER — Telehealth: Payer: Self-pay | Admitting: *Deleted

## 2022-04-17 DIAGNOSIS — R399 Unspecified symptoms and signs involving the genitourinary system: Secondary | ICD-10-CM

## 2022-04-17 MED ORDER — TAMSULOSIN HCL 0.4 MG PO CAPS: 0.4000 mg | ORAL_CAPSULE | Freq: Every day | ORAL | 1 refills | Status: DC

## 2022-04-17 NOTE — Telephone Encounter (Signed)
Seen yesterday for UTI, today she feels worse. Lower lumbar pain, feels like her bladder is going to 'fall' out, nausea, HA. Feels like when she had kidney stones little over a year ago. Is taking Keflex, which she has taken before w/ no problems, Peridium and Anaprox for pain. 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

## 2022-04-17 NOTE — Telephone Encounter (Signed)
Patient aware and verbalized understanding. °

## 2022-04-17 NOTE — Addendum Note (Signed)
Addended by: Caryl Pina on: 04/17/2022 01:50 PM   Modules accepted: Orders

## 2022-04-17 NOTE — Telephone Encounter (Signed)
I sent Flomax for you, but some medicine that we commonly use if somebody has a kidney stone to help with come out easier.  Keep taking the antibiotic as well.  Make sure you are using ibuprofen 800 or 600 mg consistently for this as well.

## 2022-04-18 LAB — URINE CULTURE: Organism ID, Bacteria: NO GROWTH

## 2022-04-30 ENCOUNTER — Encounter: Payer: Self-pay | Admitting: Family Medicine

## 2022-04-30 ENCOUNTER — Ambulatory Visit (INDEPENDENT_AMBULATORY_CARE_PROVIDER_SITE_OTHER): Payer: Medicare Other

## 2022-04-30 ENCOUNTER — Ambulatory Visit (INDEPENDENT_AMBULATORY_CARE_PROVIDER_SITE_OTHER): Payer: Medicare Other | Admitting: Family Medicine

## 2022-04-30 VITALS — BP 98/60 | HR 70 | Temp 97.9°F | Ht 61.0 in | Wt 128.1 lb

## 2022-04-30 DIAGNOSIS — K219 Gastro-esophageal reflux disease without esophagitis: Secondary | ICD-10-CM

## 2022-04-30 DIAGNOSIS — J4530 Mild persistent asthma, uncomplicated: Secondary | ICD-10-CM

## 2022-04-30 DIAGNOSIS — Z780 Asymptomatic menopausal state: Secondary | ICD-10-CM

## 2022-04-30 DIAGNOSIS — R110 Nausea: Secondary | ICD-10-CM | POA: Diagnosis not present

## 2022-04-30 DIAGNOSIS — R000 Tachycardia, unspecified: Secondary | ICD-10-CM

## 2022-04-30 DIAGNOSIS — R002 Palpitations: Secondary | ICD-10-CM

## 2022-04-30 DIAGNOSIS — R1013 Epigastric pain: Secondary | ICD-10-CM | POA: Diagnosis not present

## 2022-04-30 NOTE — Progress Notes (Unsigned)
Established Patient Office Visit  Subjective   Patient ID: Denise Benton, female    DOB: 06-14-1950  Age: 72 y.o. MRN: [id number]  Chief Complaint  Patient presents with   Gastroesophageal Reflux   Tachycardia    Gastroesophageal Reflux She complains of abdominal pain (epigastric, burning), coughing, heartburn and nausea. She reports no belching, no chest pain, no choking, no dysphagia, no early satiety, no globus sensation, no hoarse voice, no sore throat, no water brash or no wheezing. some shortnes of breath. This is a recurrent problem. Episode onset: 2-3 months ago. The problem has been gradually worsening. The heartburn is located in the substernum. The heartburn is of moderate intensity. The heartburn does not wake her from sleep. The symptoms are aggravated by lying down (eating, first thing in the morning). Pertinent negatives include no anemia, fatigue, melena, muscle weakness, orthopnea or weight loss. She has tried a PPI for the symptoms. The treatment provided significant relief. Past procedures include an EGD.  She has previously been on omeprazole with good relief but has not been on this for months. She was previously seen by GI- Dr. Meridee Score but not recently. She would like a referral back to GI.   Asthma She reports hx of asthma that was dx in [place]. She sometimes has shortness of breath with chest tightness with exercise. She denies wheezing. She reports that albuterol sometimes helps with symptoms but sometimes does not. She saw Dr. Sherene Sires here who told her that her symptoms were due to GERD and that she does not have asthma. She would like a referral to another pulmonologist for further evaluation.   Tachycardia She was recently seen by [facility] for palpitations. She wore a zio that showed multiple runs of SVT. She would like to be referred to cardiology in [place].   Past Medical History:  Diagnosis Date   Allergy    seasonal   Arthritis    Asthma    Duodenal ulcer     Gallstones    GERD (gastroesophageal reflux disease)    HLD (hyperlipidemia)    IBS (irritable bowel syndrome)    Kidney stones    Lumbosacral disc disease    L4-5, L5-S1   Osteopenia       Review of Systems  Constitutional:  Negative for fatigue and weight loss.  HENT:  Negative for hoarse voice and sore throat.   Respiratory:  Positive for cough. Negative for choking and wheezing.   Cardiovascular:  Negative for chest pain.  Gastrointestinal:  Positive for abdominal pain (epigastric, burning), heartburn and nausea. Negative for dysphagia and melena.  Musculoskeletal:  Negative for muscle weakness.      Objective:     BP 98/60   Pulse 70   Temp 97.9 F (36.6 C) (Temporal)   Ht  (1.549 m)   Wt 128 lb 2 oz (58.1 kg)   SpO2 98%   BMI 24.21 kg/m    Physical Exam Vitals and nursing note reviewed.  Constitutional:      General: She is not in acute distress.    Appearance: Normal appearance. She is not ill-appearing, toxic-appearing or diaphoretic.  Cardiovascular:     Rate and Rhythm: Normal rate and regular rhythm.     Heart sounds: Normal heart sounds. No murmur heard. Pulmonary:     Effort: Pulmonary effort is normal. No respiratory distress.     Breath sounds: Normal breath sounds.  Abdominal:     General: Abdomen is flat. Bowel sounds are normal.  There is no distension.     Palpations: Abdomen is soft.     Tenderness: There is abdominal tenderness in the epigastric area. There is no guarding or rebound. Negative signs include Murphy's sign.  Musculoskeletal:     Right lower leg: No edema.     Left lower leg: No edema.  Skin:    General: Skin is warm and dry.  Neurological:     General: No focal deficit present.     Mental Status: She is alert and oriented to person, place, and time.  Psychiatric:        Mood and Affect: Mood normal.        Behavior: Behavior normal.      No results found for any visits on 04/30/22.    The 10-year ASCVD risk  score (Arnett DK, et al., 2019) is: 6.1%    Assessment & Plan:   [place] was seen today for gastroesophageal reflux and tachycardia.  Diagnoses and all orders for this visit:  Gastroesophageal reflux disease without esophagitis Epigastric abdominal pain Nausea Discussed symptoms are consistent with uncontrolled GERD. Restart omeprazole 20 mg daily. Referral back to GI today.  -     Ambulatory referral to Gastroenterology  Mild persistent asthma without complication Referral to pulmonology regarding asthma dx.  -     Ambulatory referral to Pulmonology  Tachycardia Palpitations Zio with runs of SVT. Referral to cardiology placed.  -     Ambulatory referral to Cardiology  Post-menopausal -     DG [facility] DEXA; Future   Return if symptoms worsen or fail to improve.   The patient indicates understanding of these issues and agrees with the plan.  Gabriel Earing, FNP

## 2022-05-01 ENCOUNTER — Telehealth: Payer: Self-pay | Admitting: Family Medicine

## 2022-05-02 ENCOUNTER — Telehealth: Payer: Self-pay | Admitting: Internal Medicine

## 2022-05-02 NOTE — Telephone Encounter (Signed)
Fine with me

## 2022-05-02 NOTE — Telephone Encounter (Signed)
ATC X1 LVM for patient to call the office back. Please schedule new patient apt. Dr. Judeth Horn has apt in early May

## 2022-05-02 NOTE — Assessment & Plan Note (Signed)
Onset of recurrent bronchitis in 1998 req short term pred/abx - started on med dose advair by [place] spring 2020  - 03/06/2020  After extensive coaching inhaler device,  effectiveness =    80% > continue Advair 115 2bid    All goals of chronic asthma control met including optimal function and elimination of symptoms with minimal need for rescue therapy.  Contingencies discussed in full including contacting this office immediately if not controlling the symptoms using the rule of two's.     F/u can be prn refills or they can be done by PCP 

## 2022-05-02 NOTE — Telephone Encounter (Signed)
Pt called the office. Stated to pt since she is a current pt of Dr. Sherene Sires, before we can schedule her an appt with another provider (Dr. Judeth Horn) for a second opinion, we need to get the approval from Dr. Sherene Sires that he is okay with her switching to another provider and she verbalized understanding.   Dr. Sherene Sires, please advise if you are okay with pt switching providers.

## 2022-05-02 NOTE — Telephone Encounter (Signed)
Received referral from PCP Harlow Mares). Pt is requesting to get a 2nd opinion. LOV 03/06/20 with MW. Please advise if pt can see another provider.

## 2022-05-05 NOTE — Telephone Encounter (Signed)
Spoke with patient. I have her scheduled with Dr. Judeth Horn in May. NFN

## 2022-05-19 ENCOUNTER — Ambulatory Visit (HOSPITAL_COMMUNITY)
Admission: RE | Admit: 2022-05-19 | Discharge: 2022-05-19 | Disposition: A | Discharge status: Home / Self Care | Payer: Medicare Other | Source: Ambulatory Visit | Attending: Family Medicine | Admitting: Family Medicine

## 2022-05-19 ENCOUNTER — Encounter: Payer: Self-pay | Admitting: Internal Medicine

## 2022-05-19 ENCOUNTER — Ambulatory Visit (INDEPENDENT_AMBULATORY_CARE_PROVIDER_SITE_OTHER): Payer: Medicare Other | Admitting: Internal Medicine

## 2022-05-19 ENCOUNTER — Encounter (HOSPITAL_COMMUNITY): Payer: Self-pay

## 2022-05-19 VITALS — BP 114/62 | HR 72 | Ht 61.0 in | Wt 127.8 lb

## 2022-05-19 DIAGNOSIS — R002 Palpitations: Secondary | ICD-10-CM

## 2022-05-19 DIAGNOSIS — R000 Tachycardia, unspecified: Secondary | ICD-10-CM | POA: Diagnosis present

## 2022-05-19 DIAGNOSIS — Z1231 Encounter for screening mammogram for malignant neoplasm of breast: Secondary | ICD-10-CM | POA: Diagnosis not present

## 2022-05-19 DIAGNOSIS — Z8249 Family history of ischemic heart disease and other diseases of the circulatory system: Secondary | ICD-10-CM

## 2022-05-19 DIAGNOSIS — I4729 Other ventricular tachycardia: Secondary | ICD-10-CM | POA: Diagnosis not present

## 2022-05-19 MED ORDER — METOPROLOL TARTRATE 25 MG PO TABS: 25.0000 mg | ORAL_TABLET | Freq: Two times a day (BID) | ORAL | 1 refills | Status: DC

## 2022-05-19 NOTE — Patient Instructions (Addendum)
Medication Instructions:  Your physician has recommended you make the following change in your medication:  Start metoprolol tartrate 25 mg twice daily Continue other medications the same  Labwork: none  Testing/Procedures: none  Follow-Up: Your physician recommends that you schedule a follow-up appointment in: 6 months  Any Other Special Instructions Will Be Listed Below (If Applicable).  If you need a refill on your cardiac medications before your next appointment, please call your pharmacy. 

## 2022-05-20 DIAGNOSIS — I4729 Other ventricular tachycardia: Secondary | ICD-10-CM | POA: Insufficient documentation

## 2022-05-20 DIAGNOSIS — R002 Palpitations: Secondary | ICD-10-CM | POA: Insufficient documentation

## 2022-05-20 DIAGNOSIS — Z8249 Family history of ischemic heart disease and other diseases of the circulatory system: Secondary | ICD-10-CM | POA: Insufficient documentation

## 2022-05-20 NOTE — Progress Notes (Signed)
Cardiology Office Note  Date: 05/20/2022   ID: Denise Benton, DOB [birth year], MRN [id number]  PCP:  Denise Earing, FNP  Cardiologist:  Denise Bicker, MD Electrophysiologist:  None   Reason for Office Visit: Evaluation of palpitations at the request of Denise Halt, FNP   History of Present Illness: Denise Benton is a 72 y.o. female known to have HLD was referred to cardiology clinic for evaluation of palpitations.  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 She has been having palpitations for quite a while, lasting for 10 to 15 minutes and underwent event monitor recent in 03/2022.  The event monitor is remarkable for NSR, 1 run of 7 beat NSVT, 12 runs of brief asymptomatic SVT (longest lasting 6.5 seconds). Less than 1% PAC and less than 1% PVC burden was noted.  I reviewed her medical records from [facility] since 2010.  She underwent event monitor which showed NSVT and no other major arrhythmias. Echocardiogram was within normal limits, normal LVEF and no valve abnormalities. She also underwent stress echo which was normal. Although she did have positive exercise tolerance test in 2012 but negative stress echo years later. She was scheduled to undergo CT calcium scoring which, to her knowledge, might be performed at [facility].  She informed me that she would reach out to them and get a CT calcium scoring report. She has a strong family history of CAD.  In addition, she also complains of SOB which she was told she [facility] but when she saw a pulmonologist here, Dr. Sherene Benton, he did not think she has asthma.  She wants to get this straightened out.  Otherwise denied any angina, syncope, leg swelling.  Does not drink coffee, alcohol, energy drinks and herbal supplements.  Denies smoking cigarettes, alcohol use and illicit  drug abuse.  Past Medical History:  Diagnosis Date   Allergy    seasonal   Arthritis    Asthma    Duodenal ulcer    Gallstones    GERD (gastroesophageal reflux disease)    HLD (hyperlipidemia)    IBS (irritable bowel syndrome)    Kidney stones    Lumbosacral disc disease    L4-5, L5-S1   Osteopenia     Past Surgical History:  Procedure Laterality Date   BREAST CYST ASPIRATION Right    pt has cysts aspiration done for lumps   CESAREAN SECTION  1980   x2; again in 1984   LAPAROSCOPIC CHOLECYSTECTOMY  2000   SKIN LESION EXCISION  07/2011   from scalp - keratosis    Current Outpatient Medications  Medication Sig Dispense Refill   acetaminophen (TYLENOL) 325 MG tablet Take 325 mg by mouth as needed.     albuterol (VENTOLIN HFA) 108 (90 Base) MCG/ACT inhaler Inhale 1-2 puffs into the lungs every 6 (six) hours as needed for wheezing or shortness of breath. 8 g 0   aspirin EC 81 MG tablet Take 81 mg by mouth daily. Swallow whole.     B Complex Vitamins (VITAMIN-B COMPLEX PO) Take 1 tablet by mouth daily.     Biotin 40981 MCG TABS Take by mouth daily.     chlorhexidine (PERIDEX) 0.12 % solution SMARTSIG:15 By Mouth Morning-Evening     DHEA 50 MG TABS Take 1 tablet by mouth every other day.     fluticasone-salmeterol (ADVAIR HFA)  115-21 MCG/ACT inhaler Inhale 2 puffs into the lungs 2 (two) times daily. As needed     folic acid (FOLVITE) 800 MCG tablet Take 400 mcg by mouth once a week.      ibuprofen (ADVIL) 600 MG tablet Take 1 tablet (600 mg total) by mouth every 8 (eight) hours as needed for moderate pain. 30 tablet 1   levOCARNitine-E-Coenzyme Q10 (CO Q-10 PLUS L-CARNITINE PO) Take 1 tablet by mouth daily.     metoprolol tartrate (LOPRESSOR) 25 MG tablet Take 1 tablet (25 mg total) by mouth 2 (two) times daily. 180 tablet 1   Multiple Vitamin (MULTIVITAMIN) tablet Take 2 tablets by mouth daily.     omeprazole (PRILOSEC OTC) 20 MG tablet Take 20 mg by mouth as needed.     No  current facility-administered medications for this visit.   Allergies:  Ivp dye [iodinated contrast media], Covid-19 (mrna) vaccine, Codeine, and Niaspan [niacin er]   Social History: The patient  reports that she quit smoking about 53 years ago. Her smoking use included cigarettes. She has a 0.50 pack-year smoking history. She has never used smokeless tobacco. She reports that she does not drink alcohol and does not use drugs.   Family History: The patient's family history includes Anxiety disorder in her daughter; Congestive Heart Failure in her father; Dementia in her mother; Diabetes in her father and paternal grandfather; Heart disease in her father and sister; Hyperlipidemia in her mother; Hypertension in her daughter; Kidney Stones in her sister; Multiple sclerosis in her sister; Skin cancer in her mother; Tremor in her mother and sister.   ROS:  Please see the history of present illness. Otherwise, complete review of systems is positive for none  All other systems are reviewed and negative.   Physical Exam: VS:  BP 114/62   Pulse 72   Ht 5\' 1"  (1.549 m)   Wt 127 lb 12.8 oz (58 kg)   SpO2 95%   BMI 24.15 kg/m , BMI Body mass index is 24.15 kg/m.  Wt Readings from Last 3 Encounters:  05/19/22 127 lb 12.8 oz (58 kg)  04/30/22 128 lb 2 oz (58.1 kg)  04/16/22 128 lb (58.1 kg)    General: Patient appears comfortable at rest. HEENT: Conjunctiva and lids normal, oropharynx clear with moist mucosa. Neck: Supple, no elevated JVP or carotid bruits, no thyromegaly. Lungs: Clear to auscultation, nonlabored breathing at rest. Cardiac: Regular rate and rhythm, no S3 or significant systolic murmur, no pericardial rub. Abdomen: Soft, nontender, no hepatomegaly, bowel sounds present, no guarding or rebound. Extremities: No pitting edema, distal pulses 2+. Skin: Warm and dry. Musculoskeletal: No kyphosis. Neuropsychiatric: Alert and oriented x3, affect grossly appropriate.  Recent  Labwork: 03/19/2022: ALT 18; AST 25; BUN 22; Creatinine, Ser 0.93; Hemoglobin 14.2; Platelets 348; Potassium 4.5; Sodium 143; TSH 4.020     Component Value Date/Time   CHOL 208 (H) 09/05/2021 0918   TRIG 110 09/05/2021 0918   HDL 78 09/05/2021 0918   CHOLHDL 2.7 09/05/2021 0918   LDLCALC 111 (H) 09/05/2021 0918    Other Studies Reviewed Today: I personally reviewed the medical records from [facility] which the patient brought with her today.  Assessment and Plan: Patient is a 72 year old F known to have HLD was referred to cardiology clinic for evaluation of palpitations.  # Palpitations -Event monitor from 03/2022 was remarkable for 1 run of 7 beat NSVT. Otherwise there are 12 brief asymptomatic SVT episodes. Event monitor at [facility] also showed NSVT, 1 episode and brief SVT episodes. Will start metoprolol tartrate 25 mg twice daily. Does not drink coffee, energy drinks, alcohol.  # Family history CAD -I reviewed the medical records at [facility].  Patient had positive exercise tolerance test in 2012 but negative stress echo years later.  She was scheduled to undergo CT calcium scoring test which she said she underwent at [facility]. She will try to get the report. If not, we will schedule her for CT calcium scoring of coronaries here in [place] any pain.    I have spent a total of 45 minutes with patient reviewing chart, EKGs, labs and examining patient as well as establishing an assessment and plan that was discussed with the patient.  > 50% of time was spent in direct patient care.    Medication Adjustments/Labs and Tests Ordered: Current medicines are reviewed at length with the patient today.  Concerns regarding medicines are outlined above.   Tests Ordered: Orders Placed This Encounter  Procedures   EKG 12-Lead    Medication Changes: Meds ordered this encounter  Medications    metoprolol tartrate (LOPRESSOR) 25 MG tablet    Sig: Take 1 tablet (25 mg total) by mouth 2 (two) times daily.    Dispense:  180 tablet    Refill:  1    Disposition:  Follow up  6 months  Signed Maddux Vanscyoc Verne Spurr, MD, 05/20/2022 8:58 AM    [facility] [address] [place], [place], [place] [address]

## 2022-05-22 ENCOUNTER — Ambulatory Visit (INDEPENDENT_AMBULATORY_CARE_PROVIDER_SITE_OTHER): Payer: Medicare Other | Admitting: Pulmonary Disease

## 2022-05-22 ENCOUNTER — Encounter: Payer: Self-pay | Admitting: Pulmonary Disease

## 2022-05-22 VITALS — BP 112/78 | HR 51 | Ht 61.0 in | Wt 129.9 lb

## 2022-05-22 DIAGNOSIS — J4540 Moderate persistent asthma, uncomplicated: Secondary | ICD-10-CM | POA: Diagnosis not present

## 2022-05-22 NOTE — Patient Instructions (Addendum)
It is nice to meet you  Given the variability of your symptoms I think asthma is likely the correct diagnosis  Use Advair 2 puffs twice a day, every day, rinse your mouth with water after every use  Use albuterol as needed  Return to clinic in 3 months or sooner as needed with Dr. Judeth Horn

## 2022-05-22 NOTE — Progress Notes (Signed)
@Patient  ID: Denise Benton, female    DOB: November 19, 1950, 72 y.o.   MRN: [id number]  Chief Complaint  Patient presents with   New Patient (Initial Visit)    Seen Dr.Wert 03/06/2020 SOB  ACT 17    Referring provider: Gabriel Earing, FNP  HPI:   72 y.o. woman whom we are seeing for evaluation of dyspnea on exertion.  Most recent pulmonary note Dr. Sherene Sires 2022 reviewed. Most recent cardiology note reviewed.  Most recent PCP note reviewed.  [place].  Placed on Advair and albuterol as needed.  Seem to help her symptoms.  Seen in a year and a half ago here.  She was told she did not have asthma.  She is not using inhalers regularly.  She describes variable degrees of dyspnea on exertion."  Shortness of breath at rest.  No rhyme or reason or pattern identified.  When this occurs no time of day when things are better or worse.  No position to make things better or worse.  No seasonal or environmental factors she can apply to make things better or worse.  She does think albuterol helps with the symptoms in 5 to 10 minutes.  Not regularly using albuterol any longer.  Reviewed most recent chest imaging, chest x-ray, 01/2022 that demonstrates clear lungs, signs of hyperinflation with flattened diaphragms, mild radiolucency between sternum and hilar structures on the lateral view on my interpretation.  [place]  This showed normal spirometry, no bronchodilator response, otherwise reassuring results.  Questionaires / Pulmonary Flowsheets:   ACT:  Asthma Control Test ACT Total Score  05/22/2022  1:19 PM 17    MMRC:     No data to display          Epworth:      No data to display          Tests:   FENO:  No results found for: "NITRICOXIDE"  PFT:     No data to display          WALK:      No data to display          Imaging: MM 3D SCREENING MAMMOGRAM BILATERAL BREAST  Result Date:  05/20/2022 CLINICAL DATA:  Screening. EXAM: DIGITAL SCREENING BILATERAL MAMMOGRAM WITH TOMOSYNTHESIS AND CAD TECHNIQUE: Bilateral screening digital craniocaudal and mediolateral oblique mammograms were obtained. Bilateral screening digital breast tomosynthesis was performed. The images were evaluated with computer-aided detection. COMPARISON:  Previous exam(s). ACR Breast Density Category b: There are scattered areas of fibroglandular density. FINDINGS: There are no findings suspicious for malignancy. IMPRESSION: No mammographic evidence of malignancy. A result letter of this screening mammogram will be mailed directly to the patient. RECOMMENDATION: Screening mammogram in one year. (Code:[id number]-B-01Y) BI-RADS CATEGORY  1: Negative. Electronically Signed   By: Elberta Fortis M.D.   On: 05/20/2022 13:07   DG [facility] DEXA  Result Date: 04/30/2022 EXAM: DUAL X-RAY ABSORPTIOMETRY (DXA) FOR BONE MINERAL DENSITY 04/30/2022 10:07 am CLINICAL DATA:  72 year old Female Postmenopausal. post menopausal TECHNIQUE: An axial (e.g., hips, spine) and/or appendicular (e.g., radius) exam was performed, as appropriate, using [employer] systems engineer at [facility]. Images are obtained for bone mineral density measurement and are not obtained for diagnostic purposes. [id number] Exclusions: None. COMPARISON:  01/28/2019. FINDINGS: Scan quality: Good. LUMBAR SPINE (L1-L4): BMD (in g/cm2): 1.193 T-score: 0.0 Z-score: 1.9 Rate of change from previous  exam: 2.8 % LEFT FEMORAL NECK: BMD (in g/cm2): 0.789 T-score: -1.8 Z-score: 0.1 LEFT TOTAL HIP: BMD (in g/cm2): 0.852 T-score: -1.2 Z-score: 0.5 RIGHT FEMORAL NECK: BMD (in g/cm2): 0.834 T-score: -1.5 Z-score: 0.4 RIGHT TOTAL HIP: BMD (in g/cm2): 0.897 T-score: -0.9 Z-score: 0.8 No significant rate of change (dual femur total mean) from previous exam. FRAX 10-YEAR PROBABILITY OF FRACTURE: 10-year fracture risk is performed using the [facility] FRAX  calculator based on patient-reported risk factors. Major osteoporotic fracture: 11.3% Hip fracture: 2.2% Other situations known to alter the reliability of the FRAX score should be considered when making treatment decisions, including chronic glucocorticoid use and past treatments. Further guidance on treatment can be found at the [facility]'s website [email or web address]://www.patton.com/. IMPRESSION: Osteopenia based on BMD. Fracture risk is increased. Increased risk is based on low BMD. RECOMMENDATIONS: 1. All patients should optimize calcium and vitamin D intake. 2. Consider FDA-approved medical therapies in postmenopausal women and men aged 4 years and older, based on the following: - A hip or vertebral (clinical or morphometric) fracture - T-score less than or equal to -2.5 and secondary causes have been excluded. - Low bone mass (T-score between -1.0 and -2.5) and a 10-year probability of a hip fracture greater than or equal to 3% or a 10-year probability of a major osteoporosis-related fracture greater than or equal to 20% based on the [place]-adapted WHO algorithm. - Clinician judgment and/or patient preferences may indicate treatment for people with 10-year fracture probabilities above or below these levels 3. Patients with diagnosis of osteoporosis or at high risk for fracture should have regular bone mineral density tests. For patients eligible for Medicare, routine testing is allowed once every 2 years. The testing frequency can be increased to one year for patients who have rapidly progressing disease, those who are receiving or discontinuing medical therapy to restore bone mass, or have additional risk factors. Electronically Signed   By: Romona Curls M.D.   On: 04/30/2022 10:26    Lab Results:  CBC    Component Value Date/Time   WBC 6.9 03/19/2022 1502   WBC 11.5 (H) 01/25/2022 2112   RBC 4.50 03/19/2022 1502   RBC 4.50 01/25/2022 2112   HGB 14.2 03/19/2022 1502   HCT 42.3 03/19/2022 1502    PLT 348 03/19/2022 1502   MCV 94 03/19/2022 1502   MCH 31.6 03/19/2022 1502   MCH 31.6 01/25/2022 2112   MCHC 33.6 03/19/2022 1502   MCHC 32.8 01/25/2022 2112   RDW 12.5 03/19/2022 1502   LYMPHSABS 2.5 03/19/2022 1502   MONOABS 1.1 (H) 01/25/2022 2112   EOSABS 0.1 03/19/2022 1502   BASOSABS 0.0 03/19/2022 1502    BMET    Component Value Date/Time   NA 143 03/19/2022 1502   K 4.5 03/19/2022 1502   CL 103 03/19/2022 1502   CO2 24 03/19/2022 1502   GLUCOSE 70 03/19/2022 1502   GLUCOSE 95 01/25/2022 2112   BUN 22 03/19/2022 1502   CREATININE 0.93 03/19/2022 1502   CALCIUM 9.5 03/19/2022 1502   GFRNONAA 56 (L) 01/25/2022 2112   GFRAA 71 11/09/2019 0823    BNP No results found for: "BNP"  ProBNP No results found for: "PROBNP"  Specialty Problems       Pulmonary Problems   Mild persistent asthma without complication    Onset of recurrent bronchitis in 1998 req short term pred/abx - started on med dose advair by [place] spring 2020  - 03/06/2020  After extensive coaching inhaler  device,  effectiveness =    80% > continue Advair 115 2bid        Allergies  Allergen Reactions   Ivp Dye [Iodinated Contrast Media] Other (See Comments)    Respiratory distress   Covid-19 (Mrna) Vaccine     Flashing lights in visit, headaches, arm numbness, inability to move Flashing lights in visit, headaches, arm numbness, inability to move Flashing lights in visit, headaches, arm numbness, inability to move  Flashing lights in visit, headaches, arm numbness, inability to move Flashing lights in visit, headaches, arm numbness, inability to move   Codeine Nausea Only   Niaspan [Niacin Er] Rash and Other (See Comments)    Nightmares    Immunization History  Administered Date(s) Administered   Fluad Quad(high Dose 65+) 11/08/2019, 12/25/2020, 02/03/2022   Hepatitis A 11/09/2007   Hepatitis A, Adult 11/09/2007, 01/17/2021   IPV 11/09/2007   Influenza, High Dose Seasonal PF 11/08/2019    Influenza,inj,quad, With Preservative 11/14/2018   Influenza-Unspecified 11/08/2019   PFIZER(Purple Top)SARS-COV-2 Vaccination 09/23/2019   Pneumococcal Conjugate-13 11/18/2016   Pneumococcal Polysaccharide-23 10/25/2018   Td 01/13/2005   Tdap 10/24/2008, 12/25/2020   Typhoid Live 11/09/2007    Past Medical History:  Diagnosis Date   Allergy    seasonal   Arthritis    Asthma    Duodenal ulcer    Gallstones    GERD (gastroesophageal reflux disease)    HLD (hyperlipidemia)    IBS (irritable bowel syndrome)    Kidney stones    Lumbosacral disc disease    L4-5, L5-S1   Osteopenia     Tobacco History: Social History   Tobacco Use  Smoking Status Former   Packs/day: 0.50   Years: 1.00   Additional pack years: 0.00   Total pack years: 0.50   Types: Cigarettes   Quit date: 01/13/1969   Years since quitting: 53.3  Smokeless Tobacco Never   Counseling given: Not Answered   Continue to not smoke  Outpatient Encounter Medications as of 05/22/2022  Medication Sig   acetaminophen (TYLENOL) 325 MG tablet Take 325 mg by mouth as needed.   albuterol (VENTOLIN HFA) 108 (90 Base) MCG/ACT inhaler Inhale 1-2 puffs into the lungs every 6 (six) hours as needed for wheezing or shortness of breath.   aspirin EC 81 MG tablet Take 81 mg by mouth daily. Swallow whole.   B Complex Vitamins (VITAMIN-B COMPLEX PO) Take 1 tablet by mouth daily.   Biotin 56433 MCG TABS Take by mouth daily.   chlorhexidine (PERIDEX) 0.12 % solution SMARTSIG:15 By Mouth Morning-Evening   DHEA 50 MG TABS Take 1 tablet by mouth every other day.   fluticasone-salmeterol (ADVAIR HFA) 115-21 MCG/ACT inhaler Inhale 2 puffs into the lungs 2 (two) times daily. As needed   folic acid (FOLVITE) 800 MCG tablet Take 400 mcg by mouth once a week.    ibuprofen (ADVIL) 600 MG tablet Take 1 tablet (600 mg total) by mouth every 8 (eight) hours as needed for moderate pain.   levOCARNitine-E-Coenzyme Q10 (CO Q-10 PLUS L-CARNITINE  PO) Take 1 tablet by mouth daily.   metoprolol tartrate (LOPRESSOR) 25 MG tablet Take 1 tablet (25 mg total) by mouth 2 (two) times daily.   Multiple Vitamin (MULTIVITAMIN) tablet Take 2 tablets by mouth daily.   omeprazole (PRILOSEC OTC) 20 MG tablet Take 20 mg by mouth as needed.   No facility-administered encounter medications on file as of 05/22/2022.     Review of Systems  Review of Systems  No chest pain with exertion.  No orthopnea or PND.  Comprehensive review of systems otherwise negative. Physical Exam  BP 112/78 (BP Location: Left Arm, Patient Position: Sitting, Cuff Size: Normal)   Pulse (!) 51   Ht 5\' 1"  (1.549 m)   Wt 129 lb 14.4 oz (58.9 kg)   SpO2 97%   BMI 24.54 kg/m   Wt Readings from Last 5 Encounters:  05/22/22 129 lb 14.4 oz (58.9 kg)  05/19/22 127 lb 12.8 oz (58 kg)  04/30/22 128 lb 2 oz (58.1 kg)  04/16/22 128 lb (58.1 kg)  03/19/22 127 lb 3.2 oz (57.7 kg)    BMI Readings from Last 5 Encounters:  05/22/22 24.54 kg/m  05/19/22 24.15 kg/m  04/30/22 24.21 kg/m  04/16/22 24.19 kg/m  03/19/22 24.03 kg/m     Physical Exam General: Sitting in chair, no acute distress Eyes: EOMI, no icterus Neck: Supple, no JVP Pulmonary: Clear, no work of breathing Cardiovascular warm no edema Abdomen: Nondistended, bowel sounds present MSK: No synovitis, no joint effusions Neuro: Normal gait, no weakness Psych: Normal mood, full affect   Assessment & Plan:   Asthma: Clinical diagnosis based on atopic symptoms, improvement in symptoms with bronchodilators/ICS/LABA, and variable nature of symptoms, sign of hyperinflation on chest x-ray.  Advised to resume Advair HFA mid dose 2 puffs twice a day every day.  Continue albuterol as needed.  Dyspnea on exertion: Suspect related to asthma as above.  PFTs for further evaluation.   Return in about 2 months (around 07/22/2022) for f/u Dr. Judeth Horn.   Karren Burly, MD 05/22/2022   This appointment required  40 minutes of patient care (this includes precharting, chart review, review of results, face-to-face care, etc.).

## 2022-05-28 ENCOUNTER — Other Ambulatory Visit: Payer: Self-pay | Admitting: Family Medicine

## 2022-06-03 ENCOUNTER — Ambulatory Visit (INDEPENDENT_AMBULATORY_CARE_PROVIDER_SITE_OTHER): Payer: Medicare Other | Admitting: Family Medicine

## 2022-06-03 ENCOUNTER — Encounter: Payer: Self-pay | Admitting: Family Medicine

## 2022-06-03 VITALS — BP 121/62 | HR 60 | Temp 98.7°F | Ht 61.0 in | Wt 128.0 lb

## 2022-06-03 DIAGNOSIS — N760 Acute vaginitis: Secondary | ICD-10-CM | POA: Diagnosis not present

## 2022-06-03 DIAGNOSIS — D229 Melanocytic nevi, unspecified: Secondary | ICD-10-CM | POA: Diagnosis not present

## 2022-06-03 LAB — WET PREP FOR TRICH, YEAST, CLUE
Clue Cell Exam: NEGATIVE
Trichomonas Exam: NEGATIVE
Yeast Exam: NEGATIVE

## 2022-06-03 MED ORDER — FLUCONAZOLE 150 MG PO TABS: 150.0000 mg | ORAL_TABLET | Freq: Once | ORAL | 0 refills | Status: AC

## 2022-06-03 NOTE — Progress Notes (Signed)
Subjective: CC:?yeast infection PCP: Gabriel Earing, FNP ZOX:[id number] Denise Benton is a 72 y.o. female presenting to clinic today for:  1. Vaginitis Patient reports onset 1 month ago.  This occurred after treatment for urinary tract infection.  She reports some pelvic pressure but no ongoing urinary symptoms.  She denies any cheesy discharge but notes that she had some thin discharge.  She tried douching with a water solution and this only minimally improved symptoms.  She does question perhaps mild prolapse as she does feel fullness in the vaginal area but does not report any overt prolapse.  She reports irritation on the external vagina.  No concern for STI.  No abnormal vaginal bleeding   ROS: Per HPI  Allergies  Allergen Reactions   Ivp Dye [Iodinated Contrast Media] Other (See Comments)    Respiratory distress   Covid-19 (Mrna) Vaccine     Flashing lights in visit, headaches, arm numbness, inability to move Flashing lights in visit, headaches, arm numbness, inability to move Flashing lights in visit, headaches, arm numbness, inability to move  Flashing lights in visit, headaches, arm numbness, inability to move Flashing lights in visit, headaches, arm numbness, inability to move   Codeine Nausea Only   Niaspan [Niacin Er] Rash and Other (See Comments)    Nightmares   Past Medical History:  Diagnosis Date   Allergy    seasonal   Arthritis    Asthma    Duodenal ulcer    Gallstones    GERD (gastroesophageal reflux disease)    HLD (hyperlipidemia)    IBS (irritable bowel syndrome)    Kidney stones    Lumbosacral disc disease    L4-5, L5-S1   Osteopenia     Current Outpatient Medications:    acetaminophen (TYLENOL) 325 MG tablet, Take 325 mg by mouth as needed., Disp: , Rfl:    albuterol (VENTOLIN HFA) 108 (90 Base) MCG/ACT inhaler, INHALE 1-2 PUFFS BY MOUTH EVERY 6 HOURS AS NEEDED FOR WHEEZE OR SHORTNESS OF BREATH, Disp: 8.5 each, Rfl: 0   aspirin EC 81 MG tablet, Take  81 mg by mouth daily. Swallow whole., Disp: , Rfl:    B Complex Vitamins (VITAMIN-B COMPLEX PO), Take 1 tablet by mouth daily., Disp: , Rfl:    Biotin 98119 MCG TABS, Take by mouth daily., Disp: , Rfl:    chlorhexidine (PERIDEX) 0.12 % solution, SMARTSIG:15 By Mouth Morning-Evening, Disp: , Rfl:    DHEA 50 MG TABS, Take 1 tablet by mouth every other day., Disp: , Rfl:    fluticasone-salmeterol (ADVAIR HFA) 115-21 MCG/ACT inhaler, Inhale 2 puffs into the lungs 2 (two) times daily. As needed, Disp: , Rfl:    folic acid (FOLVITE) 800 MCG tablet, Take 400 mcg by mouth once a week. , Disp: , Rfl:    ibuprofen (ADVIL) 600 MG tablet, Take 1 tablet (600 mg total) by mouth every 8 (eight) hours as needed for moderate pain., Disp: 30 tablet, Rfl: 1   levOCARNitine-E-Coenzyme Q10 (CO Q-10 PLUS L-CARNITINE PO), Take 1 tablet by mouth daily., Disp: , Rfl:    metoprolol tartrate (LOPRESSOR) 25 MG tablet, Take 1 tablet (25 mg total) by mouth 2 (two) times daily., Disp: 180 tablet, Rfl: 1   Multiple Vitamin (MULTIVITAMIN) tablet, Take 2 tablets by mouth daily., Disp: , Rfl:    omeprazole (PRILOSEC OTC) 20 MG tablet, Take 20 mg by mouth as needed., Disp: , Rfl:  Social History   Socioeconomic History   Marital status: Married  Spouse name: Fayrene Fearing    Number of children: 2   Years of education: 4 years college   Highest education level: Bachelor's degree (e.g., BA, AB, BS)  Occupational History   Occupation: Retired RN/NP   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  Tobacco Use   Smoking status: Former    Packs/day: 0.50    Years: 1.00    Additional pack years: 0.00    Total pack years: 0.50    Types: Cigarettes    Quit date: 01/13/1969    Years since quitting: 53.4   Smokeless tobacco: Never  Vaping Use   Vaping Use: Never used  Substance and Sexual Activity   Alcohol use: No   Drug use: No   Sexual activity: Not Currently  Other Topics Concern   Not on file  Social  History Narrative   Lives home with husband - daughter in [place] here from [place] in 2020   Social Determinants of Health   Financial Resource Strain: Low Risk  (06/12/2021)   Overall Financial Resource Strain (CARDIA)    Difficulty of Paying Living Expenses: Not hard at all  Food Insecurity: No Food Insecurity (06/12/2021)   Hunger Vital Sign    Worried About Running Out of Food in the Last Year: Never true    Ran Out of Food in the Last Year: Never true  Transportation Needs: No Transportation Needs (01/28/2022)   PRAPARE - Administrator, Civil Service (Medical): No    Lack of Transportation (Non-Medical): No  Physical Activity: Sufficiently Active (06/12/2021)   Exercise Vital Sign    Days of Exercise per Week: 5 days    Minutes of Exercise per Session: 30 min  Stress: No Stress Concern Present (06/12/2021)   [employer] of Occupational Health - Occupational Stress Questionnaire    Feeling of Stress : Not at all  Social Connections: Socially Integrated (06/12/2021)   Social Connection and Isolation Panel [NHANES]    Frequency of Communication with Friends and Family: More than three times a week    Frequency of Social Gatherings with Friends and Family: More than three times a week    Attends Religious Services: More than 4 times per year    Active Member of [employer] or Organizations: Yes    Attends Engineer, structural: More than 4 times per year    Marital Status: Married  Catering manager Violence: Not At Risk (06/12/2021)   Humiliation, Afraid, Rape, and Kick questionnaire    Fear of Current or Ex-Partner: No    Emotionally Abused: No    Physically Abused: No    Sexually Abused: No   Family History  Problem Relation Age of Onset   Tremor Mother    Hyperlipidemia Mother    Skin cancer Mother    Dementia Mother    Congestive Heart Failure Father    Diabetes Father    Heart disease Father        congestive heart failure    Multiple sclerosis Sister    Heart disease Sister    Kidney Stones Sister    Tremor Sister    Diabetes Paternal Grandfather    Hypertension Daughter    Anxiety disorder Daughter    Colon cancer Neg Hx    Esophageal cancer Neg Hx    Stomach cancer Neg Hx    Rectal cancer Neg Hx    Inflammatory bowel disease Neg Hx    Liver disease Neg  Hx    Pancreatic cancer Neg Hx    Sleep apnea Neg Hx     Objective: Office vital signs reviewed. BP 121/62   Pulse 60   Temp 98.7 F (37.1 C)   Ht 5\' 1"  (1.549 m)   Wt 128 lb (58.1 kg)   SpO2 97%   BMI 24.19 kg/m   Physical Examination:  General: Awake, alert, well nourished, No acute distress Skin: Right lower flank with pigmented skin lesion that has a nodular lesion just under it. GU: External vagina slightly atrophic.  She has no adhesions or sclerotic lesions noted.  No significant excoriation.  No appreciable vaginal discharge.  Her cervix is high and anterior.  No punctate lesions appreciated.  On bimanual exam I did not appreciate significant rectocele or cystocele.  Assessment/ Plan: 72 y.o. female   Acute vaginitis - Plan: WET PREP FOR TRICH, YEAST, CLUE  Atypical mole - Plan: Ambulatory referral to Dermatology  No appreciable yeast findings on sample but she did not have significant discharge.  Given ongoing irritation vaginally following a UTI treatment I am going to empirically treat her with Diflucan.  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  Cotton underwear, etc.  For the atypical mole referral to her dermatologist has been placed. ?  Basal cell carcinoma?  No orders of the defined types were placed in this encounter.  No orders of the defined types were placed in this encounter.    Raliegh Ip, DO [facility] [phone]

## 2022-06-16 ENCOUNTER — Ambulatory Visit (INDEPENDENT_AMBULATORY_CARE_PROVIDER_SITE_OTHER): Payer: Medicare Other

## 2022-06-16 VITALS — Ht 61.0 in | Wt 128.0 lb

## 2022-06-16 DIAGNOSIS — Z0000 Encounter for general adult medical examination without abnormal findings: Secondary | ICD-10-CM | POA: Diagnosis not present

## 2022-06-16 NOTE — Patient Instructions (Signed)
Denise Benton , Thank you for taking time to come for your Medicare Wellness Visit. I appreciate your ongoing commitment to your health goals. Please review the following plan we discussed and let me know if I can assist you in the future.   These are the goals we discussed:  Goals      DIET - INCREASE WATER INTAKE     Patient Stated     06/12/2021 AWV Goal: Improved Nutrition/Diet  Planning to see a personal trainer to get healthier by eating better and exercising - try to get back pain under control        This is a list of the screening recommended for you and due dates:  Health Maintenance  Topic Date Due   COVID-19 Vaccine (2 - Pfizer risk series) 05/16/2023*   Zoster (Shingles) Vaccine (1 of 2) 07/30/2023*   Flu Shot  08/14/2022   Mammogram  05/19/2023   Medicare Annual Wellness Visit  06/16/2023   Cologuard (Stool DNA test)  09/11/2024   DEXA scan (bone density measurement)  04/29/2025   DTaP/Tdap/Td vaccine (4 - Td or Tdap) 12/26/2030   Pneumonia Vaccine  Completed   Hepatitis C Screening  Completed   HPV Vaccine  Aged Out   Colon Cancer Screening  Discontinued  *Topic was postponed. The date shown is not the original due date.    Advanced directives: Advance directive discussed with you today. I have provided a copy for you to complete at home and have notarized. Once this is complete please bring a copy in to our office so we can scan it into your chart.   Conditions/risks identified: Aim for 30 minutes of exercise or brisk walking, 6-8 glasses of water, and 5 servings of fruits and vegetables each day.   Next appointment: Follow up in one year for your annual wellness visit    Preventive Care 65 Years and Older, Female Preventive care refers to lifestyle choices and visits with your health care provider that can promote health and wellness. What does preventive care include? A yearly physical exam. This is also called an annual well check. Dental exams once or twice  a year. Routine eye exams. Ask your health care provider how often you should have your eyes checked. Personal lifestyle choices, including: Daily care of your teeth and gums. Regular physical activity. Eating a healthy diet. Avoiding tobacco and drug use. Limiting alcohol use. Practicing safe sex. Taking low-dose aspirin every day. Taking vitamin and mineral supplements as recommended by your health care provider. What happens during an annual well check? The services and screenings done by your health care provider during your annual well check will depend on your age, overall health, lifestyle risk factors, and family history of disease. Counseling  Your health care provider may ask you questions about your: Alcohol use. Tobacco use. Drug use. Emotional well-being. Home and relationship well-being. Sexual activity. Eating habits. History of falls. Memory and ability to understand (cognition). Work and work Astronomer. Reproductive health. Screening  You may have the following tests or measurements: Height, weight, and BMI. Blood pressure. Lipid and cholesterol levels. These may be checked every 5 years, or more frequently if you are over 56 years old. Skin check. Lung cancer screening. You may have this screening every year starting at age 56 if you have a 30-pack-year history of smoking and currently smoke or have quit within the past 15 years. Fecal occult blood test (FOBT) of the stool. You may have this test every  year starting at age 33. Flexible sigmoidoscopy or colonoscopy. You may have a sigmoidoscopy every 5 years or a colonoscopy every 10 years starting at age 43. Hepatitis C blood test. Hepatitis B blood test. Sexually transmitted disease (STD) testing. Diabetes screening. This is done by checking your blood sugar (glucose) after you have not eaten for a while (fasting). You may have this done every 1-3 years. Bone density scan. This is done to screen for  osteoporosis. You may have this done starting at age 38. Mammogram. This may be done every 1-2 years. Talk to your health care provider about how often you should have regular mammograms. Talk with your health care provider about your test results, treatment options, and if necessary, the need for more tests. Vaccines  Your health care provider may recommend certain vaccines, such as: Influenza vaccine. This is recommended every year. Tetanus, diphtheria, and acellular pertussis (Tdap, Td) vaccine. You may need a Td booster every 10 years. Zoster vaccine. You may need this after age 3. Pneumococcal 13-valent conjugate (PCV13) vaccine. One dose is recommended after age 87. Pneumococcal polysaccharide (PPSV23) vaccine. One dose is recommended after age 73. Talk to your health care provider about which screenings and vaccines you need and how often you need them. This information is not intended to replace advice given to you by your health care provider. Make sure you discuss any questions you have with your health care provider. Document Released: 01/26/2015 Document Revised: 09/19/2015 Document Reviewed: 10/31/2014 Elsevier Interactive Patient Education  2017 [employer].  Fall Prevention in the Home Falls can cause injuries. They can happen to people of all ages. There are many things you can do to make your home safe and to help prevent falls. What can I do on the outside of my home? Regularly fix the edges of walkways and driveways and fix any cracks. Remove anything that might make you trip as you walk through a door, such as a raised step or threshold. Trim any bushes or trees on the path to your home. Use bright outdoor lighting. Clear any walking paths of anything that might make someone trip, such as rocks or tools. Regularly check to see if handrails are loose or broken. Make sure that both sides of any steps have handrails. Any raised decks and porches should have guardrails on  the edges. Have any leaves, snow, or ice cleared regularly. Use sand or salt on walking paths during winter. Clean up any spills in your garage right away. This includes oil or grease spills. What can I do in the bathroom? Use night lights. Install grab bars by the toilet and in the tub and shower. Do not use towel bars as grab bars. Use non-skid mats or decals in the tub or shower. If you need to sit down in the shower, use a plastic, non-slip stool. Keep the floor dry. Clean up any water that spills on the floor as soon as it happens. Remove soap buildup in the tub or shower regularly. Attach bath mats securely with double-sided non-slip rug tape. Do not have throw rugs and other things on the floor that can make you trip. What can I do in the bedroom? Use night lights. Make sure that you have a light by your bed that is easy to reach. Do not use any sheets or blankets that are too big for your bed. They should not hang down onto the floor. Have a firm chair that has side arms. You can use this  for support while you get dressed. Do not have throw rugs and other things on the floor that can make you trip. What can I do in the kitchen? Clean up any spills right away. Avoid walking on wet floors. Keep items that you use a lot in easy-to-reach places. If you need to reach something above you, use a strong step stool that has a grab bar. Keep electrical cords out of the way. Do not use floor polish or wax that makes floors slippery. If you must use wax, use non-skid floor wax. Do not have throw rugs and other things on the floor that can make you trip. What can I do with my stairs? Do not leave any items on the stairs. Make sure that there are handrails on both sides of the stairs and use them. Fix handrails that are broken or loose. Make sure that handrails are as long as the stairways. Check any carpeting to make sure that it is firmly attached to the stairs. Fix any carpet that is loose  or worn. Avoid having throw rugs at the top or bottom of the stairs. If you do have throw rugs, attach them to the floor with carpet tape. Make sure that you have a light switch at the top of the stairs and the bottom of the stairs. If you do not have them, ask someone to add them for you. What else can I do to help prevent falls? Wear shoes that: Do not have high heels. Have rubber bottoms. Are comfortable and fit you well. Are closed at the toe. Do not wear sandals. If you use a stepladder: Make sure that it is fully opened. Do not climb a closed stepladder. Make sure that both sides of the stepladder are locked into place. Ask someone to hold it for you, if possible. Clearly mark and make sure that you can see: Any grab bars or handrails. First and last steps. Where the edge of each step is. Use tools that help you move around (mobility aids) if they are needed. These include: Canes. Walkers. Scooters. Crutches. Turn on the lights when you go into a dark area. Replace any light bulbs as soon as they burn out. Set up your furniture so you have a clear path. Avoid moving your furniture around. If any of your floors are uneven, fix them. If there are any pets around you, be aware of where they are. Review your medicines with your doctor. Some medicines can make you feel dizzy. This can increase your chance of falling. Ask your doctor what other things that you can do to help prevent falls. This information is not intended to replace advice given to you by your health care provider. Make sure you discuss any questions you have with your health care provider. Document Released: 10/26/2008 Document Revised: 06/07/2015 Document Reviewed: 02/03/2014 Elsevier Interactive Patient Education  2017 [employer].

## 2022-06-16 NOTE — Progress Notes (Signed)
Subjective:   LELANIA DYKE is a 72 y.o. female who presents for Medicare Annual (Subsequent) preventive examination. I connected with  [name] on 06/16/22 by a audio enabled telemedicine application and verified that I am speaking with the correct person using two identifiers.  Patient Location: Home  Provider Location: Home Office  I discussed the limitations of evaluation and management by telemedicine. The patient expressed understanding and agreed to proceed.  Review of Systems     Cardiac Risk Factors include: advanced age (>51men, >75 women)     Objective:    Today's Vitals   06/16/22 0952  Weight: 128 lb (58.1 kg)  Height: 5\' 1"  (1.549 m)   Body mass index is 24.19 kg/m.     06/16/2022    9:55 AM 01/25/2022    8:01 PM 06/12/2021    9:54 AM 01/12/2021    7:24 PM 06/08/2020    9:07 AM 04/05/2019    9:39 AM  Advanced Directives  Does Patient Have a Medical Advance Directive? Yes No Yes No Yes Yes  Type of Estate agent of [place];Living will  Healthcare Power of [place];Living will  Healthcare Power of [place];Living will Healthcare Power of [place];Living will  Does patient want to make changes to medical advance directive?      No - Patient declined  Copy of Healthcare Power of Attorney in Chart? No - copy requested  No - copy requested  No - copy requested No - copy requested  Would patient like information on creating a medical advance directive?  No - Patient declined  Yes (ED - Information included in AVS)      Current Medications (verified) Outpatient Encounter Medications as of 06/16/2022  Medication Sig   acetaminophen (TYLENOL) 325 MG tablet Take 325 mg by mouth as needed.   albuterol (VENTOLIN HFA) 108 (90 Base) MCG/ACT inhaler INHALE 1-2 PUFFS BY MOUTH EVERY 6 HOURS AS NEEDED FOR WHEEZE OR SHORTNESS OF BREATH   aspirin EC 81 MG tablet Take 81 mg by mouth daily. Swallow whole.   B Complex Vitamins (VITAMIN-B COMPLEX PO)  Take 1 tablet by mouth daily.   Biotin 16109 MCG TABS Take by mouth daily.   chlorhexidine (PERIDEX) 0.12 % solution SMARTSIG:15 By Mouth Morning-Evening   DHEA 50 MG TABS Take 1 tablet by mouth every other day.   fluticasone-salmeterol (ADVAIR HFA) 115-21 MCG/ACT inhaler Inhale 2 puffs into the lungs 2 (two) times daily. As needed   folic acid (FOLVITE) 800 MCG tablet Take 400 mcg by mouth once a week.    ibuprofen (ADVIL) 600 MG tablet Take 1 tablet (600 mg total) by mouth every 8 (eight) hours as needed for moderate pain.   levOCARNitine-E-Coenzyme Q10 (CO Q-10 PLUS L-CARNITINE PO) Take 1 tablet by mouth daily.   metoprolol tartrate (LOPRESSOR) 25 MG tablet Take 1 tablet (25 mg total) by mouth 2 (two) times daily.   Multiple Vitamin (MULTIVITAMIN) tablet Take 2 tablets by mouth daily.   omeprazole (PRILOSEC OTC) 20 MG tablet Take 20 mg by mouth as needed.   No facility-administered encounter medications on file as of 06/16/2022.    Allergies (verified) Ivp dye [iodinated contrast media], Covid-19 (mrna) vaccine, Codeine, and Niaspan [niacin er]   History: Past Medical History:  Diagnosis Date   Allergy    seasonal   Arthritis    Asthma    Duodenal ulcer    Gallstones    GERD (gastroesophageal reflux disease)    HLD (hyperlipidemia)  IBS (irritable bowel syndrome)    Kidney stones    Lumbosacral disc disease    L4-5, L5-S1   Osteopenia    Past Surgical History:  Procedure Laterality Date   BREAST CYST ASPIRATION Right    pt has cysts aspiration done for lumps   CESAREAN SECTION  1980   x2; again in 1984   LAPAROSCOPIC CHOLECYSTECTOMY  2000   SKIN LESION EXCISION  07/2011   from scalp - keratosis   Family History  Problem Relation Age of Onset   Tremor Mother    Hyperlipidemia Mother    Skin cancer Mother    Dementia Mother    Congestive Heart Failure Father    Diabetes Father    Heart disease Father        congestive heart failure   Multiple sclerosis Sister     Heart disease Sister    Kidney Stones Sister    Tremor Sister    Diabetes Paternal Grandfather    Hypertension Daughter    Anxiety disorder Daughter    Colon cancer Neg Hx    Esophageal cancer Neg Hx    Stomach cancer Neg Hx    Rectal cancer Neg Hx    Inflammatory bowel disease Neg Hx    Liver disease Neg Hx    Pancreatic cancer Neg Hx    Sleep apnea Neg Hx    Social History   Socioeconomic History   Marital status: Married    Spouse name: Fayrene Fearing    Number of children: 2   Years of education: 4 years college   Highest education level: Bachelor's degree (e.g., BA, AB, BS)  Occupational History   Occupation: Retired RN/NP   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  Tobacco Use   Smoking status: Former    Packs/day: 0.50    Years: 1.00    Additional pack years: 0.00    Total pack years: 0.50    Types: Cigarettes    Quit date: 01/13/1969    Years since quitting: 53.4   Smokeless tobacco: Never  Vaping Use   Vaping Use: Never used  Substance and Sexual Activity   Alcohol use: No   Drug use: No   Sexual activity: Not Currently  Other Topics Concern   Not on file  Social History Narrative   Lives home with husband - daughter in [place] here from [place] in 2020   Social Determinants of Health   Financial Resource Strain: Low Risk  (06/16/2022)   Overall Financial Resource Strain (CARDIA)    Difficulty of Paying Living Expenses: Not hard at all  Food Insecurity: No Food Insecurity (06/16/2022)   Hunger Vital Sign    Worried About Running Out of Food in the Last Year: Never true    Ran Out of Food in the Last Year: Never true  Transportation Needs: No Transportation Needs (06/16/2022)   PRAPARE - Administrator, Civil Service (Medical): No    Lack of Transportation (Non-Medical): No  Physical Activity: Sufficiently Active (06/16/2022)   Exercise Vital Sign    Days of Exercise per Week: 5 days    Minutes of  Exercise per Session: 30 min  Stress: No Stress Concern Present (06/16/2022)   [employer] of Occupational Health - Occupational Stress Questionnaire    Feeling of Stress : Not at all  Social Connections: Socially Integrated (06/16/2022)   Social Connection and Isolation Panel [NHANES]    Frequency of Communication  with Friends and Family: More than three times a week    Frequency of Social Gatherings with Friends and Family: More than three times a week    Attends Religious Services: More than 4 times per year    Active Member of [employer] or Organizations: Yes    Attends Engineer, structural: More than 4 times per year    Marital Status: Married    Tobacco Counseling Counseling given: Not Answered   Clinical Intake:  Pre-visit preparation completed: Yes  Pain : No/denies pain     Nutritional Risks: None Diabetes: No  How often do you need to have someone help you when you read instructions, pamphlets, or other written materials from your doctor or pharmacy?: 1 - Never  Diabetic?no   Interpreter Needed?: No  Information entered by :: Renie Ora, LPN   Activities of Daily Living    06/16/2022    9:55 AM 06/13/2022   11:16 AM  In your present state of health, do you have any difficulty performing the following activities:  Hearing? 0 0  Vision? 0 0  Difficulty concentrating or making decisions? 0 0  Walking or climbing stairs? 0 0  Dressing or bathing? 0 0  Doing errands, shopping? 0 0  Preparing Food and eating ? N N  Using the Toilet? N N  In the past six months, have you accidently leaked urine? N N  Do you have problems with loss of bowel control? N N  Managing your Medications? N N  Managing your Finances? N N  Housekeeping or managing your Housekeeping? N N    Patient Care Team: Gabriel Earing, FNP as PCP - General (Family Medicine) Mallipeddi, Orion Modest, MD as PCP - Cardiology (Cardiology) Arnaldo Natal, NP as Nurse Practitioner  (Gastroenterology) Donnetta Hail, MD (Chiropractic Medicine) Nyoka Cowden, MD as Consulting Physician (Pulmonary Disease) Drema Halon, MD (Inactive) as Consulting Physician (Otolaryngology) Huston Foley, MD as Attending Physician (Neurology) Marcine Matar, MD as Consulting Physician (Urology)  Indicate any recent Medical Services you may have received from other than [facility] providers in the past year (date may be approximate).     Assessment:   This is a routine wellness examination for [place].  Hearing/Vision screen Vision Screening - Comments:: Wears rx glasses - up to date with routine eye exams with  Dr.Johnson   Dietary issues and exercise activities discussed: Current Exercise Habits: Home exercise routine, Type of exercise: walking;strength training/weights, Time (Minutes): 30, Frequency (Times/Week): 5, Weekly Exercise (Minutes/Week): 150, Intensity: Mild, Exercise limited by: None identified   Goals Addressed             This Visit's Progress    DIET - INCREASE WATER INTAKE         Depression Screen    06/16/2022    9:54 AM 06/03/2022    8:41 AM 04/30/2022    9:08 AM 04/16/2022    1:54 PM 12/09/2021    3:50 PM 10/01/2021   10:32 AM 09/05/2021    8:40 AM  PHQ 2/9 Scores  PHQ - 2 Score 0 0 1 0 1 2 1   PHQ- 9 Score 0 0 3 4 3 6 4     Fall Risk    06/16/2022    9:53 AM 06/13/2022   11:16 AM 06/03/2022    8:41 AM 04/30/2022    9:07 AM 04/16/2022    1:54 PM  Fall Risk   Falls in the past year? 0 0 0 0 0  Number falls in past yr: 0 0 0    Injury with Fall? 0 0 0    Risk for fall due to : No Fall Risks  No Fall Risks    Follow up Falls prevention discussed  Education provided      FALL RISK PREVENTION PERTAINING TO THE HOME:  Any stairs in or around the home? Yes  If so, are there any without handrails? No  Home free of loose throw rugs in walkways, pet beds, electrical cords, etc? Yes  Adequate lighting in your home to reduce risk of falls? Yes    ASSISTIVE DEVICES UTILIZED TO PREVENT FALLS:  Life alert? No  Use of a cane, walker or w/c? No  Grab bars in the bathroom? Yes  Shower chair or bench in shower? Yes  Elevated toilet seat or a handicapped toilet? Yes       12/04/2020    8:46 AM  MMSE - Mini Mental State Exam  Orientation to time 4  Orientation to Place 5  Registration 3  Attention/ Calculation 5  Recall 2  Language- name 2 objects 2  Language- repeat 1  Language- follow 3 step command 3  Language- read & follow direction 1  Write a sentence 1  Copy design 1  Total score 28        06/16/2022    9:55 AM 06/12/2021   10:02 AM 06/08/2020    8:50 AM 04/05/2019    9:45 AM  6CIT Screen  What Year? 0 points 0 points 0 points 0 points  What month? 0 points 0 points 0 points 0 points  What time? 0 points 0 points 0 points 0 points  Count back from 20 0 points 0 points 0 points 0 points  Months in reverse 0 points 0 points 0 points 0 points  Repeat phrase 0 points 0 points 2 points 0 points  Total Score 0 points 0 points 2 points 0 points    Immunizations Immunization History  Administered Date(s) Administered   Fluad Quad(high Dose 65+) 11/08/2019, 12/25/2020, 02/03/2022   Hepatitis A 11/09/2007   Hepatitis A, Adult 11/09/2007, 01/17/2021   IPV 11/09/2007   Influenza, High Dose Seasonal PF 11/08/2019   Influenza,inj,quad, With Preservative 11/14/2018   Influenza-Unspecified 11/08/2019   PFIZER(Purple Top)SARS-COV-2 Vaccination 09/23/2019   Pneumococcal Conjugate-13 11/18/2016   Pneumococcal Polysaccharide-23 10/25/2018   Td 01/13/2005   Tdap 10/24/2008, 12/25/2020   Typhoid Live 11/09/2007    TDAP status: Up to date  Flu Vaccine status: Up to date  Pneumococcal vaccine status: Up to date  Covid-19 vaccine status: Declined, Education has been provided regarding the importance of this vaccine but patient still declined. Advised may receive this vaccine at local pharmacy or Health Dept.or vaccine  clinic. Aware to provide a copy of the vaccination record if obtained from local pharmacy or Health Dept. Verbalized acceptance and understanding.  Qualifies for Shingles Vaccine? Yes   Zostavax completed No   Shingrix Completed?: No.    Education has been provided regarding the importance of this vaccine. Patient has been advised to call insurance company to determine out of pocket expense if they have not yet received this vaccine. Advised may also receive vaccine at local pharmacy or Health Dept. Verbalized acceptance and understanding.  Screening Tests Health Maintenance  Topic Date Due   COVID-19 Vaccine (2 - Pfizer risk series) 05/16/2023 (Originally 10/14/2019)   Zoster Vaccines- Shingrix (1 of 2) 07/30/2023 (Originally 07/16/1969)   INFLUENZA VACCINE  08/14/2022  MAMMOGRAM  05/19/2023   Medicare Annual Wellness (AWV)  06/16/2023   Fecal DNA (Cologuard)  09/11/2024   DEXA SCAN  04/29/2025   DTaP/Tdap/Td (4 - Td or Tdap) 12/26/2030   Pneumonia Vaccine 34+ Years old  Completed   Hepatitis C Screening  Completed   HPV VACCINES  Aged Out   Colonoscopy  Discontinued    Health Maintenance  There are no preventive care reminders to display for this patient.   Colorectal cancer screening: Type of screening: Cologuard. Completed 09/11/2021. Repeat every 3 years  Mammogram status: Completed 05/19/2022. Repeat every year  Bone Density status: Completed 04/30/2022. Results reflect: Bone density results: OSTEOPENIA. Repeat every 3 years.  Lung Cancer Screening: (Low Dose CT Chest recommended if Age 37-80 years, 30 pack-year currently smoking OR have quit w/in 15years.) does not qualify.   Lung Cancer Screening Referral: n/a  Additional Screening:  Hepatitis C Screening: does not qualify; Completed 03/07/2021  Vision Screening: Recommended annual ophthalmology exams for early detection of glaucoma and other disorders of the eye. Is the patient up to date with their annual eye exam?   Yes  Who is the provider or what is the name of the office in which the patient attends annual eye exams? Dr.Johnson  If pt is not established with a provider, would they like to be referred to a provider to establish care? No .   Dental Screening: Recommended annual dental exams for proper oral hygiene  Community Resource Referral / Chronic Care Management: CRR required this visit?  No   CCM required this visit?  No      Plan:     I have personally reviewed and noted the following in the patient's chart:   Medical and social history Use of alcohol, tobacco or illicit drugs  Current medications and supplements including opioid prescriptions. Patient is not currently taking opioid prescriptions. Functional ability and status Nutritional status Physical activity Advanced directives List of other physicians Hospitalizations, surgeries, and ER visits in previous 12 months Vitals Screenings to include cognitive, depression, and falls Referrals and appointments  In addition, I have reviewed and discussed with patient certain preventive protocols, quality metrics, and best practice recommendations. A written personalized care plan for preventive services as well as general preventive health recommendations were provided to patient.     Lorrene Reid, LPN   08/14/9560   Nurse Notes: none

## 2022-06-26 ENCOUNTER — Encounter: Payer: Self-pay | Admitting: Physician Assistant

## 2022-06-26 ENCOUNTER — Ambulatory Visit (INDEPENDENT_AMBULATORY_CARE_PROVIDER_SITE_OTHER): Payer: Medicare Other | Admitting: Physician Assistant

## 2022-06-26 VITALS — BP 118/62 | HR 86 | Ht 61.0 in | Wt 128.0 lb

## 2022-06-26 DIAGNOSIS — K219 Gastro-esophageal reflux disease without esophagitis: Secondary | ICD-10-CM

## 2022-06-26 DIAGNOSIS — R1013 Epigastric pain: Secondary | ICD-10-CM

## 2022-06-26 MED ORDER — ESOMEPRAZOLE MAGNESIUM 40 MG PO CPDR: 40.0000 mg | DELAYED_RELEASE_CAPSULE | Freq: Two times a day (BID) | ORAL | 2 refills | Status: DC

## 2022-06-26 NOTE — Patient Instructions (Signed)
We have sent the following medications to your pharmacy for you to pick up at your convenience: Nexium 40 mg daily 30-60 minutes before breakfast.  _______________________________________________________  If your blood pressure at your visit was 140/90 or greater, please contact your primary care physician to follow up on this.  _______________________________________________________  If you are age 72 or older, your body mass index should be between 23-30. Your Body mass index is 24.19 kg/m. If this is out of the aforementioned range listed, please consider follow up with your Primary Care Provider.  If you are age 21 or younger, your body mass index should be between 19-25. Your Body mass index is 24.19 kg/m. If this is out of the aformentioned range listed, please consider follow up with your Primary Care Provider.   ________________________________________________________  The  GI providers would like to encourage you to use [facility] to communicate with providers for non-urgent requests or questions.  Due to long hold times on the telephone, sending your provider a message by [facility] may be a faster and more efficient way to get a response.  Please allow 48 business hours for a response.  Please remember that this is for non-urgent requests.  _______________________________________________________

## 2022-06-26 NOTE — Progress Notes (Signed)
Chief Complaint: GERD  HPI:    Denise Benton is a 72 year old female with a past medical history as listed below including GERD and IBS, known to Dr. Meridee Score, who was referred to me by Gabriel Earing, FNP for a complaint of GERD.      2012 outside colonoscopy-repeat recommended in 10 years.    Fall of 2023 Cologuard negative per patient.    03/2019 EGD with gastritis and multiple gastric polyps and otherwise normal.  Biopsy showed reactive gastropathy and fundic gland polyps.    05/17/2019 patient seen in clinic by Dr. Meridee Score for reflux.  At that time discussed an element of LPR and referred to ENT.  She was continued on Nexium 40 mg daily and then to decrease dose to 20 mg daily.  She was worried about side effects from medicine.  Discussed surgical options for reflux which she did not want to pursue those at the time.    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  She has been having issues with reflux ever since she tried to come off of her Nexium completely.  Apparently has been battling reflux symptoms over the past couple of years since being seen here last and will typically buy an over-the-counter course of Omeprazole 20 mg and use it for 14 days and feel better and then when things come back she will use another 2-week course.  She is really trying to watch her diet but recently everything seems to give her problems.  She has epigastric pain radiating just under her diaphragm on both sides that is worse with eating.  Also a lot of bloating and things are just "terrible", apparently also put on inhalers for her asthma which she feels like exacerbates things.  Has also gained some weight recently which she thinks is irritating her reflux.  Continues to have some concerns about side effects from PPIs.    Denies fever, chills, weight loss, blood in her stool, nausea or vomiting.  Past Medical History:  Diagnosis Date   Allergy    seasonal   Arthritis    Asthma     Duodenal ulcer    Gallstones    GERD (gastroesophageal reflux disease)    HLD (hyperlipidemia)    IBS (irritable bowel syndrome)    Kidney stones    Lumbosacral disc disease    L4-5, L5-S1   Osteopenia     Past Surgical History:  Procedure Laterality Date   BREAST CYST ASPIRATION Right    pt has cysts aspiration done for lumps   CARPOMETACARPAL (CMC) FUSION OF THUMB  02/2022   CESAREAN SECTION  1980   x2; again in 1984   LAPAROSCOPIC CHOLECYSTECTOMY  2000   SKIN LESION EXCISION  07/2011   from scalp - keratosis    Current Outpatient Medications  Medication Sig Dispense Refill   acetaminophen (TYLENOL) 325 MG tablet Take 325 mg by mouth as needed.     albuterol (VENTOLIN HFA) 108 (90 Base) MCG/ACT inhaler INHALE 1-2 PUFFS BY MOUTH EVERY 6 HOURS AS NEEDED FOR WHEEZE OR SHORTNESS OF BREATH 8.5 each 0   aspirin EC 81 MG tablet Take 81 mg by mouth daily. Swallow whole.     B Complex Vitamins (VITAMIN-B COMPLEX PO) Take 1 tablet by mouth daily.     Biotin 32951 MCG TABS Take by mouth daily.     chlorhexidine (PERIDEX) 0.12 % solution SMARTSIG:15 By Mouth Morning-Evening     DHEA 50  MG TABS Take 1 tablet by mouth every other day.     fluticasone-salmeterol (ADVAIR HFA) 115-21 MCG/ACT inhaler Inhale 2 puffs into the lungs 2 (two) times daily. As needed     folic acid (FOLVITE) 800 MCG tablet Take 400 mcg by mouth once a week.      ibuprofen (ADVIL) 600 MG tablet Take 1 tablet (600 mg total) by mouth every 8 (eight) hours as needed for moderate pain. 30 tablet 1   levOCARNitine-E-Coenzyme Q10 (CO Q-10 PLUS L-CARNITINE PO) Take 1 tablet by mouth daily.     metoprolol tartrate (LOPRESSOR) 25 MG tablet Take 1 tablet (25 mg total) by mouth 2 (two) times daily. 180 tablet 1   Multiple Vitamin (MULTIVITAMIN) tablet Take 2 tablets by mouth daily.     No current facility-administered medications for this visit.    Allergies as of 06/26/2022 - Review Complete 06/26/2022  Allergen  Reaction Noted   Ivp dye [iodinated contrast media] Other (See Comments) 12/25/2011   Covid-19 (mrna) vaccine  12/31/2020   Codeine Nausea Only 12/25/2011   Niaspan [niacin er] Rash and Other (See Comments) 12/25/2011    Family History  Problem Relation Age of Onset   Tremor Mother    Hyperlipidemia Mother    Skin cancer Mother    Dementia Mother    Congestive Heart Failure Father    Diabetes Father    Heart disease Father        congestive heart failure   Multiple sclerosis Sister    Heart disease Sister    Kidney Stones Sister    Tremor Sister    Diabetes Paternal Grandfather    Hypertension Daughter    Anxiety disorder Daughter    Colon cancer Neg Hx    Esophageal cancer Neg Hx    Stomach cancer Neg Hx    Rectal cancer Neg Hx    Inflammatory bowel disease Neg Hx    Liver disease Neg Hx    Pancreatic cancer Neg Hx    Sleep apnea Neg Hx     Social History   Socioeconomic History   Marital status: Married    Spouse name: Fayrene Fearing    Number of children: 2   Years of education: 4 years college   Highest education level: Bachelor's degree (e.g., BA, AB, BS)  Occupational History   Occupation: Retired RN/NP   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  Tobacco Use   Smoking status: Former    Packs/day: 0.50    Years: 1.00    Additional pack years: 0.00    Total pack years: 0.50    Types: Cigarettes    Quit date: 01/13/1969    Years since quitting: 53.4   Smokeless tobacco: Never  Vaping Use   Vaping Use: Never used  Substance and Sexual Activity   Alcohol use: No   Drug use: No   Sexual activity: Not Currently  Other Topics Concern   Not on file  Social History Narrative   Lives home with husband - daughter in [place] here from [place] in 2020   Social Determinants of Health   Financial Resource Strain: Low Risk  (06/16/2022)   Overall Financial Resource Strain (CARDIA)    Difficulty of Paying Living Expenses: Not  hard at all  Food Insecurity: No Food Insecurity (06/16/2022)   Hunger Vital Sign    Worried About Running Out of Food in the Last Year: Never true    Ran Out  of Food in the Last Year: Never true  Transportation Needs: No Transportation Needs (06/16/2022)   PRAPARE - Administrator, Civil Service (Medical): No    Lack of Transportation (Non-Medical): No  Physical Activity: Sufficiently Active (06/16/2022)   Exercise Vital Sign    Days of Exercise per Week: 5 days    Minutes of Exercise per Session: 30 min  Stress: No Stress Concern Present (06/16/2022)   [employer] of Occupational Health - Occupational Stress Questionnaire    Feeling of Stress : Not at all  Social Connections: Socially Integrated (06/16/2022)   Social Connection and Isolation Panel [NHANES]    Frequency of Communication with Friends and Family: More than three times a week    Frequency of Social Gatherings with Friends and Family: More than three times a week    Attends Religious Services: More than 4 times per year    Active Member of [employer] or Organizations: Yes    Attends Engineer, structural: More than 4 times per year    Marital Status: Married  Catering manager Violence: Not At Risk (06/16/2022)   Humiliation, Afraid, Rape, and Kick questionnaire    Fear of Current or Ex-Partner: No    Emotionally Abused: No    Physically Abused: No    Sexually Abused: No    Review of Systems:    Constitutional: No weight loss, fever or chills Skin: No rash Cardiovascular: No chest pain   Respiratory: No SOB  Gastrointestinal: See HPI and otherwise negative Genitourinary: No dysuria  Neurological: No headache, dizziness or syncope Musculoskeletal: No new muscle or joint pain Hematologic: No bleeding  Psychiatric: No history of depression or anxiety   Physical Exam:  Vital signs: BP 118/62   Pulse 86   Ht 5\' 1"  (1.549 m)   Wt 128 lb (58.1 kg)   BMI 24.19 kg/m   Constitutional:   Pleasant  Caucasian female appears to be in NAD, Well developed, Well nourished, alert and cooperative Head:  Normocephalic and atraumatic. Eyes:   PEERL, EOMI. No icterus. Conjunctiva pink. Ears:  Normal auditory acuity. Neck:  Supple Throat: Oral cavity and pharynx without inflammation, swelling or lesion.  Respiratory: Respirations even and unlabored. Lungs clear to auscultation bilaterally.   No wheezes, crackles, or rhonchi.  Cardiovascular: Normal S1, S2. No MRG. Regular rate and rhythm. No peripheral edema, cyanosis or pallor.  Gastrointestinal:  Soft, nondistended, marked epigastric TTP with involuntary guarding,. Normal bowel sounds. No appreciable masses or hepatomegaly. Rectal:  Not performed.  Msk:  Symmetrical without gross deformities. Without edema, no deformity or joint abnormality.  Neurologic:  Alert and  oriented x4;  grossly normal neurologically.  Skin:   Dry and intact without significant lesions or rashes. Psychiatric:  Demonstrates good judgement and reason without abnormal affect or behaviors.  RELEVANT LABS AND IMAGING: CBC    Component Value Date/Time   WBC 6.9 03/19/2022 1502   WBC 11.5 (H) 01/25/2022 2112   RBC 4.50 03/19/2022 1502   RBC 4.50 01/25/2022 2112   HGB 14.2 03/19/2022 1502   HCT 42.3 03/19/2022 1502   PLT 348 03/19/2022 1502   MCV 94 03/19/2022 1502   MCH 31.6 03/19/2022 1502   MCH 31.6 01/25/2022 2112   MCHC 33.6 03/19/2022 1502   MCHC 32.8 01/25/2022 2112   RDW 12.5 03/19/2022 1502   LYMPHSABS 2.5 03/19/2022 1502   MONOABS 1.1 (H) 01/25/2022 2112   EOSABS 0.1 03/19/2022 1502   BASOSABS 0.0 03/19/2022 1502  CMP     Component Value Date/Time   NA 143 03/19/2022 1502   K 4.5 03/19/2022 1502   CL 103 03/19/2022 1502   CO2 24 03/19/2022 1502   GLUCOSE 70 03/19/2022 1502   GLUCOSE 95 01/25/2022 2112   BUN 22 03/19/2022 1502   CREATININE 0.93 03/19/2022 1502   CALCIUM 9.5 03/19/2022 1502   PROT 6.9 03/19/2022 1502   ALBUMIN 4.5 03/19/2022  1502   AST 25 03/19/2022 1502   ALT 18 03/19/2022 1502   ALKPHOS 88 03/19/2022 1502   BILITOT 0.5 03/19/2022 1502   GFRNONAA 56 (L) 01/25/2022 2112   GFRAA 71 11/09/2019 0823    Assessment: 1.  Epigastric pain/GERD: Previous EGD in 2021 with gastritis, patient has continued lingering symptoms since then worse over the past few months since being put on inhalers and gaining about 10 to 15 pounds; likely gastritis  Plan: 1.  Restarted the patient on Nexium 40 mg every morning, 30-60 minutes before breakfast prescribed #30 with 2 refills.  Discussed that she should stay on this for at least 4 to 6 weeks.  If it is not helping after the first couple of weeks she may need 40 mg twice daily, we could offer her a new prescription.  She will call and let me know.  Again very worried about side effects from this medicine so we will try lower dose first. 2.  Discussed side effects from PPIs and answered her questions. 3.  Reviewed antireflux diet and lifestyle modifications. 4.  Discussed weight loss which should also help. 5.  Again discussed surgical options for reflux in the future if she is worried about PPIs but she does not always pursue these at this time. 6.  Patient to follow in clinic with me in 2 to 3 months or sooner if necessary.  Hyacinth Meeker, PA-C [facility] 06/26/2022, 10:03 AM  Cc: Gabriel Earing, FNP

## 2022-06-26 NOTE — Progress Notes (Signed)
Attending Physician's Attestation   I have reviewed the chart.   I agree with the Advanced Practitioner's note, impression, and recommendations with any updates as below. If issues persist at follow-up, consider updated endoscopy and previous discussions about pH impedance testing and manometry prior to surgical evaluation for potential fundoplication needs.   Corliss Parish, MD [facility] Advanced Endoscopy Office # [phone]

## 2022-07-02 ENCOUNTER — Telehealth: Payer: Self-pay

## 2022-07-02 NOTE — Telephone Encounter (Signed)
Patient Advocate Encounter   Received notification that prior authorization for Esomeprazole Magnesium 40MG  dr capsules is required.   PA submitted on 07/02/22 Key [id number] Status is pending, awaiting clinical questions

## 2022-07-23 ENCOUNTER — Telehealth: Payer: Self-pay | Admitting: Internal Medicine

## 2022-07-23 NOTE — Telephone Encounter (Signed)
Pt c/o medication issue:  1. Name of Medication: metoprolol tartrate (LOPRESSOR) 25 MG tablet   2. How are you currently taking this medication (dosage and times per day)? As prescribed   3. Are you having a reaction (difficulty breathing--STAT)?   4. What is your medication issue? Patient is calling to see if she should change this medication due to BP still being elevated.  BP 151/75 HR 52 this morning

## 2022-07-24 ENCOUNTER — Other Ambulatory Visit: Payer: Self-pay | Admitting: Internal Medicine

## 2022-07-24 MED ORDER — CARVEDILOL 3.125 MG PO TABS: 3.1250 mg | ORAL_TABLET | Freq: Two times a day (BID) | ORAL | 5 refills | Status: DC

## 2022-07-24 NOTE — Telephone Encounter (Signed)
Spoke to patient and advised her of VM suggestions. Patient stopping Lopressor and starting coregg for BP

## 2022-07-24 NOTE — Telephone Encounter (Signed)
Pt calling back to f/u about her BP. Please advise.

## 2022-07-31 ENCOUNTER — Encounter: Payer: Self-pay | Admitting: Pulmonary Disease

## 2022-07-31 ENCOUNTER — Ambulatory Visit: Payer: Medicare Other | Admitting: Pulmonary Disease

## 2022-07-31 ENCOUNTER — Ambulatory Visit (INDEPENDENT_AMBULATORY_CARE_PROVIDER_SITE_OTHER): Payer: Medicare Other | Admitting: Pulmonary Disease

## 2022-07-31 VITALS — BP 114/70 | HR 59 | Ht 61.0 in | Wt 128.2 lb

## 2022-07-31 DIAGNOSIS — R0609 Other forms of dyspnea: Secondary | ICD-10-CM

## 2022-07-31 DIAGNOSIS — J4540 Moderate persistent asthma, uncomplicated: Secondary | ICD-10-CM

## 2022-07-31 LAB — PULMONARY FUNCTION TEST
DL/VA % pred: 107 %
DL/VA: 4.53 ml/min/mmHg/L
DLCO cor % pred: 76 %
DLCO cor: 13.32 ml/min/mmHg
DLCO unc % pred: 76 %
DLCO unc: 13.32 ml/min/mmHg
FEF 25-75 Post: 1.48 L/sec
FEF 25-75 Pre: 1.1 L/sec
FEF2575-%Change-Post: 33 %
FEF2575-%Pred-Post: 90 %
FEF2575-%Pred-Pre: 67 %
FEV1-%Change-Post: 5 %
FEV1-%Pred-Post: 72 %
FEV1-%Pred-Pre: 68 %
FEV1-Post: 1.39 L
FEV1-Pre: 1.32 L
FEV1FVC-%Change-Post: 7 %
FEV1FVC-%Pred-Pre: 102 %
FEV6-%Change-Post: -1 %
FEV6-%Pred-Post: 68 %
FEV6-%Pred-Pre: 69 %
FEV6-Post: 1.68 L
FEV6-Pre: 1.71 L
FEV6FVC-%Pred-Post: 105 %
FEV6FVC-%Pred-Pre: 105 %
FVC-%Change-Post: -1 %
FVC-%Pred-Post: 65 %
FVC-%Pred-Pre: 66 %
FVC-Post: 1.68 L
FVC-Pre: 1.71 L
Post FEV1/FVC ratio: 83 %
Post FEV6/FVC ratio: 100 %
Pre FEV1/FVC ratio: 77 %
Pre FEV6/FVC Ratio: 100 %
RV % pred: 76 %
RV: 1.59 L
TLC % pred: 72 %
TLC: 3.33 L

## 2022-07-31 NOTE — Patient Instructions (Signed)
Nice to see you again  No changes in medication  Once fall since then it may be worth resuming your Advair twice a day, 2 puffs twice a day.  No changes to medication  If things are not going well and need for liking the nebulizer solution please send me a message and we can prescribe this in the future  If breathing worsens we can always consider doing a CT scan in the future.  For now we will hold off.  Return to clinic in 6 months or sooner as needed with Dr. Judeth Horn

## 2022-07-31 NOTE — Progress Notes (Signed)
Full PFT performed today. °

## 2022-07-31 NOTE — Progress Notes (Signed)
@Patient  ID: Denise Benton, female    DOB: 1950/11/22, 72 y.o.   MRN: [id number]  Chief Complaint  Patient presents with   Follow-up    F/U after PFT. States her breathing has improved since it is no longer pollen season. Still using her Advair inhaler.     Referring provider: Gabriel Earing, FNP  HPI:   72 y.o. woman whom we are seeing for evaluation of dyspnea on exertion.  Most recent GI note reviewed.  Most recent PCP note reviewed.  Returns for scheduled follow-up.  Overall doing well.  Notes that with the heat not as much dust and pollen so breathing has been doing well.  Not using Advair routinely.  Only, as needed.  That her rescue inhaler if she has some significant issues.  But this seems relatively rare over the last couple months.  Notes that typically things worsen in the fall through the spring.  Nose mold and spores are typical triggers but given current dormancy and multiple plants on an issue.  Perform PFTs today.  Reviewed in detail.  No fixed obstruction.  Mild restriction just below lower limit of normal DLCO just above the lower limit of normal.  Discussed consideration of possible ILD.  Notably chest imaging historically has been clear.  But no recent cross-sectional imaging.  Given her relative improvement in symptoms after shared decision making decided to hold off on additional imaging at this time.  HPI at initial visit: [place].  Placed on Advair and albuterol as needed.  Seem to help her symptoms.  Seen in a year and a half ago here.  She was told she did not have asthma.  She is not using inhalers regularly.  She describes variable degrees of dyspnea on exertion."  Shortness of breath at rest.  No rhyme or reason or pattern identified.  When this occurs no time of day when things are better or worse.  No position to make things better or worse.  No seasonal or environmental factors she can apply to make  things better or worse.  She does think albuterol helps with the symptoms in 5 to 10 minutes.  Not regularly using albuterol any longer.  Reviewed most recent chest imaging, chest x-ray, 01/2022 that demonstrates clear lungs, signs of hyperinflation with flattened diaphragms, mild radiolucency between sternum and hilar structures on the lateral view on my interpretation.  [place]  This showed normal spirometry, no bronchodilator response, otherwise reassuring results.  Questionaires / Pulmonary Flowsheets:   ACT:  Asthma Control Test ACT Total Score  05/22/2022  1:19 PM 17    MMRC:     No data to display          Epworth:      No data to display          Tests:   FENO:  No results found for: "NITRICOXIDE"  PFT:    Latest Ref Rng & Units 07/31/2022    3:01 PM  PFT Results  FVC-Pre L 1.71  P  FVC-Predicted Pre % 66  P  FVC-Post L 1.68  P  FVC-Predicted Post % 65  P  Pre FEV1/FVC % % 77  P  Post FEV1/FCV % % 83  P  FEV1-Pre L 1.32  P  FEV1-Predicted Pre % 68  P  FEV1-Post L 1.39  P  DLCO uncorrected ml/min/mmHg 13.32  P  DLCO UNC% %  76  P  DLCO corrected ml/min/mmHg 13.32  P  DLCO COR %Predicted % 76  P  DLVA Predicted % 107  P  TLC L 3.33  P  TLC % Predicted % 72  P  RV % Predicted % 76  P    P Preliminary result    WALK:      No data to display          Imaging: No results found.  Lab Results:  CBC    Component Value Date/Time   WBC 6.9 03/19/2022 1502   WBC 11.5 (H) 01/25/2022 2112   RBC 4.50 03/19/2022 1502   RBC 4.50 01/25/2022 2112   HGB 14.2 03/19/2022 1502   HCT 42.3 03/19/2022 1502   PLT 348 03/19/2022 1502   MCV 94 03/19/2022 1502   MCH 31.6 03/19/2022 1502   MCH 31.6 01/25/2022 2112   MCHC 33.6 03/19/2022 1502   MCHC 32.8 01/25/2022 2112   RDW 12.5 03/19/2022 1502   LYMPHSABS 2.5 03/19/2022 1502   MONOABS 1.1 (H) 01/25/2022 2112   EOSABS 0.1 03/19/2022 1502   BASOSABS 0.0 03/19/2022 1502     BMET    Component Value Date/Time   NA 143 03/19/2022 1502   K 4.5 03/19/2022 1502   CL 103 03/19/2022 1502   CO2 24 03/19/2022 1502   GLUCOSE 70 03/19/2022 1502   GLUCOSE 95 01/25/2022 2112   BUN 22 03/19/2022 1502   CREATININE 0.93 03/19/2022 1502   CALCIUM 9.5 03/19/2022 1502   GFRNONAA 56 (L) 01/25/2022 2112   GFRAA 71 11/09/2019 0823    BNP No results found for: "BNP"  ProBNP No results found for: "PROBNP"  Specialty Problems       Pulmonary Problems   Mild persistent asthma without complication    Onset of recurrent bronchitis in 1998 req short term pred/abx - started on med dose advair by [place] spring 2020  - 03/06/2020  After extensive coaching inhaler device,  effectiveness =    80% > continue Advair 115 2bid        Allergies  Allergen Reactions   Ivp Dye [Iodinated Contrast Media] Other (See Comments)    Respiratory distress   Covid-19 (Mrna) Vaccine     Flashing lights in visit, headaches, arm numbness, inability to move Flashing lights in visit, headaches, arm numbness, inability to move Flashing lights in visit, headaches, arm numbness, inability to move  Flashing lights in visit, headaches, arm numbness, inability to move Flashing lights in visit, headaches, arm numbness, inability to move   Nystatin Other (See Comments)   Codeine Nausea Only   Niaspan [Niacin Er] Rash and Other (See Comments)    Nightmares    Immunization History  Administered Date(s) Administered   Fluad Quad(high Dose 65+) 11/08/2019, 12/25/2020, 02/03/2022   Hepatitis A 11/09/2007   Hepatitis A, Adult 11/09/2007, 01/17/2021   IPV 11/09/2007   Influenza, High Dose Seasonal PF 11/08/2019   Influenza,inj,quad, With Preservative 11/14/2018   Influenza-Unspecified 11/08/2019   PFIZER(Purple Top)SARS-COV-2 Vaccination 09/23/2019   Pneumococcal Conjugate-13 11/18/2016   Pneumococcal Polysaccharide-23 10/25/2018   Td 01/13/2005   Tdap 10/24/2008, 12/25/2020   Typhoid  Live 11/09/2007    Past Medical History:  Diagnosis Date   Allergy    seasonal   Arthritis    Asthma    Duodenal ulcer    Gallstones    GERD (gastroesophageal reflux disease)    HLD (hyperlipidemia)    IBS (irritable bowel syndrome)    Kidney stones  Lumbosacral disc disease    L4-5, L5-S1   Osteopenia     Tobacco History: Social History   Tobacco Use  Smoking Status Former   Current packs/day: 0.00   Average packs/day: 0.5 packs/day for 1 year (0.5 ttl pk-yrs)   Types: Cigarettes   Start date: 01/14/1968   Quit date: 01/13/1969   Years since quitting: 53.5  Smokeless Tobacco Never   Counseling given: Not Answered   Continue to not smoke  Outpatient Encounter Medications as of 07/31/2022  Medication Sig   acetaminophen (TYLENOL) 325 MG tablet Take 325 mg by mouth as needed.   albuterol (VENTOLIN HFA) 108 (90 Base) MCG/ACT inhaler INHALE 1-2 PUFFS BY MOUTH EVERY 6 HOURS AS NEEDED FOR WHEEZE OR SHORTNESS OF BREATH   aspirin EC 81 MG tablet Take 81 mg by mouth daily. Swallow whole.   B Complex Vitamins (VITAMIN-B COMPLEX PO) Take 1 tablet by mouth daily.   Biotin 19147 MCG TABS Take by mouth daily.   carvedilol (COREG) 3.125 MG tablet Take 1 tablet (3.125 mg total) by mouth 2 (two) times daily with a meal.   chlorhexidine (PERIDEX) 0.12 % solution SMARTSIG:15 By Mouth Morning-Evening   DHEA 50 MG TABS Take 1 tablet by mouth every other day.   fluticasone-salmeterol (ADVAIR HFA) 115-21 MCG/ACT inhaler Inhale 2 puffs into the lungs 2 (two) times daily. As needed   folic acid (FOLVITE) 800 MCG tablet Take 400 mcg by mouth once a week.    ibuprofen (ADVIL) 600 MG tablet Take 1 tablet (600 mg total) by mouth every 8 (eight) hours as needed for moderate pain.   levOCARNitine-E-Coenzyme Q10 (CO Q-10 PLUS L-CARNITINE PO) Take 1 tablet by mouth daily.   Multiple Vitamin (MULTIVITAMIN) tablet Take 2 tablets by mouth daily.   omeprazole (PRILOSEC OTC) 20 MG tablet Take 20 mg by  mouth daily.   [DISCONTINUED] esomeprazole (NEXIUM) 40 MG capsule Take 1 capsule (40 mg total) by mouth 2 (two) times daily before a meal.   No facility-administered encounter medications on file as of 07/31/2022.     Review of Systems  Review of Systems  N/a Physical Exam  BP 114/70   Pulse (!) 59   Ht 5\' 1"  (1.549 m)   Wt 128 lb 3.2 oz (58.2 kg)   SpO2 98% Comment: on RA  BMI 24.22 kg/m   Wt Readings from Last 5 Encounters:  07/31/22 128 lb 3.2 oz (58.2 kg)  06/26/22 128 lb (58.1 kg)  06/16/22 128 lb (58.1 kg)  06/03/22 128 lb (58.1 kg)  05/22/22 129 lb 14.4 oz (58.9 kg)    BMI Readings from Last 5 Encounters:  07/31/22 24.22 kg/m  06/26/22 24.19 kg/m  06/16/22 24.19 kg/m  06/03/22 24.19 kg/m  05/22/22 24.54 kg/m     Physical Exam General: Sitting in chair, no acute distress Eyes: EOMI, no icterus Neck: Supple, no JVP Pulmonary: Clear, no work of breathing Cardiovascular warm no edema Abdomen: Nondistended, bowel sounds present MSK: No synovitis, no joint effusions Neuro: Normal gait, no weakness Psych: Normal mood, full affect   Assessment & Plan:   Asthma: Clinical diagnosis based on atopic symptoms, improvement in symptoms with bronchodilators/ICS/LABA, and variable nature of symptoms, sign of hyperinflation on chest x-ray.  Continue Advair HFA mid dose as well as albuterol as needed.Marland Kitchen  Dyspnea on exertion: Suspect related to asthma as above.  Improved with seasonal/environmental factors.    Mild restrictive lung disease: Demonstrated on total lung capacity PFT 07/2022.  DLCO is  just above lower limit of normal.  ILD considered.  Recent possible imaging of the lungs in the bases 12/2020 clear.  However her symptoms overall have improved with asthma treatment and seasonal changes.  Will hold off on cross-sectional imaging for now.  If symptoms were to worsen would pursue CT high-resolution to evaluate for possible ILD.   Return in about 6 months  (around 01/31/2023) for f/u Dr. Judeth Horn.   Karren Burly, MD 07/31/2022

## 2022-07-31 NOTE — Patient Instructions (Signed)
Full PFT performed today. °

## 2022-08-01 ENCOUNTER — Telehealth: Payer: Self-pay | Admitting: Internal Medicine

## 2022-08-01 ENCOUNTER — Encounter: Payer: Self-pay | Admitting: *Deleted

## 2022-08-01 MED ORDER — METOPROLOL TARTRATE 25 MG PO TABS: 25.0000 mg | ORAL_TABLET | Freq: Two times a day (BID) | ORAL | 6 refills | Status: DC

## 2022-08-01 MED ORDER — AMLODIPINE BESYLATE 5 MG PO TABS: 5.0000 mg | ORAL_TABLET | Freq: Every evening | ORAL | 6 refills | Status: DC

## 2022-08-01 NOTE — Telephone Encounter (Signed)
Pt c/o BP issue: STAT if pt c/o blurred vision, one-sided weakness or slurred speech  1. What are your last 5 BP readings? 171/85, 160/96,144/84,134/78  2. Are you having any other symptoms (ex. Dizziness, headache, blurred vision, passed out)? Headache, chest pressure  3. What is your BP issue? Pt states the chest pain woke her up because it was hurting so bad. Please advise.

## 2022-08-01 NOTE — Telephone Encounter (Signed)
No c/o active chest pain.  States issue is with chest pressure & headache when her BP is elevated.  Woke up at 4 am w/ chest pressure - BP was 171/85  48.  Waited a bit & rechecked - 160/96  59.  Says her chest pressure & H/A was going down as BP was coming down.  States she did take ASA 81mg  & tried to lay back down.  Woke up at 5:15 am - 144/84  57.  Took her morning dose of the Coreg 3.125 around 7:15 am and rechecked BP after a bit - 134/78 & at 9:00 am - 106/62.  States her BP always spikes at night and she typically can feel when it is about to happen.  Has also tried taking her night time dose around 10-11 pm to see if that would carry her a little longer & does not seem to be helping.  States that he started the Coreg on 07/24/22.  Does mention that her hands seem to be puffier than on the Lopressor.

## 2022-08-01 NOTE — Telephone Encounter (Signed)
Attempted to notify patient & got voicemail - left detailed message to check her mychart for message as we are coming up on the weekend.

## 2022-09-04 ENCOUNTER — Telehealth: Payer: Self-pay | Admitting: Internal Medicine

## 2022-09-04 MED ORDER — METOPROLOL TARTRATE 25 MG PO TABS: 12.5000 mg | ORAL_TABLET | Freq: Two times a day (BID) | ORAL | Status: DC

## 2022-09-04 NOTE — Telephone Encounter (Signed)
Pt c/o medication issue:  1. Name of Medication: metoprolol tartrate (LOPRESSOR) 25 MG tablet   2. How are you currently taking this medication (dosage and times per day)? Take 1 tablet (25 mg total) by mouth 2 (two) times daily.   3. Are you having a reaction (difficulty breathing--STAT)? No  4. What is your medication issue? Patient is calling because she is having headaches off and on for 3 weeks. Patient stated the headaches will sometimes make her nauseous. Patient states the medication doesn't keep her BP down for the whole day. Please advise.

## 2022-09-04 NOTE — Telephone Encounter (Signed)
Patient states BP is continuing to stay elevated.  Been on Lopressor 25mg  twice a day x 3 weeks and has had headache off/on since starting.  Was on Coreg previously & could not tolerate this - caused nausea, muscle aches & felt terrible after 2 days of starting.  States she is seeing her chiropractor next week to adjust her neck & also dealing with some new glasses.  Not sure if elevated BP's are causing the headaches or if related to the multiple issues she has going on.  States her chest does feel heavy at times with SOB occasionally but just saw Pulmonology & diagnosed with asthma.  Stated she never had this many issues till after she had covid.    8/22 - laying - 154/85  54 - had H/A upon waking           Noon sitting - 110/69  60  8/21 - laying - 143/78  51           Noon - 142/78  64           Late evening - 173/94  65          Bedtime - 139/77  60  8/20 - 9:00 am - 118/74  63           3:15 pm - 106/64  63           10:30 pm - 138/81  62

## 2022-09-04 NOTE — Telephone Encounter (Signed)
Patient notified and verbalized understanding. 

## 2022-09-05 NOTE — Telephone Encounter (Signed)
Patient states since cutting lopressor down to 1/2 tablet twice a day recommended by SM she is starting to feel much better she states she will call Monday and let us know ho she is doing and if she wants to switch to bisoprolol recommended by VM

## 2022-09-08 ENCOUNTER — Encounter: Payer: Self-pay | Admitting: Internal Medicine

## 2022-09-11 ENCOUNTER — Ambulatory Visit (INDEPENDENT_AMBULATORY_CARE_PROVIDER_SITE_OTHER): Payer: Medicare Other | Admitting: Family Medicine

## 2022-09-11 ENCOUNTER — Encounter: Payer: Self-pay | Admitting: Family Medicine

## 2022-09-11 VITALS — BP 113/61 | HR 58 | Temp 98.1°F | Ht 61.0 in | Wt 130.2 lb

## 2022-09-11 DIAGNOSIS — L570 Actinic keratosis: Secondary | ICD-10-CM | POA: Diagnosis not present

## 2022-09-11 DIAGNOSIS — R519 Headache, unspecified: Secondary | ICD-10-CM

## 2022-09-11 NOTE — Progress Notes (Signed)
   Acute Office Visit  Subjective:     Patient ID: Denise Benton, female    DOB: 03/23/1950, 72 y.o.   MRN: [id number]  Chief Complaint  Patient presents with   skin lesion    HPI Patient is in today for a skin lesion. She has an actinic keratosis of her forehead. She has seen dermatology for this a few months ago. Since the visit with dermatology, the lesion has become more raised and scab. It becomes inflamed at time. She would like to go back to dermatology to discuss removal.   She was having frequent headaches. She had gotten new glasses prior to headaches starting. She decided to switch back to her old glasses, and her headaches have since resolved.   ROS As per HPI.      Objective:    BP 113/61   Pulse (!) 58   Temp 98.1 F (36.7 C) (Temporal)   Ht 5\' 1"  (1.549 m)   Wt 130 lb 4 oz (59.1 kg)   SpO2 98%   BMI 24.61 kg/m    Physical Exam Vitals and nursing note reviewed.  Constitutional:      General: She is not in acute distress.    Appearance: She is not ill-appearing, toxic-appearing or diaphoretic.  Eyes:     Extraocular Movements: Extraocular movements intact.  Cardiovascular:     Rate and Rhythm: Normal rate and regular rhythm.     Heart sounds: Normal heart sounds. No murmur heard. Pulmonary:     Effort: Pulmonary effort is normal.     Breath sounds: Normal breath sounds.  Skin:    General: Skin is warm and dry.     Findings: Lesion (0.5 cm actinic keratosis to forehead) present.  Neurological:     General: No focal deficit present.     Mental Status: She is alert and oriented to person, place, and time.     Motor: No weakness.     Gait: Gait normal.  Psychiatric:        Mood and Affect: Mood normal.        Behavior: Behavior normal.     No results found for any visits on 09/11/22.      Assessment & Plan:   Denise "Mariane Duval" was seen today for skin lesion.  Diagnoses and all orders for this visit:  Actinic keratosis of  forehead Schedule follow up with dermatology to discuss removal.   Nonintractable episodic headache, unspecified headache type Now resolved.   Return to office for new or worsening symptoms, or if symptoms persist.   The patient indicates understanding of these issues and agrees with the plan.  Gabriel Earing, FNP

## 2022-09-12 ENCOUNTER — Encounter: Payer: Self-pay | Admitting: Family Medicine

## 2022-09-16 ENCOUNTER — Encounter: Payer: Self-pay | Admitting: Physician Assistant

## 2022-09-16 ENCOUNTER — Ambulatory Visit (INDEPENDENT_AMBULATORY_CARE_PROVIDER_SITE_OTHER): Payer: Medicare Other | Admitting: Physician Assistant

## 2022-09-16 VITALS — BP 92/60 | HR 57 | Ht 61.0 in | Wt 127.0 lb

## 2022-09-16 DIAGNOSIS — R1013 Epigastric pain: Secondary | ICD-10-CM

## 2022-09-16 DIAGNOSIS — K219 Gastro-esophageal reflux disease without esophagitis: Secondary | ICD-10-CM

## 2022-09-16 NOTE — Progress Notes (Signed)
Chief Complaint: Follow-up GERD  HPI:    Denise Benton is a 72 year old female with a past medical history as listed below including GERD and IBS, known to Denise Benton, who returns to clinic today for follow-up of her GERD.     2012 outside colonoscopy-repeat recommended in 10 years.    Fall of 2023 Cologuard negative per patient.    03/2019 EGD with gastritis and multiple gastric polyps and otherwise normal.  Biopsy showed reactive gastropathy and fundic gland polyps.    05/17/2019 patient seen in clinic by Denise Benton for reflux.  At that time discussed an element of LPR and referred to ENT.  She was continued on Nexium 40 mg daily and then to decrease dose to 20 mg daily.  She was worried about side effects from medicine.  Discussed surgical options for reflux which she did not want to pursue those at the time.    06/26/2022 patient presented to clinic, retired Engineer, civil (consulting), having issues with reflux ever since she tried to come off her Nexium completely.  That time discussed previous EGD with gastritis and worsening symptoms also use of inhalers and gaining 10 to 15 pounds, restarted the patient on Nexium 40 every morning.  Answered questions about side effects from PPIs.    Today, patient presents to clinic and tells me she has had minimal improvement on Nexium 40 mg daily and even tried increasing this to twice a day for a while but just does not feel like anything helps with her discomfort.  She feels the pain right up underneath her rib cage, she has thought it may be many things. Started to workout a couple times a week with the physical therapist and they do a lot of chest workouts, but tells me this pain is worse after she eats and she gets severely bloated and then sometimes it increases and is so intense she will get dizzy from the discomfort.  She truly feels like it is probably her GI system.    Denies fever, chills or weight loss.  Past Medical History:  Diagnosis Date   Allergy     seasonal   Arthritis    Asthma    Duodenal ulcer    Gallstones    GERD (gastroesophageal reflux disease)    HLD (hyperlipidemia)    IBS (irritable bowel syndrome)    Kidney stones    Lumbosacral disc disease    L4-5, L5-S1   Osteopenia     Past Surgical History:  Procedure Laterality Date   BREAST CYST ASPIRATION Right    pt has cysts aspiration done for lumps   CARPOMETACARPAL (CMC) FUSION OF THUMB  02/2022   CESAREAN SECTION  1980   x2; again in 1984   LAPAROSCOPIC CHOLECYSTECTOMY  2000   SKIN LESION EXCISION  07/2011   from scalp - keratosis    Current Outpatient Medications  Medication Sig Dispense Refill   acetaminophen (TYLENOL) 325 MG tablet Take 325 mg by mouth as needed.     albuterol (VENTOLIN HFA) 108 (90 Base) MCG/ACT inhaler INHALE 1-2 PUFFS BY MOUTH EVERY 6 HOURS AS NEEDED FOR WHEEZE OR SHORTNESS OF BREATH 8.5 each 0   aspirin EC 81 MG tablet Take 81 mg by mouth daily. Swallow whole.     B Complex Vitamins (VITAMIN-B COMPLEX PO) Take 1 tablet by mouth daily.     Biotin 81191 MCG TABS Take by mouth daily.     chlorhexidine (PERIDEX) 0.12 % solution SMARTSIG:15 By Mouth Morning-Evening  fluticasone-salmeterol (ADVAIR HFA) 115-21 MCG/ACT inhaler Inhale 2 puffs into the lungs 2 (two) times daily. As needed     folic acid (FOLVITE) 800 MCG tablet Take 400 mcg by mouth once a week.      ibuprofen (ADVIL) 600 MG tablet Take 1 tablet (600 mg total) by mouth every 8 (eight) hours as needed for moderate pain. 30 tablet 1   levOCARNitine-E-Coenzyme Q10 (CO Q-10 PLUS L-CARNITINE PO) Take 1 tablet by mouth daily.     metoprolol tartrate (LOPRESSOR) 25 MG tablet Take 0.5 tablets (12.5 mg total) by mouth 2 (two) times daily.     Multiple Vitamin (MULTIVITAMIN) tablet Take 2 tablets by mouth daily.     No current facility-administered medications for this visit.    Allergies as of 09/16/2022 - Review Complete 09/12/2022  Allergen Reaction Noted   Ivp dye [iodinated  contrast media] Other (See Comments) 12/25/2011   Covid-19 (mrna) vaccine  12/31/2020   Nystatin Other (See Comments) 06/07/2022   Codeine Nausea Only 12/25/2011   Niaspan [niacin er] Rash and Other (See Comments) 12/25/2011    Family History  Problem Relation Age of Onset   Tremor Mother    Hyperlipidemia Mother    Skin cancer Mother    Dementia Mother    Congestive Heart Failure Father    Diabetes Father    Heart disease Father        congestive heart failure   Multiple sclerosis Sister    Heart disease Sister    Kidney Stones Sister    Tremor Sister    Diabetes Paternal Grandfather    Hypertension Daughter    Anxiety disorder Daughter    Colon cancer Neg Hx    Esophageal cancer Neg Hx    Stomach cancer Neg Hx    Rectal cancer Neg Hx    Inflammatory bowel disease Neg Hx    Liver disease Neg Hx    Pancreatic cancer Neg Hx    Sleep apnea Neg Hx     Social History   Socioeconomic History   Marital status: Married    Spouse name: Denise Benton    Number of children: 2   Years of education: 4 years college   Highest education level: Bachelor's degree (e.g., BA, AB, BS)  Occupational History   Occupation: Retired RN/NP   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  Tobacco Use   Smoking status: Former    Current packs/day: 0.00    Average packs/day: 0.5 packs/day for 1 year (0.5 ttl pk-yrs)    Types: Cigarettes    Start date: 01/14/1968    Quit date: 01/13/1969    Years since quitting: 53.7   Smokeless tobacco: Never  Vaping Use   Vaping status: Never Used  Substance and Sexual Activity   Alcohol use: No   Drug use: No   Sexual activity: Not Currently  Other Topics Concern   Not on file  Social History Narrative   Lives home with husband - daughter in [place] here from [place] in 2020   Social Determinants of Health   Financial Resource Strain: Low Risk  (09/09/2022)   Overall Financial Resource Strain (CARDIA)     Difficulty of Paying Living Expenses: Not hard at all  Food Insecurity: No Food Insecurity (09/09/2022)   Hunger Vital Sign    Worried About Running Out of Food in the Last Year: Never true    Ran Out of Food in the Last Year: Never  true  Transportation Needs: No Transportation Needs (09/09/2022)   PRAPARE - Administrator, Civil Service (Medical): No    Lack of Transportation (Non-Medical): No  Physical Activity: Insufficiently Active (09/09/2022)   Exercise Vital Sign    Days of Exercise per Week: 3 days    Minutes of Exercise per Session: 30 min  Stress: No Stress Concern Present (09/09/2022)   [employer] of Occupational Health - Occupational Stress Questionnaire    Feeling of Stress : Only a little  Social Connections: Moderately Integrated (09/09/2022)   Social Connection and Isolation Panel [NHANES]    Frequency of Communication with Friends and Family: Once a week    Frequency of Social Gatherings with Friends and Family: Once a week    Attends Religious Services: More than 4 times per year    Active Member of [employer] or Organizations: Yes    Attends Engineer, structural: More than 4 times per year    Marital Status: Married  Catering manager Violence: Not At Risk (06/16/2022)   Humiliation, Afraid, Rape, and Kick questionnaire    Fear of Current or Ex-Partner: No    Emotionally Abused: No    Physically Abused: No    Sexually Abused: No    Review of Systems:    Constitutional: No weight loss, fever or chills Cardiovascular: No chest pain  Respiratory: No SOB Gastrointestinal: See HPI and otherwise negative   Physical Exam:  Vital signs: BP 92/60   Pulse (!) 57   Ht 5\' 1"  (1.549 m)   Wt 127 lb (57.6 kg)   BMI 24.00 kg/m    Constitutional:   Pleasant Caucasian female appears to be in NAD, Well developed, Well nourished, alert and cooperative Respiratory: Respirations even and unlabored. Lungs clear to auscultation bilaterally.   No wheezes,  crackles, or rhonchi.  Cardiovascular: Normal S1, S2. No MRG. Regular rate and rhythm. No peripheral edema, cyanosis or pallor.  Gastrointestinal:  Soft, nondistended, moderate epigastric TTP, some tenderness to even light palpation. No rebound or guarding. Normal bowel sounds. No appreciable masses or hepatomegaly. Rectal:  Not performed.  Psychiatric: Demonstrates good judgement and reason without abnormal affect or behaviors.  RELEVANT LABS AND IMAGING: CBC    Component Value Date/Time   WBC 6.9 03/19/2022 1502   WBC 11.5 (H) 01/25/2022 2112   RBC 4.50 03/19/2022 1502   RBC 4.50 01/25/2022 2112   HGB 14.2 03/19/2022 1502   HCT 42.3 03/19/2022 1502   PLT 348 03/19/2022 1502   MCV 94 03/19/2022 1502   MCH 31.6 03/19/2022 1502   MCH 31.6 01/25/2022 2112   MCHC 33.6 03/19/2022 1502   MCHC 32.8 01/25/2022 2112   RDW 12.5 03/19/2022 1502   LYMPHSABS 2.5 03/19/2022 1502   MONOABS 1.1 (H) 01/25/2022 2112   EOSABS 0.1 03/19/2022 1502   BASOSABS 0.0 03/19/2022 1502    CMP     Component Value Date/Time   NA 143 03/19/2022 1502   K 4.5 03/19/2022 1502   CL 103 03/19/2022 1502   CO2 24 03/19/2022 1502   GLUCOSE 70 03/19/2022 1502   GLUCOSE 95 01/25/2022 2112   BUN 22 03/19/2022 1502   CREATININE 0.93 03/19/2022 1502   CALCIUM 9.5 03/19/2022 1502   PROT 6.9 03/19/2022 1502   ALBUMIN 4.5 03/19/2022 1502   AST 25 03/19/2022 1502   ALT 18 03/19/2022 1502   ALKPHOS 88 03/19/2022 1502   BILITOT 0.5 03/19/2022 1502   GFRNONAA 56 (L) 01/25/2022 2112  GFRAA 71 11/09/2019 0823    Assessment: 1.  Epigastric pain: Unchanged after addition of Nexium 40 mg daily and even increase to twice daily by the patient, tender to only light palpation, wonder if this is abdominal wall pain/musculoskeletal in nature versus true gastritis 2.  GERD  Plan: 1.  Will update EGD.  Scheduled patient with Denise Benton in the [facility].  Did provide the patient a detailed list of risks for this procedure and  she agrees to proceed. 2.  For now continue Nexium 40 mg daily 3.  Patient to follow in clinic per recommendations from Denise Benton after time of procedure.  May need to consider esophageal manometry/other evaluation pending results from EGD.  Denise Meeker, PA-C [facility] 09/16/2022, 9:58 AM  Cc: Gabriel Earing, FNP

## 2022-09-16 NOTE — Patient Instructions (Signed)
You have been scheduled for an endoscopy. Please follow written instructions given to you at your visit today.  If you use inhalers (even only as needed), please bring them with you on the day of your procedure.  If you take any of the following medications, they will need to be adjusted prior to your procedure:   DO NOT TAKE 7 DAYS PRIOR TO TEST- Trulicity (dulaglutide) Ozempic, Wegovy (semaglutide) Mounjaro (tirzepatide) Bydureon Bcise (exanatide extended release)  DO NOT TAKE 1 DAY PRIOR TO YOUR TEST Rybelsus (semaglutide) Adlyxin (lixisenatide) Victoza (liraglutide) Byetta (exanatide) _______________________________________________________  If your blood pressure at your visit was 140/90 or greater, please contact your primary care physician to follow up on this.  _______________________________________________________  If you are age 48 or older, your body mass index should be between 23-30. Your Body mass index is 24 kg/m. If this is out of the aforementioned range listed, please consider follow up with your Primary Care Provider.  If you are age 31 or younger, your body mass index should be between 19-25. Your Body mass index is 24 kg/m. If this is out of the aformentioned range listed, please consider follow up with your Primary Care Provider.   ________________________________________________________  The [facility] providers would like to encourage you to use [facility] to communicate with providers for non-urgent requests or questions.  Due to long hold times on the telephone, sending your provider a message by [facility] may be a faster and more efficient way to get a response.  Please allow 48 business hours for a response.  Please remember that this is for non-urgent requests.  _______________________________________________________

## 2022-09-16 NOTE — Progress Notes (Signed)
Attending Physician's Attestation   I have reviewed the chart.   I agree with the Advanced Practitioner's note, impression, and recommendations with any updates as below. Agree with endoscopic evaluation as I had previously outlined is being important and next step for her evaluation.   Corliss Parish, MD [facility] Advanced Endoscopy Office # [phone]

## 2022-09-19 ENCOUNTER — Encounter: Payer: Self-pay | Admitting: Gastroenterology

## 2022-09-19 ENCOUNTER — Ambulatory Visit (AMBULATORY_SURGERY_CENTER): Payer: Medicare Other | Admitting: Gastroenterology

## 2022-09-19 VITALS — BP 107/56 | HR 49 | Temp 97.3°F | Resp 11 | Ht 61.0 in | Wt 127.0 lb

## 2022-09-19 DIAGNOSIS — K2970 Gastritis, unspecified, without bleeding: Secondary | ICD-10-CM

## 2022-09-19 DIAGNOSIS — K259 Gastric ulcer, unspecified as acute or chronic, without hemorrhage or perforation: Secondary | ICD-10-CM | POA: Diagnosis not present

## 2022-09-19 DIAGNOSIS — R1013 Epigastric pain: Secondary | ICD-10-CM

## 2022-09-19 DIAGNOSIS — K219 Gastro-esophageal reflux disease without esophagitis: Secondary | ICD-10-CM

## 2022-09-19 MED ORDER — SODIUM CHLORIDE 0.9 % IV SOLN: 500.0000 mL | Freq: Once | INTRAVENOUS | Status: DC

## 2022-09-19 MED ORDER — RABEPRAZOLE SODIUM 20 MG PO TBEC
20.0000 mg | DELAYED_RELEASE_TABLET | Freq: Every day | ORAL | 2 refills | Status: DC
Start: 2022-09-19 — End: 2023-03-24

## 2022-09-19 MED ORDER — SUCRALFATE 1 G PO TABS
1.0000 g | ORAL_TABLET | Freq: Two times a day (BID) | ORAL | 2 refills | Status: DC
Start: 2022-09-19 — End: 2022-12-31

## 2022-09-19 NOTE — Op Note (Signed)
[facility] Patient Name: Denise Benton Procedure Date: 09/19/2022 10:28 AM MRN: [id number] Endoscopist: Corliss Parish , MD, [id number] Age: 72 Referring MD:  Date of Birth: [birth year] Gender: Female Account #: [id number] Procedure:                Upper GI endoscopy Indications:              Epigastric abdominal pain, Heartburn Medicines:                Monitored Anesthesia Care Procedure:                Pre-Anesthesia Assessment:                           - Prior to the procedure, a History and Physical                            was performed, and patient medications and                            allergies were reviewed. The patient's tolerance of                            previous anesthesia was also reviewed. The risks                            and benefits of the procedure and the sedation                            options and risks were discussed with the patient.                            All questions were answered, and informed consent                            was obtained. Prior Anticoagulants: The patient has                            taken no anticoagulant or antiplatelet agents                            except for aspirin. ASA Grade Assessment: II - A                            patient with mild systemic disease. After reviewing                            the risks and benefits, the patient was deemed in                            satisfactory condition to undergo the procedure.                           After obtaining informed consent, the endoscope was  passed under direct vision. Throughout the                            procedure, the patient's blood pressure, pulse, and                            oxygen saturations were monitored continuously. The                            GIF [id number] #[id number] was introduced through the                            mouth, and advanced to the second part of duodenum.                             The upper GI endoscopy was accomplished without                            difficulty. The patient tolerated the procedure. Scope In: Scope Out: Findings:                 No gross lesions were noted in the entire                            esophagus. Biopsies were taken with a cold forceps                            for histology.                           The Z-line was regular and was found 36 cm from the                            incisors.                           A 1 cm hiatal hernia was present.                           Localized severe inflammation characterized by                            erosions, erythema, friability and multiple linear                            and cratered ulcerations was found in the gastric                            antrum, in the prepyloric region of the stomach and                            at the pylorus.                           No other gross lesions were noted in  the entire                            examined stomach. Biopsies were taken with a cold                            forceps for histology and Helicobacter pylori                            testing.                           No gross lesions were noted in the duodenal bulb,                            in the first portion of the duodenum and in the                            second portion of the duodenum. Complications:            No immediate complications. Estimated Blood Loss:     Estimated blood loss was minimal. Impression:               - No gross lesions in the entire esophagus.                            Biopsied.                           - Z-line regular, 36 cm from the incisors.                           - 1 cm hiatal hernia.                           - Gastritis and gastric ulcers in the antrum. No                            other gross lesions in the entire stomach. Biopsied.                           - No gross lesions in the duodenal bulb, in the                             first portion of the duodenum and in the second                            portion of the duodenum. Recommendation:           - The patient will be observed post-procedure,                            until all discharge criteria are met.                           - Discharge patient to  home.                           - Patient has a contact number available for                            emergencies. The signs and symptoms of potential                            delayed complications were discussed with the                            patient. Return to normal activities tomorrow.                            Written discharge instructions were provided to the                            patient.                           - Resume previous diet.                           - Start Aciphex 20 mg twice daily.                           - Start Carafate 1 g twice daily (tablet form).                           - Observe patient's clinical course.                           - Repeat upper endoscopy in 3 months to check                            healing.                           - Although she has a small hiatal hernia, it is                            unlikely that this is causing her significant                            issues of her subxiphoid pain. However with the                            significant ulcer disease that she has this needs                            to be healed up to see if this could be causing her                            symptoms.                           -  We can pursue a pH impedance test and esophageal                            manometry for this individual as well on PPI                            therapy to rule out need for fundoplication, as it                            looks currently like she does not have severe                            esophagitis changes.                           - The findings and recommendations were discussed                             with the patient.                           - The findings and recommendations were discussed                            with the patient's family. Corliss Parish, MD 09/19/2022 11:01:14 AM

## 2022-09-19 NOTE — Progress Notes (Signed)
Called to room to assist during endoscopic procedure.  Patient ID and intended procedure confirmed with present staff. Received instructions for my participation in the procedure from the performing physician.  

## 2022-09-19 NOTE — Patient Instructions (Signed)
Start Carafate  Start Aciphex  See f/u appointments  YOU HAD AN ENDOSCOPIC PROCEDURE TODAY: Refer to the procedure report and other information in the discharge instructions given to you for any specific questions about what was found during the examination. If this information does not answer your questions, please call [place] office at [phone] to clarify.   YOU SHOULD EXPECT: Some feelings of bloating in the abdomen. Passage of more gas than usual. Walking can help get rid of the air that was put into your GI tract during the procedure and reduce the bloating. If you had a lower endoscopy (such as a colonoscopy or flexible sigmoidoscopy) you may notice spotting of blood in your stool or on the toilet paper. Some abdominal soreness may be present for a day or two, also.  DIET: Your first meal following the procedure should be a light meal and then it is ok to progress to your normal diet. A half-sandwich or bowl of soup is an example of a good first meal. Heavy or fried foods are harder to digest and may make you feel nauseous or bloated. Drink plenty of fluids but you should avoid alcoholic beverages for 24 hours. If you had a esophageal dilation, please see attached instructions for diet.    ACTIVITY: Your care partner should take you home directly after the procedure. You should plan to take it easy, moving slowly for the rest of the day. You can resume normal activity the day after the procedure however YOU SHOULD NOT DRIVE, use power tools, machinery or perform tasks that involve climbing or major physical exertion for 24 hours (because of the sedation medicines used during the test).   SYMPTOMS TO REPORT IMMEDIATELY: A gastroenterologist can be reached at any hour. Please call [phone]  for any of the following symptoms:  Following upper endoscopy (EGD, EUS, ERCP, esophageal dilation) Vomiting of blood or coffee ground material  New, significant abdominal pain  New, significant  chest pain or pain under the shoulder blades  Painful or persistently difficult swallowing  New shortness of breath  Black, tarry-looking or red, bloody stools  FOLLOW UP:  If any biopsies were taken you will be contacted by phone or by letter within the next 1-3 weeks. Call [phone]  if you have not heard about the biopsies in 3 weeks.  Please also call with any specific questions about appointments or follow up tests.

## 2022-09-19 NOTE — Progress Notes (Unsigned)
I have reviewed the patient's medical history in detail and updated the computerized patient record.

## 2022-09-19 NOTE — Progress Notes (Unsigned)
GASTROENTEROLOGY PROCEDURE H&P NOTE   Primary Care Physician: Yvonne Petite Earing, FNP  HPI: Denise Benton is a 72 y.o. female who presents for EGD for evaluation of GERD symptoms and bloating and abdominal pain.  Past Medical History:  Diagnosis Date   Allergy    seasonal   Arthritis    Asthma    Duodenal ulcer    Gallstones    GERD (gastroesophageal reflux disease)    HLD (hyperlipidemia)    IBS (irritable bowel syndrome)    Kidney stones    Lumbosacral disc disease    L4-5, L5-S1   Osteopenia    Past Surgical History:  Procedure Laterality Date   BREAST CYST ASPIRATION Right    pt has cysts aspiration done for lumps   CARPOMETACARPAL (CMC) FUSION OF THUMB  02/2022   CESAREAN SECTION  1980   x2; again in 1984   LAPAROSCOPIC CHOLECYSTECTOMY  2000   SKIN LESION EXCISION  07/2011   from scalp - keratosis   Current Outpatient Medications  Medication Sig Dispense Refill   acetaminophen (TYLENOL) 325 MG tablet Take 325 mg by mouth as needed.     albuterol (VENTOLIN HFA) 108 (90 Base) MCG/ACT inhaler INHALE 1-2 PUFFS BY MOUTH EVERY 6 HOURS AS NEEDED FOR WHEEZE OR SHORTNESS OF BREATH 8.5 each 0   aspirin EC 81 MG tablet Take 81 mg by mouth daily. Swallow whole.     B Complex Vitamins (VITAMIN-B COMPLEX PO) Take 1 tablet by mouth daily.     Biotin 82956 MCG TABS Take by mouth daily.     chlorhexidine (PERIDEX) 0.12 % solution SMARTSIG:15 By Mouth Morning-Evening     fluticasone-salmeterol (ADVAIR HFA) 115-21 MCG/ACT inhaler Inhale 2 puffs into the lungs 2 (two) times daily. As needed     folic acid (FOLVITE) 800 MCG tablet Take 400 mcg by mouth once a week.      levOCARNitine-E-Coenzyme Q10 (CO Q-10 PLUS L-CARNITINE PO) Take 1 tablet by mouth daily.     metoprolol tartrate (LOPRESSOR) 25 MG tablet Take 0.5 tablets (12.5 mg total) by mouth 2 (two) times daily.     Multiple Vitamin (MULTIVITAMIN) tablet Take 2 tablets by mouth daily.     ibuprofen (ADVIL) 600 MG tablet  Take 1 tablet (600 mg total) by mouth every 8 (eight) hours as needed for moderate pain. 30 tablet 1   Current Facility-Administered Medications  Medication Dose Route Frequency Provider Last Rate Last Admin   0.9 %  sodium chloride infusion  500 mL Intravenous Once Mansouraty, Netty Starring., MD        Current Outpatient Medications:    acetaminophen (TYLENOL) 325 MG tablet, Take 325 mg by mouth as needed., Disp: , Rfl:    albuterol (VENTOLIN HFA) 108 (90 Base) MCG/ACT inhaler, INHALE 1-2 PUFFS BY MOUTH EVERY 6 HOURS AS NEEDED FOR WHEEZE OR SHORTNESS OF BREATH, Disp: 8.5 each, Rfl: 0   aspirin EC 81 MG tablet, Take 81 mg by mouth daily. Swallow whole., Disp: , Rfl:    B Complex Vitamins (VITAMIN-B COMPLEX PO), Take 1 tablet by mouth daily., Disp: , Rfl:    Biotin 21308 MCG TABS, Take by mouth daily., Disp: , Rfl:    chlorhexidine (PERIDEX) 0.12 % solution, SMARTSIG:15 By Mouth Morning-Evening, Disp: , Rfl:    fluticasone-salmeterol (ADVAIR HFA) 115-21 MCG/ACT inhaler, Inhale 2 puffs into the lungs 2 (two) times daily. As needed, Disp: , Rfl:    folic acid (FOLVITE) 800 MCG tablet, Take 400 mcg  by mouth once a week. , Disp: , Rfl:    levOCARNitine-E-Coenzyme Q10 (CO Q-10 PLUS L-CARNITINE PO), Take 1 tablet by mouth daily., Disp: , Rfl:    metoprolol tartrate (LOPRESSOR) 25 MG tablet, Take 0.5 tablets (12.5 mg total) by mouth 2 (two) times daily., Disp: , Rfl:    Multiple Vitamin (MULTIVITAMIN) tablet, Take 2 tablets by mouth daily., Disp: , Rfl:    ibuprofen (ADVIL) 600 MG tablet, Take 1 tablet (600 mg total) by mouth every 8 (eight) hours as needed for moderate pain., Disp: 30 tablet, Rfl: 1  Current Facility-Administered Medications:    0.9 %  sodium chloride infusion, 500 mL, Intravenous, Once, Mansouraty, Netty Starring., MD Allergies  Allergen Reactions   Ivp Dye [Iodinated Contrast Media] Other (See Comments)    Respiratory distress   Covid-19 (Mrna) Vaccine     Flashing lights in visit,  headaches, arm numbness, inability to move Flashing lights in visit, headaches, arm numbness, inability to move Flashing lights in visit, headaches, arm numbness, inability to move  Flashing lights in visit, headaches, arm numbness, inability to move Flashing lights in visit, headaches, arm numbness, inability to move   Nystatin Other (See Comments)   Codeine Nausea Only   Niaspan [Niacin Er] Rash and Other (See Comments)    Nightmares   Family History  Problem Relation Age of Onset   Tremor Mother    Hyperlipidemia Mother    Skin cancer Mother    Dementia Mother    Congestive Heart Failure Father    Diabetes Father    Heart disease Father        congestive heart failure   Multiple sclerosis Sister    Heart disease Sister    Kidney Stones Sister    Tremor Sister    Diabetes Paternal Grandfather    Hypertension Daughter    Anxiety disorder Daughter    Colon cancer Neg Hx    Esophageal cancer Neg Hx    Stomach cancer Neg Hx    Rectal cancer Neg Hx    Inflammatory bowel disease Neg Hx    Liver disease Neg Hx    Pancreatic cancer Neg Hx    Sleep apnea Neg Hx    Social History   Socioeconomic History   Marital status: Married    Spouse name: Fayrene Fearing    Number of children: 2   Years of education: 4 years college   Highest education level: Bachelor's degree (e.g., BA, AB, BS)  Occupational History   Occupation: Retired RN/NP   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  Tobacco Use   Smoking status: Former    Current packs/day: 0.00    Average packs/day: 0.5 packs/day for 1 year (0.5 ttl pk-yrs)    Types: Cigarettes    Start date: 01/14/1968    Quit date: 01/13/1969    Years since quitting: 53.7   Smokeless tobacco: Never  Vaping Use   Vaping status: Never Used  Substance and Sexual Activity   Alcohol use: No   Drug use: No   Sexual activity: Not Currently  Other Topics Concern   Not on file  Social History Narrative   Lives home with  husband - daughter in [place] here from [place] in 2020   Social Determinants of Health   Financial Resource Strain: Low Risk  (09/09/2022)   Overall Financial Resource Strain (CARDIA)    Difficulty of Paying Living Expenses: Not hard at all  Food Insecurity:  No Food Insecurity (09/09/2022)   Hunger Vital Sign    Worried About Running Out of Food in the Last Year: Never true    Ran Out of Food in the Last Year: Never true  Transportation Needs: No Transportation Needs (09/09/2022)   PRAPARE - Administrator, Civil Service (Medical): No    Lack of Transportation (Non-Medical): No  Physical Activity: Insufficiently Active (09/09/2022)   Exercise Vital Sign    Days of Exercise per Week: 3 days    Minutes of Exercise per Session: 30 min  Stress: No Stress Concern Present (09/09/2022)   [employer] of Occupational Health - Occupational Stress Questionnaire    Feeling of Stress : Only a little  Social Connections: Moderately Integrated (09/09/2022)   Social Connection and Isolation Panel [NHANES]    Frequency of Communication with Friends and Family: Once a week    Frequency of Social Gatherings with Friends and Family: Once a week    Attends Religious Services: More than 4 times per year    Active Member of [employer] or Organizations: Yes    Attends Banker Meetings: More than 4 times per year    Marital Status: Married  Catering manager Violence: Not At Risk (06/16/2022)   Humiliation, Afraid, Rape, and Kick questionnaire    Fear of Current or Ex-Partner: No    Emotionally Abused: No    Physically Abused: No    Sexually Abused: No    Physical Exam: Today's Vitals   09/19/22 1018 09/19/22 1019  BP: (!) 146/76   Pulse: (!) 49   Temp: (!) 97.3 F (36.3 C)   TempSrc: Temporal   SpO2: 99%   Weight: 127 lb (57.6 kg)   Height: 5\' 1"  (1.549 m)   PainSc:  3    Body mass index is 24 kg/m. GEN: NAD EYE: Sclerae anicteric ENT: MMM CV:  Non-tachycardic GI: Soft, NT/ND NEURO:  Alert & Oriented x 3  Lab Results: No results for input(s): "WBC", "HGB", "HCT", "PLT" in the last 72 hours. BMET No results for input(s): "NA", "K", "CL", "CO2", "GLUCOSE", "BUN", "CREATININE", "CALCIUM" in the last 72 hours. LFT No results for input(s): "PROT", "ALBUMIN", "AST", "ALT", "ALKPHOS", "BILITOT", "BILIDIR", "IBILI" in the last 72 hours. PT/INR No results for input(s): "LABPROT", "INR" in the last 72 hours.   Impression / Plan: This is a 72 y.o.female who presents for EGD for evaluation of GERD symptoms and bloating and abdominal pain.   The risks and benefits of endoscopic evaluation/treatment were discussed with the patient and/or family; these include but are not limited to the risk of perforation, infection, bleeding, missed lesions, lack of diagnosis, severe illness requiring hospitalization, as well as anesthesia and sedation related illnesses.  The patient's history has been reviewed, patient examined, no change in status, and deemed stable for procedure.  The patient and/or family is agreeable to proceed.    Corliss Parish, MD [facility] Advanced Endoscopy Office # [phone]

## 2022-09-19 NOTE — Progress Notes (Unsigned)
Report to PACU, RN, vss, BBS= Clear.  

## 2022-09-22 ENCOUNTER — Telehealth: Payer: Self-pay | Admitting: *Deleted

## 2022-09-22 NOTE — Telephone Encounter (Signed)
Attempted f/u phone call. No answer. Left message. °

## 2022-09-23 ENCOUNTER — Encounter: Payer: Self-pay | Admitting: Gastroenterology

## 2022-09-25 ENCOUNTER — Ambulatory Visit: Payer: Medicare Other | Admitting: Internal Medicine

## 2022-10-06 ENCOUNTER — Encounter: Payer: Self-pay | Admitting: Internal Medicine

## 2022-10-06 ENCOUNTER — Other Ambulatory Visit: Payer: Self-pay | Admitting: Nurse Practitioner

## 2022-10-06 ENCOUNTER — Ambulatory Visit: Payer: Medicare Other | Attending: Internal Medicine | Admitting: Internal Medicine

## 2022-10-06 VITALS — BP 124/64 | HR 64 | Ht 61.0 in | Wt 132.0 lb

## 2022-10-06 DIAGNOSIS — I10 Essential (primary) hypertension: Secondary | ICD-10-CM | POA: Diagnosis present

## 2022-10-06 MED ORDER — DILTIAZEM HCL 60 MG PO TABS: 60.0000 mg | ORAL_TABLET | Freq: Three times a day (TID) | ORAL | 3 refills | Status: DC | PRN

## 2022-10-06 MED ORDER — CHLORTHALIDONE 25 MG PO TABS
25.0000 mg | ORAL_TABLET | Freq: Every day | ORAL | 3 refills | Status: AC
Start: 2022-10-06 — End: 2023-01-04

## 2022-10-06 NOTE — Patient Instructions (Signed)
Medication Instructions:  Your physician has recommended you make the following change in your medication:   -Stop Metoprolol -Start Chlorthalidone 25 mg tablet once nightly at bedtime -Start Diltiazem 60 mg every 8 hours as needed for palpitations.   *If you need a refill on your cardiac medications before your next appointment, please call your pharmacy*   Lab Work: None If you have labs (blood work) drawn today and your tests are completely normal, you will receive your results only by: MyChart Message (if you have MyChart) OR A paper copy in the mail If you have any lab test that is abnormal or we need to change your treatment, we will call you to review the results.   Testing/Procedures: None   Follow-Up: At [facility], you and your health needs are our priority.  As part of our continuing mission to provide you with exceptional heart care, we have created designated Provider Care Teams.  These Care Teams include your primary Cardiologist (physician) and Advanced Practice Providers (APPs -  Physician Assistants and Nurse Practitioners) who all work together to provide you with the care you need, when you need it.  We recommend signing up for the patient portal called "MyChart".  Sign up information is provided on this After Visit Summary.  MyChart is used to connect with patients for Virtual Visits (Telemedicine).  Patients are able to view lab/test results, encounter notes, upcoming appointments, etc.  Non-urgent messages can be sent to your provider as well.   To learn more about what you can do with MyChart, go to [email or web address].    Your next appointment:   6 month(s)  Provider:   You may see Vishnu Norton Pastel, MD or the following Advanced Practice Provider on your designated Care Team:   Sharlene Dory, NP    Other Instructions

## 2022-10-06 NOTE — Progress Notes (Signed)
Cardiology Office Note  Date: 10/06/2022   ID: Denise Benton, DOB [birth year], MRN [id number]  PCP:  Gabriel Earing, FNP  Cardiologist:  Marjo Bicker, MD Electrophysiologist:  None    History of Present Illness: Denise Benton is a 72 y.o. female known to have HLD is here for follow-up visit of palpitations.  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 She has been having palpitations for quite a while, lasting for 10 to 15 minutes and underwent event monitor recent in 03/2022.  The event monitor is remarkable for NSR, 1 run of 7 beat NSVT, 12 runs of brief asymptomatic SVT (longest lasting 6.5 seconds). Less than 1% PAC and less than 1% PVC burden was noted.  I reviewed her medical records from [facility] since 2010.  She underwent event monitor which showed NSVT and no other major arrhythmias. Echocardiogram was within normal limits, normal LVEF and no valve abnormalities. She also underwent stress echo which was normal. Although she did have positive exercise tolerance test in 2012 but negative stress echo years later. She was scheduled to undergo CT calcium scoring which, to her knowledge, might be performed at [facility].  She informed me that she would reach out to them and get a CT calcium scoring report. She has a strong family history of CAD.  Patient is here for follow-up visit.   Currently on metoprolol tartrate 12.5 mg twice daily, I reviewed her home BP log that showed elevated blood pressures at bedtime and early a.m., ranging between 140 and 170 mmHg.  Denies any palpitations, no chest pain or DOE.  No orthopnea, PND, dizziness, presyncope and syncope.  No leg swelling.    Past Medical History:  Diagnosis Date   Allergy    seasonal   Arthritis    Asthma    Duodenal ulcer    Gallstones    GERD (gastroesophageal reflux disease)     HLD (hyperlipidemia)    IBS (irritable bowel syndrome)    Kidney stones    Lumbosacral disc disease    L4-5, L5-S1   Osteopenia     Past Surgical History:  Procedure Laterality Date   BREAST CYST ASPIRATION Right    pt has cysts aspiration done for lumps   CARPOMETACARPAL (CMC) FUSION OF THUMB  02/2022   CESAREAN SECTION  1980   x2; again in 1984   LAPAROSCOPIC CHOLECYSTECTOMY  2000   SKIN LESION EXCISION  07/2011   from scalp - keratosis    Current Outpatient Medications  Medication Sig Dispense Refill   acetaminophen (TYLENOL) 325 MG tablet Take 325 mg by mouth as needed.     albuterol (VENTOLIN HFA) 108 (90 Base) MCG/ACT inhaler INHALE 1-2 PUFFS BY MOUTH EVERY 6 HOURS AS NEEDED FOR WHEEZE OR SHORTNESS OF BREATH 8.5 each 0   aspirin EC 81 MG tablet Take 81 mg by mouth daily. Swallow whole.     B Complex Vitamins (VITAMIN-B COMPLEX PO) Take 1 tablet by mouth daily.     Biotin 41660 MCG TABS Take by mouth daily.     chlorhexidine (PERIDEX) 0.12 % solution SMARTSIG:15 By Mouth Morning-Evening     fluticasone-salmeterol (ADVAIR HFA) 115-21 MCG/ACT inhaler Inhale 2 puffs into the lungs 2 (two) times daily. As needed     folic acid (FOLVITE) 800 MCG tablet Take 400 mcg by mouth once a week.  ibuprofen (ADVIL) 600 MG tablet Take 1 tablet (600 mg total) by mouth every 8 (eight) hours as needed for moderate pain. 30 tablet 1   levOCARNitine-E-Coenzyme Q10 (CO Q-10 PLUS L-CARNITINE PO) Take 1 tablet by mouth daily.     metoprolol tartrate (LOPRESSOR) 25 MG tablet Take 0.5 tablets (12.5 mg total) by mouth 2 (two) times daily.     Multiple Vitamin (MULTIVITAMIN) tablet Take 2 tablets by mouth daily.     RABEprazole (ACIPHEX) 20 MG tablet Take 1 tablet (20 mg total) by mouth daily. 60 tablet 2   sucralfate (CARAFATE) 1 g tablet Take 1 tablet (1 g total) by mouth 2 (two) times daily. 60 tablet 2   No current facility-administered medications for this visit.   Allergies:  Ivp dye  [iodinated contrast media], Covid-19 (mrna) vaccine, Nystatin, Codeine, and Niaspan [niacin er]   Social History: The patient  reports that she quit smoking about 53 years ago. Her smoking use included cigarettes. She started smoking about 54 years ago. She has a 0.5 pack-year smoking history. She has never used smokeless tobacco. She reports that she does not drink alcohol and does not use drugs.   Family History: The patient's family history includes Anxiety disorder in her daughter; Congestive Heart Failure in her father; Dementia in her mother; Diabetes in her father and paternal grandfather; Heart disease in her father and sister; Hyperlipidemia in her mother; Hypertension in her daughter; Kidney Stones in her sister; Multiple sclerosis in her sister; Skin cancer in her mother; Tremor in her mother and sister.   ROS:  Please see the history of present illness. Otherwise, complete review of systems is positive for none  All other systems are reviewed and negative.   Physical Exam: VS:  Ht 5\' 1"  (1.549 m)   Wt 132 lb (59.9 kg)   BMI 24.94 kg/m , BMI Body mass index is 24.94 kg/m.  Wt Readings from Last 3 Encounters:  10/06/22 132 lb (59.9 kg)  09/19/22 127 lb (57.6 kg)  09/16/22 127 lb (57.6 kg)    General: Patient appears comfortable at rest. HEENT: Conjunctiva and lids normal, oropharynx clear with moist mucosa. Neck: Supple, no elevated JVP or carotid bruits, no thyromegaly. Lungs: Clear to auscultation, nonlabored breathing at rest. Cardiac: Regular rate and rhythm, no S3 or significant systolic murmur, no pericardial rub. Abdomen: Soft, nontender, no hepatomegaly, bowel sounds present, no guarding or rebound. Extremities: No pitting edema, distal pulses 2+. Skin: Warm and dry. Musculoskeletal: No kyphosis. Neuropsychiatric: Alert and oriented x3, affect grossly appropriate.  Recent Labwork: 03/19/2022: ALT 18; AST 25; BUN 22; Creatinine, Ser 0.93; Hemoglobin 14.2; Platelets 348;  Potassium 4.5; Sodium 143; TSH 4.020     Component Value Date/Time   CHOL 208 (H) 09/05/2021 0918   TRIG 110 09/05/2021 0918   HDL 78 09/05/2021 0918   CHOLHDL 2.7 09/05/2021 0918   LDLCALC 111 (H) 09/05/2021 0918    Other Studies Reviewed Today: I personally reviewed the medical records from [facility] which the patient brought with her today.   Event monitor from 03/2022 was remarkable for 1 run of 7 beat NSVT. Otherwise there are 12 brief asymptomatic SVT episodes. Event monitor at [facility] also showed NSVT, 1 episode and brief SVT episodes.   I reviewed the medical records at [facility].  Patient had positive exercise tolerance test in 2012 but negative stress echo years later.  She was scheduled to undergo CT calcium scoring test which she  said she underwent at [facility].   Assessment and Plan: Patient is a 72 year old F known to have HLD is here for follow-up visit.  Palpitations NSVT, 1 run HTN, partially controlled Family history of CAD   -I reviewed home BP log which showed elevated blood pressures at bedtime and early a.m. ranging between 140 and 170 mmHg SBP.  No interval palpitations.  Switch metoprolol to diltiazem 60 mg every 8 hours as needed palpitations. Start chlorthalidone 25 mg at bedtime daily.   Medication Adjustments/Labs and Tests Ordered: Current medicines are reviewed at length with the patient today.  Concerns regarding medicines are outlined above.   Tests Ordered: No orders of the defined types were placed in this encounter.   Medication Changes: No orders of the defined types were placed in this encounter.   Disposition:  Follow up  6 months  Signed Elizeo Rodriques Verne Spurr, MD, 10/06/2022 11:48 AM    [facility] at [facility] [address] [place], [place], [place] [address]

## 2022-10-07 ENCOUNTER — Encounter: Payer: Self-pay | Admitting: Internal Medicine

## 2022-10-10 ENCOUNTER — Telehealth: Payer: Self-pay

## 2022-10-10 NOTE — Telephone Encounter (Signed)
Called patient regarding medications that she was stating that was on back order has now been picked up. Stated that she takes Diltiazem as needed, but was told there is an agent in the medication that she may be allergic to that it is in [employer] vaccine. Polyethylene Glycol 3350 she will not know if the medication will give her a reaction until she takes it. Will contact the office if she develops any symptoms when she does take it.

## 2022-10-13 ENCOUNTER — Telehealth: Payer: Self-pay | Admitting: Internal Medicine

## 2022-10-13 ENCOUNTER — Telehealth: Payer: Self-pay | Admitting: Family Medicine

## 2022-10-13 NOTE — Telephone Encounter (Signed)
Patient states she woke up this morning and her BP was 90/67 HR 98 She felt very lightheaded and shaky  After going to eat her HR was 124 after resting it seemed to come down   10 AM 118/70 HR 121  Patient just switched her Metoprolol to chlorthalidone per Dr.Malippeddi shew is wondering if she can cut this medication in half to see if this will help her feel better   As the day went on she feels much better  Patient has not taken any since yesterday morning to see if this would help and she feels much better.    Her current after going up stairs to take her BP is 138/84  HR is 119  She has been taking her chlorthalidone in the morning   She feels if she decreases her dose of the new medication this will help she would like to continue taking this medication Stated in your last note you advised taking it at night time I advised her to start doing this to see how this does

## 2022-10-13 NOTE — Telephone Encounter (Signed)
Pt is leaving to go to [place] and had questions regarding which vaccines she will need. She has a list of vaccines-yellow fever, typhoid, rabies etc. I advised pt we don't have those available here in our office and to call the local health department and pt voiced understanding.

## 2022-10-13 NOTE — Telephone Encounter (Signed)
Patient not at home. Husband stated she may stop by the office

## 2022-10-13 NOTE — Telephone Encounter (Signed)
See previous encounter. Patient is returning call.

## 2022-10-13 NOTE — Telephone Encounter (Signed)
Pt c/o medication issue:  1. Name of Medication: Thalitone  2. How are you currently taking this medication (dosage and times per day)? 1 time a day  3. Are you having a reaction (difficulty breathing--STAT)?   4. What is your medication issue? 100 to 124 heart rate, seen a few PVC's- her blood pressure is 90/70- she really feels shaky   STAT if HR is under 50 or over 120 (normal HR is 60-100 beats per minute)  What is your heart rate? 100 to 124 this morning  Do you have a log of your heart rate readings (document readings)?   Do you have any other symptoms? feels really shaky and a little lightheaded

## 2022-10-13 NOTE — Telephone Encounter (Signed)
Pt called stating that she is going out of the country on 11/2 and needs to make sure she is up to date on all immunizations.

## 2022-10-14 MED ORDER — CHLORTHALIDONE 25 MG PO TABS: 12.5000 mg | ORAL_TABLET | Freq: Every day | ORAL | Status: DC

## 2022-10-14 NOTE — Addendum Note (Signed)
Addended by: Sharen Hones on: 10/14/2022 04:21 PM   Modules accepted: Orders

## 2022-10-14 NOTE — Telephone Encounter (Signed)
Left voicemail and MyChart message sent.

## 2022-10-17 ENCOUNTER — Encounter: Payer: Self-pay | Admitting: Family Medicine

## 2022-10-17 ENCOUNTER — Ambulatory Visit (INDEPENDENT_AMBULATORY_CARE_PROVIDER_SITE_OTHER): Payer: Medicare Other | Admitting: Family Medicine

## 2022-10-17 VITALS — BP 115/60 | HR 79 | Temp 98.1°F | Ht 61.0 in | Wt 127.2 lb

## 2022-10-17 DIAGNOSIS — Z2989 Encounter for other specified prophylactic measures: Secondary | ICD-10-CM | POA: Diagnosis not present

## 2022-10-17 DIAGNOSIS — A09 Infectious gastroenteritis and colitis, unspecified: Secondary | ICD-10-CM | POA: Diagnosis not present

## 2022-10-17 MED ORDER — ATOVAQUONE-PROGUANIL HCL 250-100 MG PO TABS
ORAL_TABLET | ORAL | 0 refills | Status: DC
Start: 2022-10-17 — End: 2022-12-31

## 2022-10-17 MED ORDER — AZITHROMYCIN 250 MG PO TABS
ORAL_TABLET | ORAL | 0 refills | Status: AC
Start: 2022-10-17 — End: 2022-10-22

## 2022-10-17 NOTE — Progress Notes (Signed)
   Acute Office Visit  Subjective:     Patient ID: Denise Benton, female    DOB: [birth year], 72 y.o.   MRN: [id number]  Chief Complaint  Patient presents with   Medical Management of Chronic Issues    HPI Patient is in today for malaria prophylaxis. She will be traveling on November 2nd to [place]. She will be there for 9 days. She is requesting a prescription for malaria prophylaxis. She has traveled to [place] in the past and taken Malarone without complications. She is also requesting medication to have on hand in the event of traveler's diarrhea. She has gotten her Yellow Fever vaccination recently and plans to return for flu vaccination prior to trip.   ROS As per HPI.      Objective:    BP 115/60   Pulse 79   Temp 98.1 F (36.7 C) (Temporal)   Ht 5\' 1"  (1.549 m)   Wt 127 lb 4 oz (57.7 kg)   SpO2 99%   BMI 24.04 kg/m    Physical Exam Vitals and nursing note reviewed.  Constitutional:      General: She is not in acute distress.    Appearance: Normal appearance. She is not ill-appearing, toxic-appearing or diaphoretic.  Pulmonary:     Effort: Pulmonary effort is normal. No respiratory distress.  Musculoskeletal:     Right lower leg: No edema.     Left lower leg: No edema.  Skin:    General: Skin is warm and dry.  Neurological:     General: No focal deficit present.     Mental Status: She is alert and oriented to person, place, and time.  Psychiatric:        Mood and Affect: Mood normal.        Behavior: Behavior normal.        Thought Content: Thought content normal.        Judgment: Judgment normal.     No results found for any visits on 10/17/22.      Assessment & Plan:   [place] "Mariane Duval" was seen today for medical management of chronic issues.  Diagnoses and all orders for this visit:  Need for malaria prophylaxis Malarone ordered as below.  -     atovaquone-proguanil (MALARONE) 250-100 MG TABS tablet; Take 1 tablet once daily. Start 1 to 2 days  prior to entering a malaria-endemic area, continue throughout the stay and for 7 days after returning  Traveler's diarrhea Azithromycin as below ordered in the event on traveler's diarrhea.  -     azithromycin (ZITHROMAX) 250 MG tablet; Take 2 tablets once daily for 3 days for traveler's diarrhea.  Follow up as scheduled.   The patient indicates understanding of these issues and agrees with the plan.    Gabriel Earing, FNP

## 2022-11-10 ENCOUNTER — Encounter: Payer: Self-pay | Admitting: Gastroenterology

## 2022-11-12 ENCOUNTER — Ambulatory Visit: Payer: Medicare Other

## 2022-11-20 ENCOUNTER — Ambulatory Visit: Payer: Medicare Other | Admitting: Internal Medicine

## 2022-11-26 ENCOUNTER — Ambulatory Visit (INDEPENDENT_AMBULATORY_CARE_PROVIDER_SITE_OTHER): Payer: Medicare Other | Admitting: Family Medicine

## 2022-11-26 ENCOUNTER — Encounter: Payer: Self-pay | Admitting: Family Medicine

## 2022-11-26 VITALS — BP 102/60 | HR 68 | Temp 97.8°F | Ht 61.0 in | Wt 126.0 lb

## 2022-11-26 DIAGNOSIS — A09 Infectious gastroenteritis and colitis, unspecified: Secondary | ICD-10-CM

## 2022-11-26 MED ORDER — ONDANSETRON HCL 4 MG PO TABS: 4.0000 mg | ORAL_TABLET | Freq: Three times a day (TID) | ORAL | 0 refills | Status: DC | PRN

## 2022-11-26 NOTE — Progress Notes (Signed)
Acute Office Visit  Subjective:     Patient ID: Denise Benton, female    DOB: 03/20/1950, 72 y.o.   MRN: [id number]  Chief Complaint  Patient presents with   Diarrhea    Diarrhea  This is a new problem. Episode onset: 10 days. The problem occurs less than 2 times per day. The problem has been unchanged. The stool consistency is described as Watery. Associated symptoms include abdominal pain (cramping) and bloating. Pertinent negatives include no chills, fever, increased  flatus, vomiting or weight loss. Associated symptoms comments: nausea. Risk factors include travel to endemic area (returned from [place] 3 days ago). She has tried anti-motility drug for the symptoms. The treatment provided mild relief.  She has had some intermittently lightheadedness. Trying to stay well hydrated. Drinking propel. Hasn't been eating much as she has diarrhea after eating anything. Has been on Malarone for malaria prophalaxis. Has azithromycin to take for travelers diarrhea but hasn't started it. Also has albendazole but hasn't taken this yet.   Review of Systems  Constitutional:  Negative for chills, fever and weight loss.  Gastrointestinal:  Positive for abdominal pain (cramping), bloating and diarrhea. Negative for flatus and vomiting.        Objective:    BP 102/60   Pulse 68   Temp 97.8 F (36.6 C) (Temporal)   Ht 5\' 1"  (1.549 m)   Wt 126 lb (57.2 kg)   SpO2 99%   BMI 23.81 kg/m  Wt Readings from Last 3 Encounters:  11/26/22 126 lb (57.2 kg)  10/17/22 127 lb 4 oz (57.7 kg)  10/06/22 132 lb (59.9 kg)      Physical Exam Vitals and nursing note reviewed.  Constitutional:      General: She is not in acute distress.    Appearance: Normal appearance. She is not ill-appearing, toxic-appearing or diaphoretic.  HENT:     Head: Normocephalic and atraumatic.  Eyes:     General: No scleral icterus. Cardiovascular:     Rate and Rhythm: Normal rate and regular rhythm.     Heart sounds:  Normal heart sounds. No murmur heard. Pulmonary:     Effort: Pulmonary effort is normal. No respiratory distress.     Breath sounds: Normal breath sounds.  Abdominal:     General: Bowel sounds are normal. There is no distension.     Palpations: Abdomen is soft.     Tenderness: There is generalized abdominal tenderness. There is no guarding or rebound.  Musculoskeletal:     Cervical back: No rigidity.     Right lower leg: No edema.     Left lower leg: No edema.  Skin:    General: Skin is warm and dry.     Coloration: Skin is not jaundiced.  Neurological:     General: No focal deficit present.     Mental Status: She is alert and oriented to person, place, and time.  Psychiatric:        Mood and Affect: Mood normal.        Behavior: Behavior normal.     No results found for any visits on 11/26/22.      Assessment & Plan:   Denise Benton "Denise Benton" was seen today for diarrhea.  Diagnoses and all orders for this visit:  Traveler's diarrhea Collect stool culture. She will start azithromycin that was previously sent in once she collects stool culture. Take albendazole. Will check labs as below. Push hydration and electrolytes. Return to office for new or worsening  symptoms, or if symptoms persist.  -     Cdiff NAA+O+P+Stool Culture -     ondansetron (ZOFRAN) 4 MG tablet; Take 1 tablet (4 mg total) by mouth every 8 (eight) hours as needed for nausea or vomiting. -     CBC with Differential/Platelet -     CMP14+EGFR  The patient indicates understanding of these issues and agrees with the plan.  Gabriel Earing, FNP

## 2022-11-27 ENCOUNTER — Other Ambulatory Visit: Payer: Self-pay

## 2022-11-27 ENCOUNTER — Other Ambulatory Visit: Payer: Medicare Other

## 2022-11-27 DIAGNOSIS — R7989 Other specified abnormal findings of blood chemistry: Secondary | ICD-10-CM

## 2022-11-27 LAB — CBC WITH DIFFERENTIAL/PLATELET
Basophils Absolute: 0.1 10*3/uL (ref 0.0–0.2)
Basos: 1 %
EOS (ABSOLUTE): 0.1 10*3/uL (ref 0.0–0.4)
Eos: 1 %
Hematocrit: 43.2 % (ref 34.0–46.6)
Hemoglobin: 13.9 g/dL (ref 11.1–15.9)
Immature Grans (Abs): 0 10*3/uL (ref 0.0–0.1)
Immature Granulocytes: 1 %
Lymphocytes Absolute: 2.2 10*3/uL (ref 0.7–3.1)
Lymphs: 26 %
MCH: 31.7 pg (ref 26.6–33.0)
MCHC: 32.2 g/dL (ref 31.5–35.7)
MCV: 98 fL — ABNORMAL HIGH (ref 79–97)
Monocytes Absolute: 0.7 10*3/uL (ref 0.1–0.9)
Monocytes: 9 %
Neutrophils Absolute: 5.4 10*3/uL (ref 1.4–7.0)
Neutrophils: 62 %
Platelets: 449 10*3/uL (ref 150–450)
RBC: 4.39 x10E6/uL (ref 3.77–5.28)
RDW: 12.1 % (ref 11.7–15.4)
WBC: 8.5 10*3/uL (ref 3.4–10.8)

## 2022-11-27 LAB — CMP14+EGFR
ALT: 18 [IU]/L (ref 0–32)
AST: 24 [IU]/L (ref 0–40)
Albumin: 4.2 g/dL (ref 3.8–4.8)
Alkaline Phosphatase: 93 [IU]/L (ref 44–121)
BUN/Creatinine Ratio: 19 (ref 12–28)
BUN: 22 mg/dL (ref 8–27)
Bilirubin Total: 0.4 mg/dL (ref 0.0–1.2)
CO2: 28 mmol/L (ref 20–29)
Calcium: 9.6 mg/dL (ref 8.7–10.3)
Chloride: 97 mmol/L (ref 96–106)
Creatinine, Ser: 1.18 mg/dL — ABNORMAL HIGH (ref 0.57–1.00)
Globulin, Total: 2.3 g/dL (ref 1.5–4.5)
Glucose: 85 mg/dL (ref 70–99)
Potassium: 3.5 mmol/L (ref 3.5–5.2)
Sodium: 143 mmol/L (ref 134–144)
Total Protein: 6.5 g/dL (ref 6.0–8.5)
eGFR: 49 mL/min/{1.73_m2} — ABNORMAL LOW (ref >59)

## 2022-12-02 LAB — CDIFF NAA+O+P+STOOL CULTURE
E coli, Shiga toxin Assay: NEGATIVE
Toxigenic C. Difficile by PCR: NEGATIVE

## 2022-12-19 ENCOUNTER — Encounter: Payer: Medicare Other | Admitting: Gastroenterology

## 2022-12-22 ENCOUNTER — Encounter: Payer: Self-pay | Admitting: Gastroenterology

## 2022-12-31 ENCOUNTER — Encounter: Payer: Self-pay | Admitting: Family Medicine

## 2022-12-31 ENCOUNTER — Telehealth: Payer: Self-pay

## 2022-12-31 ENCOUNTER — Ambulatory Visit (INDEPENDENT_AMBULATORY_CARE_PROVIDER_SITE_OTHER): Payer: Medicare Other

## 2022-12-31 ENCOUNTER — Ambulatory Visit: Payer: Medicare Other | Admitting: Family Medicine

## 2022-12-31 ENCOUNTER — Other Ambulatory Visit: Payer: Self-pay | Admitting: *Deleted

## 2022-12-31 ENCOUNTER — Ambulatory Visit (AMBULATORY_SURGERY_CENTER): Payer: Medicare Other

## 2022-12-31 VITALS — BP 107/60 | HR 70 | Temp 98.0°F | Ht 61.0 in | Wt 126.0 lb

## 2022-12-31 VITALS — Ht 61.0 in | Wt 126.0 lb

## 2022-12-31 DIAGNOSIS — M545 Low back pain, unspecified: Secondary | ICD-10-CM | POA: Diagnosis not present

## 2022-12-31 DIAGNOSIS — M51362 Other intervertebral disc degeneration, lumbar region with discogenic back pain and lower extremity pain: Secondary | ICD-10-CM

## 2022-12-31 DIAGNOSIS — K219 Gastro-esophageal reflux disease without esophagitis: Secondary | ICD-10-CM

## 2022-12-31 DIAGNOSIS — Z1211 Encounter for screening for malignant neoplasm of colon: Secondary | ICD-10-CM

## 2022-12-31 DIAGNOSIS — Z8711 Personal history of peptic ulcer disease: Secondary | ICD-10-CM

## 2022-12-31 DIAGNOSIS — M25512 Pain in left shoulder: Secondary | ICD-10-CM

## 2022-12-31 MED ORDER — PLENVU 140 G PO SOLR: 1.0000 | Freq: Once | ORAL | 0 refills | Status: AC

## 2022-12-31 NOTE — Progress Notes (Signed)
Acute Office Visit  Subjective:     Patient ID: Denise Benton, female    DOB: 1951-01-04, 72 y.o.   MRN: [id number]  Chief Complaint  Patient presents with   Shoulder Pain    Shoulder Pain  The pain is present in the neck, back, left shoulder, left arm and left elbow. This is a new problem. Episode onset: a few months. There has been a history of trauma (fell in [place] on November 5th when walking on uneven rocky ground- landed on left outstretched hand and rolled over onto left side). The problem has been gradually worsening. The quality of the pain is described as aching. Pain scale: 6-9/10. She has tried acetaminophen, NSAIDS, OTC ointments and rest (hydrocodone, chiropractor) for the symptoms. The treatment provided no relief.   Pain is constant ache in left shoulder with intermittent radiation down left arm to elbow, up to left side of neck, and left side of back, especially lower bilateral back. Pain in shoulder is worse at night, rolling over in bed, reaching back with hand outstretched aggravates pain. Sometimes has tingling in left lateral arm down to elbow. Pain in lower back in pressure. No radiating pain, numbness, tingling, or changes in bowel or bladder control. 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 Pain is neck is achy, intermittent.   ROS As per HPI.     Objective:    BP 107/60   Pulse 70   Temp 98 F (36.7 C) (Temporal)   Ht 5\' 1"  (1.549 m)   Wt 126 lb (57.2 kg)   SpO2 99%   BMI 23.81 kg/m    Physical Exam Vitals and nursing note reviewed.  Constitutional:      General: She is not in acute distress.    Appearance: She is not ill-appearing, toxic-appearing or diaphoretic.  Pulmonary:     Effort: Pulmonary effort is normal. No respiratory distress.  Musculoskeletal:     Left shoulder: Tenderness and bony tenderness present. No swelling, deformity or effusion. Decreased strength.     Lumbar back: Tenderness present. No swelling, edema,  deformity or bony tenderness.     Right lower leg: No edema.     Left lower leg: No edema.     Comments: + empty can test on left.   Skin:    General: Skin is warm and dry.  Neurological:     General: No focal deficit present.     Mental Status: She is alert and oriented to person, place, and time.     Gait: Gait normal.  Psychiatric:        Mood and Affect: Mood normal.        Behavior: Behavior normal.     No results found for any visits on 12/31/22.      Assessment & Plan:   [place] "Mariane Duval" was seen today for shoulder pain.  Diagnoses and all orders for this visit:  Acute pain of left shoulder ? Rotator cuff pathology. Xray today in office, radiology report pending. Discussed referral to ortho may be needed pending report.  -     DG Shoulder Left; Future  Acute bilateral low back pain without sciatica No red flags. Xray today in office, report pending. Will notify patient of results and plan of care pending results.  -     DG Lumbar Spine 2-3 Views; Future  Return to office for new or worsening symptoms, or if symptoms persist.   The patient indicates understanding of these issues  and agrees with the plan.  Gabriel Earing, FNP

## 2022-12-31 NOTE — Telephone Encounter (Signed)
PV in progress  

## 2022-12-31 NOTE — Progress Notes (Signed)
No egg or soy allergy known to patient  No issues known to pt with past sedation with any surgeries or procedures Patient denies ever being told they had issues or difficulty with intubation  No FH of Malignant Hyperthermia Pt is not on diet pills Pt is not on  home 02  Pt is not on blood thinners  Pt reports issues constipation feeling like stool stuck. Painful stools. Pt reports a fall hurting a old spine injury this has recently started since then pt is not sure if this associated. Has been taking fiber OTC and increased water intake.  No A fib or A flutter. Hx of bradycardia  Have any cardiac testing pending-- no  LOA: independent  Prep: plenvu   Patient's chart reviewed by Cathlyn Parsons CNRA prior to previsit and patient appropriate for the [facility].  Previsit completed and red dot placed by patient's name on their procedure day (on provider's schedule).     PV competed with patient. Prep instructions sent via mychart and home address. Goodrx coupon for provided to use for price reduction if needed.

## 2023-01-01 ENCOUNTER — Telehealth: Payer: Self-pay | Admitting: Family Medicine

## 2023-01-01 NOTE — Telephone Encounter (Signed)
Referral placed.

## 2023-01-01 NOTE — Telephone Encounter (Signed)
Patient called stating that she was able to get off the 26th so she will be able to follow through with her colonoscopy on 12/27

## 2023-01-01 NOTE — Telephone Encounter (Signed)
Copied from CRM [id number]. Topic: General - Other >> Jan 01, 2023  9:31 AM Antony Haste wrote: Reason for CRM: PT states she would like to inform PCP of name of Physician for Orthopedics. Dr. Rennis Chris.

## 2023-01-01 NOTE — Telephone Encounter (Signed)
Left detailed message.   

## 2023-01-08 ENCOUNTER — Telehealth: Payer: Self-pay | Admitting: Gastroenterology

## 2023-01-08 NOTE — Telephone Encounter (Signed)
Inbound call from patient, procedure was previously rescheduled her procedure and was scheduled 1/3 at 1:30 PM. Patient would like updated instructions sent to her MyChart.

## 2023-01-09 ENCOUNTER — Encounter: Payer: Medicare Other | Admitting: Gastroenterology

## 2023-01-09 NOTE — Telephone Encounter (Signed)
LM informing pt tht new set of instructions have been sent with new dates and times to her My Chart account. Instructed to call if had any questions. # given

## 2023-01-16 ENCOUNTER — Ambulatory Visit: Payer: Medicare Other | Admitting: Gastroenterology

## 2023-01-16 ENCOUNTER — Encounter: Payer: Self-pay | Admitting: Gastroenterology

## 2023-01-16 ENCOUNTER — Telehealth: Payer: Self-pay

## 2023-01-16 VITALS — BP 126/70 | HR 72 | Temp 98.3°F | Resp 11 | Ht 61.0 in | Wt 126.0 lb

## 2023-01-16 DIAGNOSIS — K2289 Other specified disease of esophagus: Secondary | ICD-10-CM | POA: Diagnosis not present

## 2023-01-16 DIAGNOSIS — Z1211 Encounter for screening for malignant neoplasm of colon: Secondary | ICD-10-CM | POA: Diagnosis not present

## 2023-01-16 DIAGNOSIS — Z8711 Personal history of peptic ulcer disease: Secondary | ICD-10-CM

## 2023-01-16 DIAGNOSIS — K5520 Angiodysplasia of colon without hemorrhage: Secondary | ICD-10-CM | POA: Diagnosis not present

## 2023-01-16 DIAGNOSIS — K644 Residual hemorrhoidal skin tags: Secondary | ICD-10-CM

## 2023-01-16 DIAGNOSIS — D122 Benign neoplasm of ascending colon: Secondary | ICD-10-CM

## 2023-01-16 DIAGNOSIS — K2970 Gastritis, unspecified, without bleeding: Secondary | ICD-10-CM | POA: Diagnosis not present

## 2023-01-16 DIAGNOSIS — K229 Disease of esophagus, unspecified: Secondary | ICD-10-CM

## 2023-01-16 DIAGNOSIS — K2960 Other gastritis without bleeding: Secondary | ICD-10-CM

## 2023-01-16 DIAGNOSIS — K5730 Diverticulosis of large intestine without perforation or abscess without bleeding: Secondary | ICD-10-CM

## 2023-01-16 DIAGNOSIS — K648 Other hemorrhoids: Secondary | ICD-10-CM

## 2023-01-16 DIAGNOSIS — K641 Second degree hemorrhoids: Secondary | ICD-10-CM

## 2023-01-16 MED ORDER — HYDROCORTISONE ACETATE 25 MG RE SUPP: 25.0000 mg | Freq: Every evening | RECTAL | 1 refills | Status: DC

## 2023-01-16 MED ORDER — SODIUM CHLORIDE 0.9 % IV SOLN: 500.0000 mL | Freq: Once | INTRAVENOUS | Status: DC

## 2023-01-16 NOTE — Progress Notes (Signed)
 Sedate, gd SR, tolerated procedure well, VSS, report to RN

## 2023-01-16 NOTE — Op Note (Signed)
 [facility] Patient Name: Denise  Benton Procedure Date: 01/16/2023 1:30 PM MRN: [id number] Endoscopist: Aloha Finner , MD, [id number] Age: 73 Referring MD:  Date of Birth: 1950-05-29 Gender: Female Account #: [id number] Procedure:                Upper GI endoscopy Indications:              Follow-up of gastric ulcer Medicines:                Monitored Anesthesia Care Procedure:                Pre-Anesthesia Assessment:                           - Prior to the procedure, a History and Physical                            was performed, and patient medications and                            allergies were reviewed. The patient's tolerance of                            previous anesthesia was also reviewed. The risks                            and benefits of the procedure and the sedation                            options and risks were discussed with the patient.                            All questions were answered, and informed consent                            was obtained. Prior Anticoagulants: The patient has                            taken no anticoagulant or antiplatelet agents                            except for NSAID medication. ASA Grade Assessment:                            II - A patient with mild systemic disease. After                            reviewing the risks and benefits, the patient was                            deemed in satisfactory condition to undergo the                            procedure.  After obtaining informed consent, the endoscope was                            passed under direct vision. Throughout the                            procedure, the patient's blood pressure, pulse, and                            oxygen saturations were monitored continuously. The                            Olympus Scope [id number] was introduced through the                            mouth, and advanced to the second part of  duodenum.                            The upper GI endoscopy was accomplished without                            difficulty. The patient tolerated the procedure. Scope In: Scope Out: Findings:                 No gross lesions were noted in the entire esophagus.                           The Z-line was irregular and was found 36 cm from                            the incisors.                           Patchy mild inflammation characterized by erosions                            and erythema was found in the gastric antrum.                           No other gross lesions were noted in the entire                            examined stomach. Biopsies were taken with a cold                            forceps for histology and Helicobacter pylori                            testing.                           No gross lesions were noted in the duodenal bulb,  in the first portion of the duodenum and in the                            second portion of the duodenum. Complications:            No immediate complications. Estimated Blood Loss:     Estimated blood loss was minimal. Impression:               - No gross lesions in the entire esophagus.                           - Z-line irregular, 36 cm from the incisors.                           - Antral gastritis. No other gross lesions in the                            entire stomach. Biopsied.                           - No gross lesions in the duodenal bulb, in the                            first portion of the duodenum and in the second                            portion of the duodenum. Recommendation:           - Proceed to scheduled colonoscopy.                           - Continue present medications.                           - Await pathology results.                           - The findings and recommendations were discussed                            with the patient.                           - The findings  and recommendations were discussed                            with the patient's family. Aloha Finner, MD 01/16/2023 2:06:18 PM

## 2023-01-16 NOTE — Op Note (Addendum)
 [facility] Patient Name: Denise Benton Procedure Date: 01/16/2023 1:29 PM MRN: [id number] Endoscopist: Aloha Finner , MD, [id number] Age: 73 Referring MD:  Date of Birth: 01-09-1951 Gender: Female Account #: [id number] Procedure:                Colonoscopy Indications:              Screening for colorectal malignant neoplasm Medicines:                Monitored Anesthesia Care Procedure:                Pre-Anesthesia Assessment:                           - Prior to the procedure, a History and Physical                            was performed, and patient medications and                            allergies were reviewed. The patient's tolerance of                            previous anesthesia was also reviewed. The risks                            and benefits of the procedure and the sedation                            options and risks were discussed with the patient.                            All questions were answered, and informed consent                            was obtained. Prior Anticoagulants: The patient has                            taken no anticoagulant or antiplatelet agents                            except for NSAID medication. ASA Grade Assessment:                            II - A patient with mild systemic disease. After                            reviewing the risks and benefits, the patient was                            deemed in satisfactory condition to undergo the                            procedure.  After obtaining informed consent, the colonoscope                            was passed under direct vision. Throughout the                            procedure, the patient's blood pressure, pulse, and                            oxygen saturations were monitored continuously. The                            [id number] Colonoscope [id number] was introduced                            through the anus and advanced to the  3 cm into the                            ileum. The colonoscopy was performed without                            difficulty. The patient tolerated the procedure.                            The quality of the bowel preparation was good. The                            terminal ileum, ileocecal valve, appendiceal                            orifice, and rectum were photographed. Scope In: 1:46:02 PM Scope Out: 1:57:21 PM Scope Withdrawal Time: 0 hours 9 minutes 6 seconds  Total Procedure Duration: 0 hours 11 minutes 19 seconds  Findings:                 The digital rectal exam findings include                            hemorrhoids. Pertinent negatives include no                            palpable rectal lesions.                           The terminal ileum and ileocecal valve appeared                            normal.                           A single medium-sized angioectasia with typical                            arborization was found in the cecum.  A 3 mm polyp was found in the ascending colon. The                            polyp was sessile. The polyp was removed with a                            cold snare. Resection and retrieval were complete.                           Multiple small-mouthed diverticula were found in                            the recto-sigmoid colon and sigmoid colon.                           Normal mucosa was found in the entire colon                            otherwise.                           Non-bleeding non-thrombosed external and internal                            hemorrhoids were found during retroflexion, during                            perianal exam and during digital exam. The                            hemorrhoids were Grade II (internal hemorrhoids                            that prolapse but reduce spontaneously). Complications:            No immediate complications. Estimated Blood Loss:     Estimated blood loss  was minimal. Impression:               - Hemorrhoids found on digital rectal exam.                           - The examined portion of the ileum was normal.                           - A single colonic angioectasia.                           - One 3 mm polyp in the ascending colon, removed                            with a cold snare. Resected and retrieved.                           - Diverticulosis in the recto-sigmoid colon and in  the sigmoid colon.                           - Normal mucosa in the entire examined colon                            otherwise.                           - Non-bleeding non-thrombosed external and internal                            hemorrhoids. Recommendation:           - The patient will be observed post-procedure,                            until all discharge criteria are met.                           - Discharge patient to home.                           - Patient has a contact number available for                            emergencies. The signs and symptoms of potential                            delayed complications were discussed with the                            patient. Return to normal activities tomorrow.                            Written discharge instructions were provided to the                            patient.                           - High fiber diet.                           - Use FiberCon 1-2 tablets PO daily.                           - Continue present medications.                           - Await pathology results.                           - Repeat colonoscopy in 5-10 years for surveillance                            based on pathology results.                           -  The findings and recommendations were discussed                            with the patient.                           - The findings and recommendations were discussed                            with the patient's  family. Aloha Finner, MD 01/16/2023 2:09:08 PM

## 2023-01-16 NOTE — Progress Notes (Signed)
 Called to room to assist during endoscopic procedure.  Patient ID and intended procedure confirmed with present staff. Received instructions for my participation in the procedure from the performing physician.

## 2023-01-16 NOTE — Telephone Encounter (Signed)
 Patient

## 2023-01-16 NOTE — Patient Instructions (Addendum)
 Resume previous diet and medications. Follow a high fiber diet. Take Fibercon 1-2 tablets daily. Repeat Colonoscopy likely in 7-10 years based on pathology results. Handouts provided on Colon polyps, Diverticulosis, Hemorrhoids and Gastritis  Use Anusol  Suppositories as prescribed until completed. May use OTC Recto-care Lidocaine Gel as needed.   YOU HAD AN ENDOSCOPIC PROCEDURE TODAY AT THE [facility]:   Refer to the procedure report that was given to you for any specific questions about what was found during the examination.  If the procedure report does not answer your questions, please call your gastroenterologist to clarify.  If you requested that your care partner not be given the details of your procedure findings, then the procedure report has been included in a sealed envelope for you to review at your convenience later.  YOU SHOULD EXPECT: Some feelings of bloating in the abdomen. Passage of more gas than usual.  Walking can help get rid of the air that was put into your GI tract during the procedure and reduce the bloating. If you had a lower endoscopy (such as a colonoscopy or flexible sigmoidoscopy) you may notice spotting of blood in your stool or on the toilet paper. If you underwent a bowel prep for your procedure, you may not have a normal bowel movement for a few days.  Please Note:  You might notice some irritation and congestion in your nose or some drainage.  This is from the oxygen used during your procedure.  There is no need for concern and it should clear up in a day or so.  SYMPTOMS TO REPORT IMMEDIATELY:  Following lower endoscopy (colonoscopy or flexible sigmoidoscopy):  Excessive amounts of blood in the stool  Significant tenderness or worsening of abdominal pains  Swelling of the abdomen that is new, acute  Fever of 100F or higher  Following upper endoscopy (EGD)  Vomiting of blood or coffee ground material  New chest pain or pain under the shoulder  blades  Painful or persistently difficult swallowing  New shortness of breath  Fever of 100F or higher  Black, tarry-looking stools  For urgent or emergent issues, a gastroenterologist can be reached at any hour by calling [phone]. Do not use MyChart messaging for urgent concerns.    DIET:  We do recommend a small meal at first, but then you may proceed to your regular diet.  Drink plenty of fluids but you should avoid alcoholic beverages for 24 hours.  ACTIVITY:  You should plan to take it easy for the rest of today and you should NOT DRIVE or use heavy machinery until tomorrow (because of the sedation medicines used during the test).    FOLLOW UP: Our staff will call the number listed on your records the next business day following your procedure.  We will call around 7:15- 8:00 am to check on you and address any questions or concerns that you may have regarding the information given to you following your procedure. If we do not reach you, we will leave a message.     If any biopsies were taken you will be contacted by phone or by letter within the next 1-3 weeks.  Please call us  at [phone] if you have not heard about the biopsies in 3 weeks.    SIGNATURES/CONFIDENTIALITY: You and/or your care partner have signed paperwork which will be entered into your electronic medical record.  These signatures attest to the fact that that the information above on your After Visit Summary has  been reviewed and is understood.  Full responsibility of the confidentiality of this discharge information lies with you and/or your care-partner.

## 2023-01-16 NOTE — Progress Notes (Signed)
 GASTROENTEROLOGY PROCEDURE H&P NOTE   Primary Care Physician: Joesph Annabella HERO, FNP  HPI: Denise Benton is a 73 y.o. female who presents for EGD/Colonoscopy followup gastric ulcers and for colon cancer screening.  Past Medical History:  Diagnosis Date   Allergy    seasonal   Arthritis    Asthma    Duodenal ulcer    Gallstones    GERD (gastroesophageal reflux disease)    HLD (hyperlipidemia)    IBS (irritable bowel syndrome)    Kidney stones    Lumbosacral disc disease    L4-5, L5-S1   Osteopenia    Past Surgical History:  Procedure Laterality Date   BREAST CYST ASPIRATION Right    pt has cysts aspiration done for lumps   CARPOMETACARPAL (CMC) FUSION OF THUMB  02/2022   CESAREAN SECTION  1980   x2; again in 1984   LAPAROSCOPIC CHOLECYSTECTOMY  2000   SKIN LESION EXCISION  07/2011   from scalp - keratosis   UPPER GASTROINTESTINAL ENDOSCOPY     Current Outpatient Medications  Medication Sig Dispense Refill   acetaminophen  (TYLENOL ) 325 MG tablet Take 325 mg by mouth as needed.     chlorthalidone  (HYGROTON ) 25 MG tablet Take 0.5 tablets (12.5 mg total) by mouth daily.     RABEprazole  (ACIPHEX ) 20 MG tablet Take 1 tablet (20 mg total) by mouth daily. 60 tablet 2   diltiazem  (CARDIZEM ) 60 MG tablet Take 1 tablet (60 mg total) by mouth every 8 (eight) hours as needed (Palpitations). (Patient not taking: Reported on 12/31/2022) 270 tablet 3   ibuprofen  (ADVIL ) 600 MG tablet Take 1 tablet (600 mg total) by mouth every 8 (eight) hours as needed for moderate pain. 30 tablet 1   Current Facility-Administered Medications  Medication Dose Route Frequency Provider Last Rate Last Admin   0.9 %  sodium chloride  infusion  500 mL Intravenous Once Mansouraty, Mikko Lewellen Jr., MD        Current Outpatient Medications:    acetaminophen  (TYLENOL ) 325 MG tablet, Take 325 mg by mouth as needed., Disp: , Rfl:    chlorthalidone  (HYGROTON ) 25 MG tablet, Take 0.5 tablets (12.5 mg total) by  mouth daily., Disp: , Rfl:    RABEprazole  (ACIPHEX ) 20 MG tablet, Take 1 tablet (20 mg total) by mouth daily., Disp: 60 tablet, Rfl: 2   diltiazem  (CARDIZEM ) 60 MG tablet, Take 1 tablet (60 mg total) by mouth every 8 (eight) hours as needed (Palpitations). (Patient not taking: Reported on 12/31/2022), Disp: 270 tablet, Rfl: 3   ibuprofen  (ADVIL ) 600 MG tablet, Take 1 tablet (600 mg total) by mouth every 8 (eight) hours as needed for moderate pain., Disp: 30 tablet, Rfl: 1  Current Facility-Administered Medications:    0.9 %  sodium chloride  infusion, 500 mL, Intravenous, Once, Mansouraty, Aloha Raddle., MD Allergies  Allergen Reactions   Covid-19 (Mrna) Vaccine     PFIZER VACCINE Flashing lights in visit, headaches, arm numbness, inability to move Flashing lights in visit, headaches, arm numbness, inability to move Flashing lights in visit, headaches, arm numbness, inability to move  Flashing lights in visit, headaches, arm numbness, inability to move Flashing lights in visit, headaches, arm numbness, inability to move   Ivp Dye [Iodinated Contrast Media] Other (See Comments)    Respiratory distress   Nystatin Other (See Comments)    Severe headaches, vomiting   Codeine Nausea Only    Some nightmares- pill form only   Niaspan [Niacin Er (Antihyperlipidemic)] Rash and Other (See  Comments)    Nightmares   Family History  Problem Relation Age of Onset   Tremor Mother    Hyperlipidemia Mother    Skin cancer Mother    Dementia Mother    Congestive Heart Failure Father    Diabetes Father    Heart disease Father        congestive heart failure   Multiple sclerosis Sister    Heart disease Sister    Kidney Stones Sister    Tremor Sister    Diabetes Paternal Grandfather    Hypertension Daughter    Anxiety disorder Daughter    Colon cancer Neg Hx    Esophageal cancer Neg Hx    Stomach cancer Neg Hx    Rectal cancer Neg Hx    Inflammatory bowel disease Neg Hx    Liver disease Neg Hx     Pancreatic cancer Neg Hx    Sleep apnea Neg Hx    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  Tobacco Use   Smoking status: Former    Current packs/day: 0.00    Average packs/day: 0.5 packs/day for 39.0 years (19.5 ttl pk-yrs)    Types: Cigarettes    Start date: 01/14/1968    Quit date: 2009    Years since quitting: 16.0   Smokeless tobacco: Never  Vaping Use   Vaping status: Never Used  Substance and Sexual Activity   Alcohol use: No   Drug use: No   Sexual activity: Not Currently  Other Topics Concern   Not on file  Social History Narrative   Lives home with husband - daughter in [place] here from [place]  in 2020   Social Drivers of Health   Financial Resource Strain: Low Risk  (09/09/2022)   Overall Financial Resource Strain (CARDIA)    Difficulty of Paying Living Expenses: Not hard at all  Food Insecurity: No Food Insecurity (09/09/2022)   Hunger Vital Sign    Worried About Running Out of Food in the Last Year: Never true    Ran Out of Food in the Last Year: Never true  Transportation Needs: No Transportation Needs (09/09/2022)   PRAPARE - Administrator, Civil Service (Medical): No    Lack of Transportation (Non-Medical): No  Physical Activity: Insufficiently Active (09/09/2022)   Exercise Vital Sign    Days of Exercise per Week: 3 days    Minutes of Exercise per Session: 30 min  Stress: No Stress Concern Present (09/09/2022)   [employer] of Occupational Health - Occupational Stress Questionnaire    Feeling of Stress : Only a little  Social Connections: Moderately Integrated (09/09/2022)   Social Connection and Isolation Panel [NHANES]     Frequency of Communication with Friends and Family: Once a week    Frequency of Social Gatherings with Friends and Family: Once a week    Attends Religious Services: More than 4 times per year    Active Member of [employer] or Organizations: Yes    Attends Banker Meetings: More than 4 times per year    Marital Status: Married  Catering Manager Violence: Not At Risk (06/16/2022)   Humiliation,  Afraid, Rape, and Kick questionnaire    Fear of Current or Ex-Partner: No    Emotionally Abused: No    Physically Abused: No    Sexually Abused: No    Physical Exam: Today's Vitals   01/16/23 1312 01/16/23 1315  BP: 138/65   Pulse: 80   Temp: 98.3 F (36.8 C) 98.3 F (36.8 C)  TempSrc: Skin   SpO2: 100%   Weight: 126 lb (57.2 kg)   Height: 5' 1 (1.549 m)   PainSc:  5    Body mass index is 23.81 kg/m. GEN: NAD EYE: Sclerae anicteric ENT: MMM CV: Non-tachycardic GI: Soft, NT/ND NEURO:  Alert & Oriented x 3  Lab Results: No results for input(s): WBC, HGB, HCT, PLT in the last 72 hours. BMET No results for input(s): NA, K, CL, CO2, GLUCOSE, BUN, CREATININE, CALCIUM in the last 72 hours. LFT No results for input(s): PROT, ALBUMIN, AST, ALT, ALKPHOS, BILITOT, BILIDIR, IBILI in the last 72 hours. PT/INR No results for input(s): LABPROT, INR in the last 72 hours.   Impression / Plan: This is a 73 y.o.female who presents for EGD/Colonoscopy followup gastric ulcers and for colon cancer screening.  The risks and benefits of endoscopic evaluation/treatment were discussed with the patient and/or family; these include but are not limited to the risk of perforation, infection, bleeding, missed lesions, lack of diagnosis, severe illness requiring hospitalization, as well as anesthesia and sedation related illnesses.  The patient's history has been reviewed, patient examined, no change in status, and deemed stable for procedure.  The patient  and/or family is agreeable to proceed.    Aloha Finner, MD [facility] Office # [phone]

## 2023-01-16 NOTE — Progress Notes (Signed)
 Cell phone off per pt  ? ?Pt's states no medical or surgical changes since previsit or office visit. ? ?

## 2023-01-19 NOTE — Telephone Encounter (Signed)
 Called patient to follow up after 01/15/22 colonoscopy. Left voicemail with callback number for any questions or concerns.

## 2023-01-21 LAB — SURGICAL PATHOLOGY

## 2023-01-22 ENCOUNTER — Telehealth: Payer: Self-pay | Admitting: Family Medicine

## 2023-01-22 NOTE — Telephone Encounter (Signed)
 Please call pt to give update on status of PT referral. Did not see notes for this one to update pt with.   Copied from CRM [id number]. Topic: Referral - Status >> Jan 22, 2023  9:22 AM Denise Benton wrote: Reason for CRM: Patient called to check the status of her referrals for Ortho and Physical Therapy. Says she has not heard anything and would like an update on what is going on.

## 2023-01-24 ENCOUNTER — Encounter: Payer: Self-pay | Admitting: Gastroenterology

## 2023-01-28 NOTE — Telephone Encounter (Signed)
 I have R/C to Patient with no call back. We have faxed her Referral to Dr. Wynetta Heckle as she requested.

## 2023-03-24 ENCOUNTER — Encounter: Payer: Self-pay | Admitting: Internal Medicine

## 2023-03-24 ENCOUNTER — Ambulatory Visit: Payer: Medicare Other | Attending: Internal Medicine | Admitting: Internal Medicine

## 2023-03-24 VITALS — BP 100/60 | HR 64 | Ht 61.0 in | Wt 126.8 lb

## 2023-03-24 DIAGNOSIS — I10 Essential (primary) hypertension: Secondary | ICD-10-CM | POA: Diagnosis present

## 2023-03-24 DIAGNOSIS — R000 Tachycardia, unspecified: Secondary | ICD-10-CM | POA: Diagnosis not present

## 2023-03-24 DIAGNOSIS — R002 Palpitations: Secondary | ICD-10-CM | POA: Insufficient documentation

## 2023-03-24 NOTE — Patient Instructions (Addendum)
 Medication Instructions:  Your physician recommends that you continue on your current medications as directed. Please refer to the Current Medication list given to you today.   Labwork: None  Testing/Procedures: None  Follow-Up: Your physician recommends that you schedule a follow-up appointment in: As Needed  Any Other Special Instructions Will Be Listed Below (If Applicable).  Thank you for choosing [facility]!     If you need a refill on your cardiac medications before your next appointment, please call your pharmacy.

## 2023-03-24 NOTE — Progress Notes (Signed)
 Cardiology Office Note  Date: 03/24/2023   ID: Denise Benton, DOB [birth year], MRN [id number]  PCP:  Gabriel Earing, FNP  Cardiologist:  Marjo Bicker, MD Electrophysiologist:  None    History of Present Illness: Denise Benton is a 73 y.o. female known to have HLD is here for follow-up visit of palpitations.  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 She has been having palpitations for quite a while, lasting for 10 to 15 minutes and underwent event monitor recent in 03/2022.  The event monitor is remarkable for NSR, 1 run of 7 beat NSVT, 12 runs of brief asymptomatic SVT (longest lasting 6.5 seconds). Less than 1% PAC and less than 1% PVC burden was noted.  I reviewed her medical records from [facility] since 2010.  She underwent event monitor which showed NSVT and no other major arrhythmias. Echocardiogram was within normal limits, normal LVEF and no valve abnormalities. She also underwent stress echo which was normal. Although she did have positive exercise tolerance test in 2012 but negative stress echo years later. She was scheduled to undergo CT calcium scoring which, to her knowledge, might be performed at [facility].  She informed me that she would reach out to them and get a CT calcium scoring report. She has a strong family history of CAD.  Patient is here for follow-up visit.  No interval palpitations.  No interval symptoms of any angina, DOE, dizziness, presyncope, syncope, leg swelling.  Overall doing great.  Currently on chlorthalidone 25 mg once daily for HTN control.  Blood pressure is adequately controlled.  Previously she had to cut back on the dose from 25 mg to 12.5 mg due to low blood pressures (after reaching out to [place]) but the dose was increased later to 25 mg once daily after she noticed her blood pressures going  up.    Past Medical History:  Diagnosis Date   Allergy    seasonal   Arthritis    Asthma    Duodenal ulcer    Gallstones    GERD (gastroesophageal reflux disease)    HLD (hyperlipidemia)    IBS (irritable bowel syndrome)    Kidney stones    Lumbosacral disc disease    L4-5, L5-S1   Osteopenia     Past Surgical History:  Procedure Laterality Date   BREAST CYST ASPIRATION Right    pt has cysts aspiration done for lumps   CARPOMETACARPAL (CMC) FUSION OF THUMB  02/2022   CESAREAN SECTION  1980   x2; again in 1984   LAPAROSCOPIC CHOLECYSTECTOMY  2000   SKIN LESION EXCISION  07/2011   from scalp - keratosis   UPPER GASTROINTESTINAL ENDOSCOPY      Current Outpatient Medications  Medication Sig Dispense Refill   acetaminophen (TYLENOL) 325 MG tablet Take 325 mg by mouth as needed.     chlorthalidone (HYGROTON) 25 MG tablet Take 25 mg by mouth daily.     hydrocortisone (ANUSOL-HC) 25 MG suppository Place 25 mg rectally as needed for hemorrhoids or anal itching.     ibuprofen (ADVIL) 600 MG tablet Take 1 tablet (600 mg total) by mouth every 8 (eight) hours as needed for moderate pain. 30 tablet 1   No current facility-administered medications for this visit.   Allergies:  Covid-19 (mrna) vaccine, Ivp dye [iodinated contrast media], Nystatin, Codeine, and Niaspan [niacin er (antihyperlipidemic)]  Social History: The patient  reports that she quit smoking about 16 years ago. Her smoking use included cigarettes. She started smoking about 55 years ago. She has a 19.5 pack-year smoking history. She has never used smokeless tobacco. She reports that she does not drink alcohol and does not use drugs.   Family History: The patient's family history includes Anxiety disorder in her daughter; Congestive Heart Failure in her father; Dementia in her mother; Diabetes in her father and paternal grandfather; Heart disease in her father and sister; Hyperlipidemia in her mother; Hypertension in  her daughter; Kidney Stones in her sister; Multiple sclerosis in her sister; Skin cancer in her mother; Tremor in her mother and sister.   ROS:  Please see the history of present illness. Otherwise, complete review of systems is positive for none  All other systems are reviewed and negative.   Physical Exam: VS:  BP 100/60   Pulse 64   Ht 5\' 1"  (1.549 m)   Wt 126 lb 12.8 oz (57.5 kg)   SpO2 100%   BMI 23.96 kg/m , BMI Body mass index is 23.96 kg/m.  Wt Readings from Last 3 Encounters:  03/24/23 126 lb 12.8 oz (57.5 kg)  01/16/23 126 lb (57.2 kg)  12/31/22 126 lb (57.2 kg)    General: Patient appears comfortable at rest. HEENT: Conjunctiva and lids normal, oropharynx clear with moist mucosa. Neck: Supple, no elevated JVP or carotid bruits, no thyromegaly. Lungs: Clear to auscultation, nonlabored breathing at rest. Cardiac: Regular rate and rhythm, no S3 or significant systolic murmur, no pericardial rub. Abdomen: Soft, nontender, no hepatomegaly, bowel sounds present, no guarding or rebound. Extremities: No pitting edema, distal pulses 2+. Skin: Warm and dry. Musculoskeletal: No kyphosis. Neuropsychiatric: Alert and oriented x3, affect grossly appropriate.  Recent Labwork: 11/26/2022: ALT 18; AST 24; BUN 22; Creatinine, Ser 1.18; Hemoglobin 13.9; Platelets 449; Potassium 3.5; Sodium 143     Component Value Date/Time   CHOL 208 (H) 09/05/2021 0918   TRIG 110 09/05/2021 0918   HDL 78 09/05/2021 0918   CHOLHDL 2.7 09/05/2021 0918   LDLCALC 111 (H) 09/05/2021 0918    Other Studies Reviewed Today: I personally reviewed the medical records from [facility] which the patient brought with her today.   Event monitor from 03/2022 was remarkable for 1 run of 7 beat NSVT. Otherwise there are 12 brief asymptomatic SVT episodes. Event monitor at [facility] also showed NSVT, 1 episode and brief SVT episodes.   I reviewed the medical records  at [facility].  Patient had positive exercise tolerance test in 2012 but negative stress echo years later.  She was scheduled to undergo CT calcium scoring test which she said she underwent at [facility].   Assessment and Plan: Patient is a 73 year old F known to have HLD is here for follow-up visit.  Palpitations: No interval palpitations.  Event monitor from 03/2022 showed 1 run of NSVT, nonsustained, lasting for 6 beats.  She had 12 runs of nonsustained VT brief SVT.  Her symptoms correlated with NSR (53 to 112 bpm).  NSVT, 1 run: Nonspecific.  No changes to the plan.  HTN, controlled: Continue chlorthalidone 25 mg once daily.  Previously had to cut back on the dose due to low blood pressures but later it was noted to be from dehydration.  She resumed chlorthalidone 25 mg once daily.  Will continue.  Family history of CAD: Healthy.  No symptoms.  Will monitor.  ER  precautions for chest pain provided.   Medication Adjustments/Labs and Tests Ordered: Current medicines are reviewed at length with the patient today.  Concerns regarding medicines are outlined above.   Tests Ordered: Orders Placed This Encounter  Procedures   EKG 12-Lead    Medication Changes: No orders of the defined types were placed in this encounter.   Disposition:  Follow up PRN  Signed Saamiya Jeppsen Verne Spurr, MD, 03/24/2023 9:50 AM    [facility] [address] [place], [place], [place] [address]

## 2023-04-11 ENCOUNTER — Other Ambulatory Visit: Payer: Self-pay | Admitting: Gastroenterology

## 2023-04-11 DIAGNOSIS — R1013 Epigastric pain: Secondary | ICD-10-CM

## 2023-04-11 DIAGNOSIS — K219 Gastro-esophageal reflux disease without esophagitis: Secondary | ICD-10-CM

## 2023-04-23 ENCOUNTER — Other Ambulatory Visit (HOSPITAL_COMMUNITY): Payer: Self-pay | Admitting: Family Medicine

## 2023-04-23 DIAGNOSIS — Z1231 Encounter for screening mammogram for malignant neoplasm of breast: Secondary | ICD-10-CM

## 2023-05-20 ENCOUNTER — Encounter (HOSPITAL_COMMUNITY): Payer: Self-pay

## 2023-05-22 ENCOUNTER — Ambulatory Visit (HOSPITAL_COMMUNITY)

## 2023-05-27 ENCOUNTER — Encounter (HOSPITAL_COMMUNITY): Payer: Self-pay

## 2023-05-27 ENCOUNTER — Ambulatory Visit (HOSPITAL_COMMUNITY)
Admission: RE | Admit: 2023-05-27 | Discharge: 2023-05-27 | Disposition: A | Discharge status: Home / Self Care | Source: Ambulatory Visit | Attending: Family Medicine | Admitting: Family Medicine

## 2023-05-27 DIAGNOSIS — Z1231 Encounter for screening mammogram for malignant neoplasm of breast: Secondary | ICD-10-CM

## 2023-05-29 ENCOUNTER — Ambulatory Visit (HOSPITAL_COMMUNITY)

## 2023-06-01 ENCOUNTER — Other Ambulatory Visit (HOSPITAL_COMMUNITY): Payer: Self-pay | Admitting: Family Medicine

## 2023-06-01 DIAGNOSIS — R928 Other abnormal and inconclusive findings on diagnostic imaging of breast: Secondary | ICD-10-CM

## 2023-06-18 ENCOUNTER — Ambulatory Visit (HOSPITAL_COMMUNITY)
Admission: RE | Admit: 2023-06-18 | Discharge: 2023-06-18 | Disposition: A | Discharge status: Home / Self Care | Source: Ambulatory Visit | Attending: Family Medicine | Admitting: Family Medicine

## 2023-06-18 DIAGNOSIS — R928 Other abnormal and inconclusive findings on diagnostic imaging of breast: Secondary | ICD-10-CM | POA: Diagnosis present

## 2023-07-10 ENCOUNTER — Encounter: Payer: Self-pay | Admitting: Family Medicine

## 2023-07-10 ENCOUNTER — Ambulatory Visit: Admitting: Family Medicine

## 2023-07-10 VITALS — BP 98/64 | HR 78 | Temp 98.1°F | Ht 61.0 in | Wt 123.6 lb

## 2023-07-10 DIAGNOSIS — I10 Essential (primary) hypertension: Secondary | ICD-10-CM

## 2023-07-10 DIAGNOSIS — I4729 Other ventricular tachycardia: Secondary | ICD-10-CM

## 2023-07-10 DIAGNOSIS — G479 Sleep disorder, unspecified: Secondary | ICD-10-CM | POA: Diagnosis not present

## 2023-07-10 DIAGNOSIS — E782 Mixed hyperlipidemia: Secondary | ICD-10-CM | POA: Diagnosis not present

## 2023-07-10 DIAGNOSIS — M2550 Pain in unspecified joint: Secondary | ICD-10-CM

## 2023-07-10 DIAGNOSIS — R5383 Other fatigue: Secondary | ICD-10-CM | POA: Diagnosis not present

## 2023-07-10 DIAGNOSIS — Z0000 Encounter for general adult medical examination without abnormal findings: Secondary | ICD-10-CM

## 2023-07-10 LAB — BAYER DCA HB A1C WAIVED: HB A1C (BAYER DCA - WAIVED): 5.7 % — ABNORMAL HIGH (ref 4.8–5.6)

## 2023-07-10 NOTE — Progress Notes (Signed)
 Complete physical exam  Patient: Denise Benton   DOB: 03-08-1950   72 y.o. Female  MRN: [id number]  Subjective:    Chief Complaint  Patient presents with   Annual Exam    Denise Benton is a 73 y.o. female who presents today for a complete physical exam. She reports consuming a general diet. Home exercise routine includes walking, yard work. She generally feels fairly well. She reports sleeping poorly. She does have additional problems to discuss today.   She reprots fatigue for the last few weeks. She has been eating well, staying well hydrated. She stays active. She doesn't sleep well. Sleeps for a few hours then wakes up multiple times. She doesn't wish to take medications to help with sleep. Sleep difficulty started about 1 year ago.   She been having a lot of joint pain lately. Pain in back, neck, shoulder, knees. Seeing a [facility] Chiropractor mentioned possibility of autoimmune illness.   Most recent fall risk assessment:    07/10/2023    2:17 PM  Fall Risk   Falls in the past year? 0     Most recent depression screenings:    07/10/2023    2:17 PM 12/31/2022   11:08 AM  PHQ 2/9 Scores  PHQ - 2 Score 1 0  PHQ- 9 Score 7 7        Patient Care Team: Joesph Annabella HERO, FNP as PCP - General (Family Medicine) Mallipeddi, Diannah SQUIBB, MD as PCP - Cardiology (Cardiology) Cara Elida HERO, NP as Nurse Practitioner (Gastroenterology) Hermina Rush, MD (Chiropractic Medicine) Darlean Ozell NOVAK, MD as Consulting Physician (Pulmonary Disease) Ethyl Lonni BRAVO, MD (Inactive) as Consulting Physician (Otolaryngology) Buck Saucer, MD as Attending Physician (Neurology) Matilda Senior, MD as Consulting Physician (Urology)   Outpatient Medications Prior to Visit  Medication Sig   acetaminophen  (TYLENOL ) 325 MG tablet Take 325 mg by mouth as needed.   chlorthalidone  (HYGROTON ) 25 MG tablet Take 25 mg by mouth daily.   hydrocortisone  (ANUSOL -HC) 25 MG  suppository Place 25 mg rectally as needed for hemorrhoids or anal itching.   ibuprofen  (ADVIL ) 600 MG tablet Take 1 tablet (600 mg total) by mouth every 8 (eight) hours as needed for moderate pain.   No facility-administered medications prior to visit.    ROS As per HPI.        Objective:     BP 98/64   Pulse 78   Temp 98.1 F (36.7 C) (Temporal)   Ht 5' 1 (1.549 m)   Wt 123 lb 9.6 oz (56.1 kg)   SpO2 96%   BMI 23.35 kg/m  BP Readings from Last 3 Encounters:  07/10/23 98/64  03/24/23 100/60  01/16/23 126/70      Physical Exam Vitals and nursing note reviewed.  Constitutional:      General: She is not in acute distress.    Appearance: Normal appearance. She is not ill-appearing, toxic-appearing or diaphoretic.  HENT:     Head: Normocephalic.     Right Ear: Tympanic membrane, ear canal and external ear normal.     Left Ear: Tympanic membrane, ear canal and external ear normal.     Nose: Nose normal.     Mouth/Throat:     Mouth: Mucous membranes are moist.     Pharynx: Oropharynx is clear.   Eyes:     Extraocular Movements: Extraocular movements intact.     Conjunctiva/sclera: Conjunctivae normal.     Pupils: Pupils are equal, round, and reactive to  light.   Neck:     Thyroid : No thyroid  mass, thyromegaly or thyroid  tenderness.   Cardiovascular:     Rate and Rhythm: Normal rate and regular rhythm.     Pulses: Normal pulses.     Heart sounds: Normal heart sounds. No murmur heard.    No friction rub. No gallop.  Pulmonary:     Effort: Pulmonary effort is normal.     Breath sounds: Normal breath sounds.  Abdominal:     General: Bowel sounds are normal. There is no distension.     Palpations: Abdomen is soft. There is no mass.     Tenderness: There is no abdominal tenderness. There is no guarding.   Musculoskeletal:     Cervical back: Neck supple.     Right lower leg: No edema.     Left lower leg: No edema.   Skin:    General: Skin is warm and dry.      Capillary Refill: Capillary refill takes less than 2 seconds.     Findings: No lesion or rash.   Neurological:     General: No focal deficit present.     Mental Status: She is alert and oriented to person, place, and time.     Cranial Nerves: No cranial nerve deficit.     Motor: No weakness.     Coordination: Coordination normal.     Gait: Gait normal.   Psychiatric:        Mood and Affect: Mood normal.        Behavior: Behavior normal.        Thought Content: Thought content normal.        Judgment: Judgment normal.      No results found for any visits on 07/10/23.     Assessment & Plan:    Routine Health Maintenance and Physical Exam  Denise Benton was seen today for annual exam.  Diagnoses and all orders for this visit:  Routine general medical examination at a health care facility  Fatigue, unspecified type Discussed likely due to poor sleep. Discussed sleep hygiene, meditation.  -     CBC with Differential/Platelet -     CMP14+EGFR -     TSH  Difficulty sleeping  Primary hypertension BP well controlled.  -     CBC with Differential/Platelet -     CMP14+EGFR -     TSH -     Bayer DCA Hb A1c Waived  Mixed hyperlipidemia Fasting labs pending.  -     Lipid panel  NSVT (nonsustained ventricular tachycardia) (HCC) RRR today.   Arthralgia of multiple sites Suspect OA but will check labs as below.  -     ANA w/Reflex if Positive -     Rheumatoid factor    Immunization History  Administered Date(s) Administered   Fluad Quad(high Dose 65+) 11/08/2019, 12/25/2020, 02/03/2022   Hep B, Unspecified 10/13/1986, 11/21/1986, 06/01/1987, 09/13/1987   Hepatitis A 11/09/2007   Hepatitis A, Adult 11/09/2007, 01/17/2021   IPV 11/09/2007   Influenza, High Dose Seasonal PF 11/08/2019   Influenza,inj,quad, With Preservative 11/14/2018   Influenza-Unspecified 11/08/2019   PFIZER(Purple Top)SARS-COV-2 Vaccination 09/23/2019   Plague 10/13/1969   Pneumococcal  Conjugate-13 11/18/2016   Pneumococcal Polysaccharide-23 10/25/2018   Smallpox 08/15/1974   Td 01/13/2005   Tdap 10/24/2008, 12/25/2020   Typhoid Live 11/09/2007   Yellow Fever 03/13/1969, 10/15/2022    Health Maintenance  Topic Date Due   Medicare Annual Wellness (AWV)  06/16/2023   Zoster Vaccines-  Shingrix (1 of 2) 07/30/2023 (Originally 07/16/1969)   COVID-19 Vaccine (2 - Pfizer risk series) 07/25/2024 (Originally 10/14/2019)   INFLUENZA VACCINE  08/14/2023   MAMMOGRAM  05/26/2024   Fecal DNA (Cologuard)  09/11/2024   DEXA SCAN  04/29/2025   DTaP/Tdap/Td (4 - Td or Tdap) 12/26/2030   Colonoscopy  01/15/2033   Pneumococcal Vaccine: 50+ Years  Completed   Hepatitis C Screening  Completed   Hepatitis B Vaccines  Aged Out   HPV VACCINES  Aged Out   Meningococcal B Vaccine  Aged Out    Discussed health benefits of physical activity, and encouraged her to engage in regular exercise appropriate for her age and condition.  Problem List Items Addressed This Visit       Cardiovascular and Mediastinum   NSVT (nonsustained ventricular tachycardia) (HCC) (Chronic)   HTN (hypertension) (Chronic)   Relevant Orders   CBC with Differential/Platelet   CMP14+EGFR   TSH   Bayer DCA Hb A1c Waived (Completed)     Digestive   GERD (gastroesophageal reflux disease)     Other   Mixed hyperlipidemia   Relevant Orders   Lipid panel   Other Visit Diagnoses       Routine general medical examination at a health care facility    -  Primary     Fatigue, unspecified type       Relevant Orders   CBC with Differential/Platelet   CMP14+EGFR     Arthralgia of multiple sites       Relevant Orders   ANA w/Reflex if Positive   Rheumatoid factor      Return in 1 year (on 07/09/2024) for CPE.   The patient indicates understanding of these issues and agrees with the plan.  Annabella CHRISTELLA Search, FNP

## 2023-07-10 NOTE — Patient Instructions (Signed)

## 2023-07-11 LAB — CBC WITH DIFFERENTIAL/PLATELET
Basophils Absolute: 0.1 10*3/uL (ref 0.0–0.2)
Basos: 1 %
EOS (ABSOLUTE): 0 10*3/uL (ref 0.0–0.4)
Eos: 1 %
Hematocrit: 46.2 % (ref 34.0–46.6)
Hemoglobin: 14.9 g/dL (ref 11.1–15.9)
Immature Grans (Abs): 0 10*3/uL (ref 0.0–0.1)
Immature Granulocytes: 0 %
Lymphocytes Absolute: 2.1 10*3/uL (ref 0.7–3.1)
Lymphs: 24 %
MCH: 30.9 pg (ref 26.6–33.0)
MCHC: 32.3 g/dL (ref 31.5–35.7)
MCV: 96 fL (ref 79–97)
Monocytes Absolute: 0.8 10*3/uL (ref 0.1–0.9)
Monocytes: 9 %
Neutrophils Absolute: 5.8 10*3/uL (ref 1.4–7.0)
Neutrophils: 65 %
Platelets: 384 10*3/uL (ref 150–450)
RBC: 4.82 x10E6/uL (ref 3.77–5.28)
RDW: 12.4 % (ref 11.7–15.4)
WBC: 8.8 10*3/uL (ref 3.4–10.8)

## 2023-07-11 LAB — CMP14+EGFR
ALT: 17 IU/L (ref 0–32)
AST: 25 IU/L (ref 0–40)
Albumin: 4.5 g/dL (ref 3.8–4.8)
Alkaline Phosphatase: 87 IU/L (ref 44–121)
BUN/Creatinine Ratio: 19 (ref 12–28)
BUN: 24 mg/dL (ref 8–27)
Bilirubin Total: 0.8 mg/dL (ref 0.0–1.2)
CO2: 26 mmol/L (ref 20–29)
Calcium: 9.8 mg/dL (ref 8.7–10.3)
Chloride: 98 mmol/L (ref 96–106)
Creatinine, Ser: 1.28 mg/dL — ABNORMAL HIGH (ref 0.57–1.00)
Globulin, Total: 2.4 g/dL (ref 1.5–4.5)
Glucose: 85 mg/dL (ref 70–99)
Potassium: 3.5 mmol/L (ref 3.5–5.2)
Sodium: 143 mmol/L (ref 134–144)
Total Protein: 6.9 g/dL (ref 6.0–8.5)
eGFR: 45 mL/min/{1.73_m2} — ABNORMAL LOW (ref >59)

## 2023-07-11 LAB — ANA W/REFLEX IF POSITIVE: Anti Nuclear Antibody (ANA): NEGATIVE

## 2023-07-11 LAB — LIPID PANEL
Chol/HDL Ratio: 3 ratio (ref 0.0–4.4)
Cholesterol, Total: 225 mg/dL — ABNORMAL HIGH (ref 100–199)
HDL: 74 mg/dL (ref >39)
LDL Chol Calc (NIH): 130 mg/dL — ABNORMAL HIGH (ref 0–99)
Triglycerides: 119 mg/dL (ref 0–149)
VLDL Cholesterol Cal: 21 mg/dL (ref 5–40)

## 2023-07-11 LAB — TSH: TSH: 4 u[IU]/mL (ref 0.450–4.500)

## 2023-07-11 LAB — RHEUMATOID FACTOR: Rheumatoid fact SerPl-aCnc: <10 [IU]/mL (ref <14.0)

## 2023-07-13 ENCOUNTER — Ambulatory Visit: Payer: Self-pay | Admitting: Family Medicine

## 2023-09-01 ENCOUNTER — Telehealth: Payer: Self-pay | Admitting: Family Medicine

## 2023-09-01 NOTE — Telephone Encounter (Signed)
 Copied from CRM [id number]. Topic: General - Other >> Sep 01, 2023  3:14 PM [place] B wrote: Reason for CRM: patient called is going out of the country to [place], and wants to know what shots should she get

## 2023-09-01 NOTE — Telephone Encounter (Signed)
 Patient scheduled with Tiffany on 08/25 to get labs and vaccines if applicable

## 2023-09-09 ENCOUNTER — Encounter: Payer: Self-pay | Admitting: Family Medicine

## 2023-09-09 ENCOUNTER — Ambulatory Visit (INDEPENDENT_AMBULATORY_CARE_PROVIDER_SITE_OTHER): Admitting: Family Medicine

## 2023-09-09 VITALS — BP 88/56 | HR 78 | Temp 97.8°F | Ht 61.0 in | Wt 123.2 lb

## 2023-09-09 DIAGNOSIS — R7982 Elevated C-reactive protein (CRP): Secondary | ICD-10-CM | POA: Diagnosis not present

## 2023-09-09 DIAGNOSIS — Z9189 Other specified personal risk factors, not elsewhere classified: Secondary | ICD-10-CM

## 2023-09-09 DIAGNOSIS — A09 Infectious gastroenteritis and colitis, unspecified: Secondary | ICD-10-CM | POA: Diagnosis not present

## 2023-09-09 DIAGNOSIS — J4520 Mild intermittent asthma, uncomplicated: Secondary | ICD-10-CM

## 2023-09-09 DIAGNOSIS — Z2989 Encounter for other specified prophylactic measures: Secondary | ICD-10-CM

## 2023-09-09 DIAGNOSIS — I4729 Other ventricular tachycardia: Secondary | ICD-10-CM

## 2023-09-09 MED ORDER — ATOVAQUONE-PROGUANIL HCL 250-100 MG PO TABS: ORAL_TABLET | ORAL | 0 refills | Status: AC

## 2023-09-09 MED ORDER — AZITHROMYCIN 250 MG PO TABS: ORAL_TABLET | ORAL | 0 refills | Status: AC

## 2023-09-09 NOTE — Progress Notes (Signed)
 Acute Office Visit  Subjective:     Patient ID: Denise Benton  LITTIE Benton, female    DOB: Jul 14, 1950, 73 y.o.   MRN: [id number]  Chief Complaint  Patient presents with   Medical Management of Chronic Issues    HPI  History of Present Illness   Denise  Denise Omelia Benton is a 73 year old female who presents for travel preparation and follow-up on recent lab results.  Travel preparation - Preparing for travel to [place] on October 18th, 2025, for nine days - Plans to take Malarone  for malaria prophylaxis, starting one to two days before travel, continuing during her stay, and for seven days after returning - Will have azithromycin  available for potential traveler's diarrhea - Needs a typhoid booster prior to travel - Yellow fever and hepatitis A vaccinations are current - Plans to receive influenza and shingles vaccines at the pharmacy   Laboratory and screening findings - Recent [facility] screening shows elevated C-reactive protein (CRP) level of 5.2 - She was having an asthma flare at the time - Also has arthalgias. Has not had autoimmune work up for this before - Other screening results, including heart rate, carotid artery disease, and aortic aneurysm, are normal  Dietary modification - Attempting to reduce sugar intake - Considering a low-carbohydrate, high-protein diet       ROS As per HPI.      Objective:    BP (!) 88/56   Pulse 78   Temp 97.8 F (36.6 C) (Temporal)   Ht 5' 1 (1.549 m)   Wt 123 lb 3.2 oz (55.9 kg)   SpO2 99%   BMI 23.28 kg/m    Physical Exam Vitals and nursing note reviewed.  Constitutional:      General: She is not in acute distress.    Appearance: Normal appearance. She is not ill-appearing, toxic-appearing or diaphoretic.  Cardiovascular:     Rate and Rhythm: Normal rate and regular rhythm.     Heart sounds: Normal heart sounds. No murmur heard. Pulmonary:     Effort: Pulmonary effort is normal. No respiratory distress.     Breath sounds:  Normal breath sounds.  Musculoskeletal:     Right lower leg: No edema.     Left lower leg: No edema.  Skin:    General: Skin is warm and dry.  Neurological:     General: No focal deficit present.     Mental Status: She is alert and oriented to person, place, and time.  Psychiatric:        Mood and Affect: Mood normal.        Behavior: Behavior normal.     No results found for any visits on 09/09/23.      Assessment & Plan:   Ines  Amos was seen today for medical management of chronic issues.  Diagnoses and all orders for this visit:  History of travel with high risk of exposure to communicable disease  Need for malaria prophylaxis -     atovaquone -proguanil (MALARONE ) 250-100 MG TABS tablet; Take 1 tablet once daily. Start 1 to 2 days prior to entering a malaria-endemic area, continue throughout the stay and for 7 days after returning, Normal  Traveler's diarrhea -     azithromycin  (ZITHROMAX  Z-PAK) 250 MG tablet; Take 2 tablets once daily for 3 days for traveler's diarrhea  Elevated C-reactive protein (CRP)  NSVT (nonsustained ventricular tachycardia) (HCC) RRR today on exam.   Mild intermittent asthma without complication  Assessment and Plan    Travel  to [place] - malaria prophylaxis and typhoid booster counseling Travel to [place] necessitates malaria prophylaxis and typhoid booster due to high risk and previous booster over two years ago. - Prescribed Malarone , 30 tablets, start 1-2 days before travel, continue during stay, and for 7 days after return. - Prescribed azithromycin  for potential traveler's diarrhea. - Recommended typhoid booster before travel.  Asthma Asthma exacerbation two months ago may have elevated CRP levels. Inhalers used during this period. Flare now resolved.   Elevated C-reactive protein (CRP) CRP elevated at 5.2. Possible causes include recent asthma exacerbation or ? inflammatory arthritis. Other cardiovascular markers were normal  on screening. - Declined labs today - Recheck CRP during upcoming physical examination. - Consider additional labs for inflammatory arthritis during physical examination.      Keep scheduled CPE.   The patient indicates understanding of these issues and agrees with the plan.  Denise Benton Search, FNP

## 2023-09-18 ENCOUNTER — Telehealth: Payer: Self-pay

## 2023-09-18 ENCOUNTER — Ambulatory Visit

## 2023-09-18 NOTE — Telephone Encounter (Signed)
 Left message advising patient that with her insurance, she may have to pay out of pocket for her shingles vaccine if she gets it in office. After speaking to the billing department they advised that it would be cheaper for her to go to the pharmacy to get her shingles vaccine. Asked patient to return call.

## 2023-10-06 ENCOUNTER — Other Ambulatory Visit: Payer: Self-pay | Admitting: Gastroenterology

## 2023-10-06 ENCOUNTER — Ambulatory Visit (INDEPENDENT_AMBULATORY_CARE_PROVIDER_SITE_OTHER)

## 2023-10-06 ENCOUNTER — Other Ambulatory Visit: Payer: Self-pay | Admitting: Family Medicine

## 2023-10-06 VITALS — BP 103/65 | HR 85 | Ht 61.0 in | Wt 123.0 lb

## 2023-10-06 DIAGNOSIS — Z0000 Encounter for general adult medical examination without abnormal findings: Secondary | ICD-10-CM | POA: Diagnosis not present

## 2023-10-06 DIAGNOSIS — R1013 Epigastric pain: Secondary | ICD-10-CM

## 2023-10-06 DIAGNOSIS — K219 Gastro-esophageal reflux disease without esophagitis: Secondary | ICD-10-CM

## 2023-10-06 DIAGNOSIS — Z2989 Encounter for other specified prophylactic measures: Secondary | ICD-10-CM

## 2023-10-06 NOTE — Progress Notes (Signed)
 Subjective:   Denise Benton is a 73 y.o. who presents for a Medicare Wellness preventive visit.  As a reminder, Annual Wellness Visits don't include a physical exam, and some assessments may be limited, especially if this visit is performed virtually. We may recommend an in-person follow-up visit with your provider if needed.  Visit Complete: Virtual I connected with  Denise Benton on 10/06/23 by a audio enabled telemedicine application and verified that I am speaking with the correct person using two identifiers.  Patient Location: Home  Provider Location: Home Office  I discussed the limitations of evaluation and management by telemedicine. The patient expressed understanding and agreed to proceed.  Vital Signs: Because this visit was a virtual/telehealth visit, some criteria may be missing or patient reported. Any vitals not documented were not able to be obtained and vitals that have been documented are patient reported.  VideoDeclined- This patient declined Librarian, academic. Therefore the visit was completed with audio only.  Persons Participating in Visit: Patient.  AWV Questionnaire: No: Patient Medicare AWV questionnaire was not completed prior to this visit.  Cardiac Risk Factors include: advanced age (>71men, >61 women);hypertension     Objective:    Today's Vitals   10/06/23 0919  BP: 103/65  Pulse: 85  Weight: 123 lb (55.8 kg)  Height: 5' 1 (1.549 m)   Body mass index is 23.24 kg/m.     10/06/2023    9:23 AM 06/16/2022    9:55 AM 01/25/2022    8:01 PM 06/12/2021    9:54 AM 01/12/2021    7:24 PM 06/08/2020    9:07 AM 04/05/2019    9:39 AM  Advanced Directives  Does Patient Have a Medical Advance Directive? Yes Yes No Yes No Yes Yes  Type of Estate agent of [place];Living will Healthcare Power of [place];Living will  Healthcare Power of [place];Living will  Healthcare Power of [place];Living will  Healthcare Power of [place];Living will  Does patient want to make changes to medical advance directive?       No - Patient declined  Copy of Healthcare Power of Attorney in Chart? No - copy requested No - copy requested  No - copy requested  No - copy requested No - copy requested  Would patient like information on creating a medical advance directive?   No - Patient declined  Yes (ED - Information included in AVS)      Current Medications (verified) Outpatient Encounter Medications as of 10/06/2023  Medication Sig   acetaminophen  (TYLENOL ) 325 MG tablet Take 325 mg by mouth as needed.   atovaquone -proguanil (MALARONE ) 250-100 MG TABS tablet Take 1 tablet once daily. Start 1 to 2 days prior to entering a malaria-endemic area, continue throughout the stay and for 7 days after returning, Normal   chlorthalidone  (HYGROTON ) 25 MG tablet Take 25 mg by mouth daily.   ibuprofen  (ADVIL ) 600 MG tablet Take 1 tablet (600 mg total) by mouth every 8 (eight) hours as needed for moderate pain.   azithromycin  (ZITHROMAX  Z-PAK) 250 MG tablet Take 2 tablets once daily for 3 days for traveler's diarrhea (Patient not taking: Reported on 10/06/2023)   No facility-administered encounter medications on file as of 10/06/2023.    Allergies (verified) Covid-19 (mrna) vaccine, Ivp dye [iodinated contrast media], Nystatin, Codeine, and Niaspan [niacin er (antihyperlipidemic)]   History: Past Medical History:  Diagnosis Date   Allergy    seasonal   Arthritis    Asthma  Duodenal ulcer    Gallstones    GERD (gastroesophageal reflux disease)    HLD (hyperlipidemia)    Hypertension    IBS (irritable bowel syndrome)    Kidney stones    Lumbosacral disc disease    L4-5, L5-S1   Osteopenia    Past Surgical History:  Procedure Laterality Date   BREAST CYST ASPIRATION Right    pt has cysts aspiration done for lumps   CARPOMETACARPAL (CMC) FUSION OF THUMB  02/2022   CESAREAN SECTION  1980   x2; again in 1984    LAPAROSCOPIC CHOLECYSTECTOMY  2000   SKIN LESION EXCISION  07/2011   from scalp - keratosis   TUBAL LIGATION     UPPER GASTROINTESTINAL ENDOSCOPY     Family History  Problem Relation Age of Onset   Tremor Mother    Hyperlipidemia Mother    Skin cancer Mother    Dementia Mother    Congestive Heart Failure Father    Diabetes Father    Heart disease Father        congestive heart failure   Multiple sclerosis Sister    Heart disease Sister    Kidney Stones Sister    Tremor Sister    Diabetes Paternal Grandfather    Hypertension Daughter    Anxiety disorder Daughter    ADD / ADHD Son    Colon cancer Neg Hx    Esophageal cancer Neg Hx    Stomach cancer Neg Hx    Rectal cancer Neg Hx    Inflammatory bowel disease Neg Hx    Liver disease Neg Hx    Pancreatic cancer Neg Hx    Sleep apnea Neg Hx    Social History   Socioeconomic History   Marital status: Married    Spouse name: Lynwood    Number of children: 2   Years of education: 4 years college   Highest education level: Bachelor's degree (e.g., BA, AB, BS)  Occupational History   Occupation: Retired RN/NP   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  Tobacco Use   Smoking status: Former    Current packs/day: 0.00    Average packs/day: 0.5 packs/day for 39.0 years (19.5 ttl pk-yrs)    Types: Cigarettes    Start date: 01/14/1968    Quit date: 2009    Years since quitting: 16.7   Smokeless tobacco: Never  Vaping Use   Vaping status: Never Used  Substance and Sexual Activity   Alcohol use: No   Drug use: No   Sexual activity: Not Currently  Other Topics Concern   Not on file  Social History Narrative   Lives home [place] husband - [place]  in 2020   Social Drivers of Health   Financial Resource Strain: Low Risk  (10/06/2023)   Overall Financial Resource Strain (CARDIA)    Difficulty of Paying Living Expenses: Not hard at all  Food  Insecurity: No Food Insecurity (10/06/2023)   Hunger Vital Sign    Worried About Running Out of Food in the Last Year: Never true    Ran Out of Food in the Last Year: Never true  Transportation Needs: No Transportation Needs (10/06/2023)   PRAPARE - Administrator, Civil Service (Medical): No    Lack of Transportation (Non-Medical): No  Physical Activity: Insufficiently Active (10/06/2023)   Exercise Vital Sign    Days of Exercise per Week: 3 days  Minutes of Exercise per Session: 20 min  Stress: Stress Concern Present (10/06/2023)   [employer] of Occupational Health - Occupational Stress Questionnaire    Feeling of Stress: To some extent  Social Connections: Socially Integrated (10/06/2023)   Social Connection and Isolation Panel    Frequency of Communication with Friends and Family: More than three times a week    Frequency of Social Gatherings with Friends and Family: More than three times a week    Attends Religious Services: More than 4 times per year    Active Member of [employer] or Organizations: Yes    Attends Engineer, structural: More than 4 times per year    Marital Status: Married    Tobacco Counseling Counseling given: Yes    Clinical Intake:  Pre-visit preparation completed: Yes  Pain : No/denies pain     BMI - recorded: 23.24 Nutritional Status: BMI of 19-24  Normal Nutritional Risks: None Diabetes: No  Lab Results  Component Value Date   HGBA1C 5.7 (H) 07/10/2023     How often do you need to have someone help you when you read instructions, pamphlets, or other written materials from your doctor or pharmacy?: 1 - Never  Interpreter Needed?: No  Information entered by :: alia t/cma   Activities of Daily Living     10/06/2023    9:22 AM  In your present state of health, do you have any difficulty performing the following activities:  Hearing? 0  Vision? 0  Difficulty concentrating or making decisions? 0  Walking or  climbing stairs? 0  Dressing or bathing? 0  Doing errands, shopping? 0  Preparing Food and eating ? N  Using the Toilet? N  In the past six months, have you accidently leaked urine? N  Do you have problems with loss of bowel control? N  Managing your Medications? N  Managing your Finances? N  Housekeeping or managing your Housekeeping? N    Patient Care Team: Joesph Annabella HERO, FNP as PCP - General (Family Medicine) Mallipeddi, Diannah SQUIBB, MD as PCP - Cardiology (Cardiology) Cara Elida HERO, NP as Nurse Practitioner (Gastroenterology) Hermina Rush, MD (Chiropractic Medicine) Darlean Ozell NOVAK, MD as Consulting Physician (Pulmonary Disease) Ethyl Lonni BRAVO, MD (Inactive) as Consulting Physician (Otolaryngology) Buck Saucer, MD as Attending Physician (Neurology) Matilda Senior, MD as Consulting Physician (Urology)  I have updated your Care Teams any recent Medical Services you may have received from other providers in the past year.     Assessment:   This is a routine wellness examination for Denise Benton .  Hearing/Vision screen Hearing Screening - Comments:: Pt denies hearing dif Vision Screening - Comments:: Pt wear glasses/pt goes to [place]/last ov 1 mos ago   Goals Addressed             This Visit's Progress    DIET - INCREASE WATER INTAKE   On track      Depression Screen     10/06/2023    9:25 AM 09/09/2023   11:33 AM 07/10/2023    2:17 PM 12/31/2022   11:08 AM 11/26/2022    1:14 PM 09/11/2022    9:12 AM 06/16/2022    9:54 AM  PHQ 2/9 Scores  PHQ - 2 Score 0 0 1 0 0 0 0  PHQ- 9 Score 0 3 7 7 3 2  0    Fall Risk     10/06/2023    9:18 AM 09/09/2023   11:32 AM 07/10/2023    2:17  PM 11/26/2022    1:13 PM 09/11/2022    9:12 AM  Fall Risk   Falls in the past year? 1 1 0 1 0  Number falls in past yr:  0  0   Injury with Fall?  0  1   Risk for fall due to : No Fall Risks History of fall(s)  History of fall(s)   Follow up Falls evaluation  completed;Education provided Falls evaluation completed  Falls evaluation completed     MEDICARE RISK AT HOME:  Medicare Risk at Home Any stairs in or around the home?: Yes If so, are there any without handrails?: Yes Home free of loose throw rugs in walkways, pet beds, electrical cords, etc?: Yes Adequate lighting in your home to reduce risk of falls?: Yes Life alert?: No Use of a cane, walker or w/c?: No Grab bars in the bathroom?: Yes Shower chair or bench in shower?: Yes Elevated toilet seat or a handicapped toilet?: Yes  TIMED UP AND GO:  Was the test performed?  no  Cognitive Function: 6CIT completed    12/04/2020    8:46 AM  MMSE - Mini Mental State Exam  Orientation to time 4  Orientation to Place 5  Registration 3  Attention/ Calculation 5  Recall 2  Language- name 2 objects 2  Language- repeat 1  Language- follow 3 step command 3  Language- read & follow direction 1  Write a sentence 1  Copy design 1  Total score 28        10/06/2023    9:24 AM 06/16/2022    9:55 AM 06/12/2021   10:02 AM 06/08/2020    8:50 AM 04/05/2019    9:45 AM  6CIT Screen  What Year? 0 points 0 points 0 points 0 points 0 points  What month? 0 points 0 points 0 points 0 points 0 points  What time? 0 points 0 points 0 points 0 points 0 points  Count back from 20 0 points 0 points 0 points 0 points 0 points  Months in reverse 0 points 0 points 0 points 0 points 0 points  Repeat phrase 0 points 0 points 0 points 2 points 0 points  Total Score 0 points 0 points 0 points 2 points 0 points    Immunizations Immunization History  Administered Date(s) Administered   Fluad Quad(high Dose 65+) 11/08/2019, 12/25/2020, 02/03/2022   Hep B, Unspecified 10/13/1986, 11/21/1986, 06/01/1987, 09/13/1987   Hepatitis A 11/09/2007   Hepatitis A, Adult 11/09/2007, 01/17/2021   INFLUENZA, HIGH DOSE SEASONAL PF 11/08/2019   IPV 11/09/2007   Influenza,inj,quad, With Preservative 11/14/2018    Influenza-Unspecified 11/08/2019   PFIZER(Purple Top)SARS-COV-2 Vaccination 09/23/2019   Plague 10/13/1969   Pneumococcal Conjugate-13 11/18/2016   Pneumococcal Polysaccharide-23 10/25/2018   Smallpox 08/15/1974   Td 01/13/2005   Tdap 10/24/2008, 12/25/2020   Typhoid Live 11/09/2007   Yellow Fever 03/13/1969, 10/15/2022   Zoster Recombinant(Shingrix) 09/18/2023    Screening Tests Health Maintenance  Topic Date Due   Influenza Vaccine  04/12/2024 (Originally 08/14/2023)   COVID-19 Vaccine (2 - Pfizer risk series) 07/25/2024 (Originally 10/14/2019)   Zoster Vaccines- Shingrix (2 of 2) 11/13/2023   Mammogram  05/26/2024   Fecal DNA (Cologuard)  09/11/2024   Medicare Annual Wellness (AWV)  10/05/2024   DEXA SCAN  04/29/2025   DTaP/Tdap/Td (4 - Td or Tdap) 12/26/2030   Colonoscopy  01/15/2033   Pneumococcal Vaccine: 50+ Years  Completed   Hepatitis C Screening  Completed   HPV  VACCINES  Aged Out   Meningococcal B Vaccine  Aged Out    Health Maintenance Items Addressed: See Nurse Notes at the end of this note  Additional Screening:  Vision Screening: Recommended annual ophthalmology exams for early detection of glaucoma and other disorders of the eye. Is the patient up to date with their annual eye exam?  Yes  Who is the provider or what is the name of the office in which the patient attends annual eye exams? [facility]  Dental Screening: Recommended annual dental exams for proper oral hygiene  Community Resource Referral / Chronic Care Management: CRR required this visit?  No   CCM required this visit?  No   Plan:    I have personally reviewed and noted the following in the patient's chart:   Medical and social history Use of alcohol, tobacco or illicit drugs  Current medications and supplements including opioid prescriptions. Patient is not currently taking opioid prescriptions. Functional ability and status Nutritional status Physical activity Advanced  directives List of other physicians Hospitalizations, surgeries, and ER visits in previous 12 months Vitals Screenings to include cognitive, depression, and falls Referrals and appointments  In addition, I have reviewed and discussed with patient certain preventive protocols, quality metrics, and best practice recommendations. A written personalized care plan for preventive services as well as general preventive health recommendations were provided to patient.   Denise Benton, CMA   10/06/2023   After Visit Summary: (MyChart) Due to this being a telephonic visit, the after visit summary with patients personalized plan was offered to patient via MyChart   Notes: Nothing significant to report at this time.

## 2023-10-06 NOTE — Telephone Encounter (Signed)
 Patient needs to contact office to confirm that refill is needed.

## 2023-10-06 NOTE — Patient Instructions (Signed)
 Denise Benton,  Thank you for taking the time for your Medicare Wellness Visit. I appreciate your continued commitment to your health goals. Please review the care plan we discussed, and feel free to reach out if I can assist you further.  Medicare recommends these wellness visits once per year to help you and your care team stay ahead of potential health issues. These visits are designed to focus on prevention, allowing your provider to concentrate on managing your acute and chronic conditions during your regular appointments.  Please note that Annual Wellness Visits do not include a physical exam. Some assessments may be limited, especially if the visit was conducted virtually. If needed, we may recommend a separate in-person follow-up with your provider.  Ongoing Care Seeing your primary care provider every 3 to 6 months helps us  monitor your health and provide consistent, personalized care.   Referrals If a referral was made during today's visit and you haven't received any updates within two weeks, please contact the referred provider directly to check on the status.  Recommended Screenings:  Health Maintenance  Topic Date Due   Medicare Annual Wellness Visit  06/16/2023   Flu Shot  04/12/2024*   COVID-19 Vaccine (2 - Pfizer risk series) 07/25/2024*   Zoster (Shingles) Vaccine (2 of 2) 11/13/2023   Breast Cancer Screening  05/26/2024   Cologuard (Stool DNA test)  09/11/2024   DEXA scan (bone density measurement)  04/29/2025   DTaP/Tdap/Td vaccine (4 - Td or Tdap) 12/26/2030   Colon Cancer Screening  01/15/2033   Pneumococcal Vaccine for age over 74  Completed   Hepatitis C Screening  Completed   HPV Vaccine  Aged Out   Meningitis B Vaccine  Aged Out  *Topic was postponed. The date shown is not the original due date.       10/06/2023    9:23 AM  Advanced Directives  Does Patient Have a Medical Advance Directive? Yes  Type of Estate agent of [place];Living  will  Copy of Healthcare Power of Attorney in Chart? No - copy requested   Advance Care Planning is important because it: Ensures you receive medical care that aligns with your values, goals, and preferences. Provides guidance to your family and loved ones, reducing the emotional burden of decision-making during critical moments.  Vision: Annual vision screenings are recommended for early detection of glaucoma, cataracts, and diabetic retinopathy. These exams can also reveal signs of chronic conditions such as diabetes and high blood pressure.  Dental: Annual dental screenings help detect early signs of oral cancer, gum disease, and other conditions linked to overall health, including heart disease and diabetes.  Please see the attached documents for additional preventive care recommendations.

## 2023-10-08 ENCOUNTER — Other Ambulatory Visit: Payer: Self-pay | Admitting: Internal Medicine

## 2023-10-08 ENCOUNTER — Other Ambulatory Visit: Payer: Self-pay | Admitting: Gastroenterology

## 2023-10-08 DIAGNOSIS — R1013 Epigastric pain: Secondary | ICD-10-CM

## 2023-10-08 DIAGNOSIS — K219 Gastro-esophageal reflux disease without esophagitis: Secondary | ICD-10-CM

## 2023-10-15 ENCOUNTER — Encounter: Payer: Self-pay | Admitting: Family Medicine

## 2023-10-15 ENCOUNTER — Ambulatory Visit: Admitting: Family Medicine

## 2023-10-15 VITALS — BP 106/64 | HR 67 | Temp 97.8°F | Ht 61.0 in | Wt 119.0 lb

## 2023-10-15 DIAGNOSIS — E782 Mixed hyperlipidemia: Secondary | ICD-10-CM

## 2023-10-15 DIAGNOSIS — I4729 Other ventricular tachycardia: Secondary | ICD-10-CM

## 2023-10-15 DIAGNOSIS — R7303 Prediabetes: Secondary | ICD-10-CM

## 2023-10-15 DIAGNOSIS — Z23 Encounter for immunization: Secondary | ICD-10-CM | POA: Diagnosis not present

## 2023-10-15 DIAGNOSIS — G8929 Other chronic pain: Secondary | ICD-10-CM

## 2023-10-15 DIAGNOSIS — R7989 Other specified abnormal findings of blood chemistry: Secondary | ICD-10-CM

## 2023-10-15 DIAGNOSIS — F5101 Primary insomnia: Secondary | ICD-10-CM

## 2023-10-15 DIAGNOSIS — M25512 Pain in left shoulder: Secondary | ICD-10-CM

## 2023-10-15 LAB — BAYER DCA HB A1C WAIVED: HB A1C (BAYER DCA - WAIVED): 5.5 % (ref 4.8–5.6)

## 2023-10-15 LAB — LIPID PANEL

## 2023-10-15 NOTE — Progress Notes (Addendum)
 Established Patient Office Visit  Subjective   Patient ID: Denise Benton, female    DOB: February 13, 1950  Age: 73 y.o. MRN: [id number]  Chief Complaint  Patient presents with   Medical Management of Chronic Issues    HPI  History of Present Illness   Denise Benton is a 73 year old female who presents for a follow-up visit to discuss her recent health improvements and ongoing issues.  Glycemic control and dietary management - A1c is 5.5 - Adheres to a low-carbohydrate, keto-like diet designed by a physician from [facility] - Avoids bread and sugar, occasionally consumes sweet potato - Consumes high protein foods including eggs and bacon - Uses Splenda in tea - Reports reduced hunger throughout the day - Has lost four pounds since last visit; previously weighed nearly 130 pounds in November of the prior year  Neurocognitive symptoms and energy levels - Experiences decreased episodes of brain fog since dietary changes - Has more energy overall but continues to experience tiredness - Ongoing issues with poor sleep quality - Hopes sleep improvement will further reduce brain fog and memory issues  Sleep disturbance - Evaluated by sleep specialist at the [facility] - Sleep apnea considered unlikely - Provided with strategies to improve sleep quality - Shoulder pain sometimes disrupts sleep  Musculoskeletal pain - Shoulder pain present, occasionally severe enough to interfere with sleep - established with ortho. Putting off surgery as long as possible  Physical activity and functional status - Actively working, which provides regular exercise - Walks about a mile on days off  Cardiopulmonary symptoms - No chest pain, shortness of breath, palpitations, or peripheral edema  Preventive health maintenance - Recent mammogram and dental check-up, both with normal results        ROS    Objective:     BP 106/64   Pulse 67   Temp 97.8 F (36.6 C) (Temporal)   Ht 5' 1 (1.549  m)   Wt 119 lb (54 kg)   SpO2 100%   BMI 22.48 kg/m  Wt Readings from Last 3 Encounters:  10/15/23 119 lb (54 kg)  10/06/23 123 lb (55.8 kg)  09/09/23 123 lb 3.2 oz (55.9 kg)      Physical Exam Vitals and nursing note reviewed.  Constitutional:      General: She is not in acute distress.    Appearance: Normal appearance. She is not ill-appearing, toxic-appearing or diaphoretic.  Cardiovascular:     Rate and Rhythm: Normal rate and regular rhythm.     Heart sounds: Normal heart sounds. No murmur heard. Pulmonary:     Effort: Pulmonary effort is normal. No respiratory distress.     Breath sounds: Normal breath sounds.  Musculoskeletal:     Right lower leg: No edema.     Left lower leg: No edema.  Skin:    General: Skin is warm and dry.  Neurological:     General: No focal deficit present.     Mental Status: She is alert and oriented to person, place, and time.  Psychiatric:        Mood and Affect: Mood normal.        Behavior: Behavior normal.      No results found for any visits on 10/15/23.    The 10-year ASCVD risk score (Arnett DK, et al., 2019) is: 12.1%    Assessment & Plan:   Denise Benton was seen today for medical management of chronic issues.  Diagnoses and all orders for  this visit:  Prediabetes -     Bayer DCA Hb A1c Waived  Mixed hyperlipidemia -     Lipid panel  Elevated serum creatinine -     CMP14+EGFR  NSVT (nonsustained ventricular tachycardia) (HCC)  Primary insomnia  Chronic left shoulder pain  Encounter for immunization -     Flu vaccine HIGH DOSE PF(Fluzone Trivalent)      Chronic left shoulder pain Shoulder pain persists, affecting sleep. She is considering surgery but will wait until the pain becomes more severe. - Continue follow up with ortho prn  Insomnia Reports difficulty with sleep onset and maintenance. A sleep specialist has ruled out sleep apnea and provided home strategies to improve sleep. - Implement  home strategies provided by sleep specialist.  Prediabetes A1c is 5.5, indicating normal glucose levels. She is following a low-carb diet, which has contributed to weight loss and improved energy levels. - Continue current low-carb diet.  Mixed hyperlipidemia Awaiting cholesterol results after diet changes - Review cholesterol results when available.     NSVT - Denies symptoms - Continue follow up with cardiology.    Return in about 9 months (around 07/09/2024) for CPE.   The patient indicates understanding of these issues and agrees with the plan.  Denise CHRISTELLA Search, FNP

## 2023-10-15 NOTE — Addendum Note (Signed)
 Addended by: JOESPH ANNABELLA HERO on: 10/15/2023 09:04 AM   Modules accepted: Level of Service

## 2023-10-16 LAB — LIPID PANEL
Cholesterol, Total: 244 mg/dL — AB (ref 100–199)
HDL: 77 mg/dL (ref >39)
LDL CALC COMMENT:: 3.2 ratio (ref 0.0–4.4)
LDL Chol Calc (NIH): 153 mg/dL — AB (ref 0–99)
Triglycerides: 81 mg/dL (ref 0–149)
VLDL Cholesterol Cal: 14 mg/dL (ref 5–40)

## 2023-10-16 LAB — CMP14+EGFR
ALT: 16 IU/L (ref 0–32)
AST: 28 IU/L (ref 0–40)
Albumin: 4.4 g/dL (ref 3.8–4.8)
Alkaline Phosphatase: 95 IU/L (ref 49–135)
BUN/Creatinine Ratio: 17 (ref 12–28)
BUN: 19 mg/dL (ref 8–27)
Bilirubin Total: 0.7 mg/dL (ref 0.0–1.2)
CO2: 27 mmol/L (ref 20–29)
Calcium: 9.8 mg/dL (ref 8.7–10.3)
Chloride: 94 mmol/L — ABNORMAL LOW (ref 96–106)
Creatinine, Ser: 1.11 mg/dL — ABNORMAL HIGH (ref 0.57–1.00)
Globulin, Total: 2.5 g/dL (ref 1.5–4.5)
Glucose: 91 mg/dL (ref 70–99)
Potassium: 3.2 mmol/L — ABNORMAL LOW (ref 3.5–5.2)
Sodium: 140 mmol/L (ref 134–144)
Total Protein: 6.9 g/dL (ref 6.0–8.5)
eGFR: 52 mL/min/1.73 — ABNORMAL LOW (ref >59)

## 2023-10-19 ENCOUNTER — Ambulatory Visit: Payer: Self-pay | Admitting: Family Medicine

## 2023-10-20 ENCOUNTER — Encounter: Payer: Self-pay | Admitting: Internal Medicine

## 2023-10-20 ENCOUNTER — Ambulatory Visit: Attending: Internal Medicine | Admitting: Internal Medicine

## 2023-10-20 VITALS — BP 124/62 | HR 60 | Ht 61.0 in | Wt 121.2 lb

## 2023-10-20 DIAGNOSIS — Z01810 Encounter for preprocedural cardiovascular examination: Secondary | ICD-10-CM | POA: Insufficient documentation

## 2023-10-20 NOTE — Patient Instructions (Signed)
 Medication Instructions:  Your physician recommends that you continue on your current medications as directed. Please refer to the Current Medication list given to you today.  *If you need a refill on your cardiac medications before your next appointment, please call your pharmacy*  Lab Work: None If you have labs (blood work) drawn today and your tests are completely normal, you will receive your results only by: MyChart Message (if you have MyChart) OR A paper copy in the mail If you have any lab test that is abnormal or we need to change your treatment, we will call you to review the results.  Testing/Procedures: None  Follow-Up: At [facility], you and your health needs are our priority.  As part of our continuing mission to provide you with exceptional heart care, our providers are all part of one team.  This team includes your primary Cardiologist (physician) and Advanced Practice Providers or APPs (Physician Assistants and Nurse Practitioners) who all work together to provide you with the care you need, when you need it.  Your next appointment:    Follow up as needed.   Provider:   You may see Vishnu P Mallipeddi, MD or one of the following Advanced Practice Providers on your designated Care Team:   [place], PA-C  Scotesia Leary, [place] Jacolyn Reedy, [place]     We recommend signing up for the patient portal called "MyChart".  Sign up information is provided on this After Visit Summary.  MyChart is used to connect with patients for Virtual Visits (Telemedicine).  Patients are able to view lab/test results, encounter notes, upcoming appointments, etc.  Non-urgent messages can be sent to your provider as well.   To learn more about what you can do with MyChart, go to [email or web address].   Other Instructions

## 2023-10-20 NOTE — Progress Notes (Signed)
 Cardiology Office Note  Date: 10/20/2023   ID: Denise  Tanyika, Benton 21-Aug-1950, MRN [id number]  PCP:  Joesph Annabella HERO, FNP  Cardiologist:  Diannah SHAUNNA Maywood, MD Electrophysiologist:  None    History of Present Illness: Denise Benton is a 73 y.o. female known to have HLD is here for follow-up visit of preop clearance.  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 She has been having palpitations for quite a while, lasting for 10 to 15 minutes and underwent event monitor recent in 03/2022.  The event monitor is remarkable for NSR, 1 run of 7 beat NSVT, 12 runs of brief asymptomatic SVT (longest lasting 6.5 seconds). Less than 1% PAC and less than 1% PVC burden was noted.  I reviewed her medical records from [facility] since 2010.  She underwent event monitor which showed NSVT and no other major arrhythmias. Echocardiogram was within normal limits, normal LVEF and no valve abnormalities. She also underwent stress echo which was normal. Although she did have positive exercise tolerance test in 2012 but negative stress echo years later. She was scheduled to undergo CT calcium scoring which, to her knowledge, might be performed at [facility].  She informed me that she would reach out to them and get a CT calcium scoring report. She has a strong family history of CAD.  Patient is here for follow-up visit.  Patient is lying to [place] on October 31, 2023 on medical mission.  She wants to know if she is cleared to fly.  She does not have any symptoms of angina or DOE.  She is worried that her heart rate is dropping at night.  When she wakes up and checks it, it is usually around 52, 54 bpm.  She does not have a smart watch.  No other symptoms of heart palpitations, leg swelling.    Past Medical History:  Diagnosis Date   Allergy    seasonal   Arthritis     Asthma    Duodenal ulcer    Gallstones    GERD (gastroesophageal reflux disease)    HLD (hyperlipidemia)    Hypertension    IBS (irritable bowel syndrome)    Kidney stones    Lumbosacral disc disease    L4-5, L5-S1   Osteopenia     Past Surgical History:  Procedure Laterality Date   BREAST CYST ASPIRATION Right    pt has cysts aspiration done for lumps   CARPOMETACARPAL (CMC) FUSION OF THUMB  02/2022   CESAREAN SECTION  1980   x2; again in 1984   LAPAROSCOPIC CHOLECYSTECTOMY  2000   SKIN LESION EXCISION  07/2011   from scalp - keratosis   TUBAL LIGATION     UPPER GASTROINTESTINAL ENDOSCOPY      Current Outpatient Medications  Medication Sig Dispense Refill   acetaminophen  (TYLENOL ) 325 MG tablet Take 325 mg by mouth as needed.     atovaquone -proguanil (MALARONE ) 250-100 MG TABS tablet Take 1 tablet once daily. Start 1 to 2 days prior to entering a malaria-endemic area, continue throughout the stay and for 7 days after returning, Normal 30 tablet 0   azithromycin  (ZITHROMAX  Z-PAK) 250 MG tablet Take 2 tablets once daily for 3 days for traveler's diarrhea 6 tablet 0   chlorthalidone  (HYGROTON ) 25 MG tablet TAKE 1 TABLET (25 MG TOTAL) BY MOUTH DAILY. 90 tablet 1   ibuprofen  (ADVIL ) 600  MG tablet Take 1 tablet (600 mg total) by mouth every 8 (eight) hours as needed for moderate pain. 30 tablet 1   RABEprazole  (ACIPHEX ) 20 MG tablet TAKE 1 TABLET BY MOUTH EVERY DAY 60 tablet 2   No current facility-administered medications for this visit.   Allergies:  Covid-19 (mrna) vaccine, Ivp dye [iodinated contrast media], Nystatin, Codeine, and Niaspan [niacin er (antihyperlipidemic)]   Social History: The patient  reports that she quit smoking about 16 years ago. Her smoking use included cigarettes. She started smoking about 55 years ago. She has a 19.5 pack-year smoking history. She has never used smokeless tobacco. She reports that she does not drink alcohol and does not use drugs.    Family History: The patient's family history includes ADD / ADHD in her son; Anxiety disorder in her daughter; Congestive Heart Failure in her father; Dementia in her mother; Diabetes in her father and paternal grandfather; Heart disease in her father and sister; Hyperlipidemia in her mother; Hypertension in her daughter; Kidney Stones in her sister; Multiple sclerosis in her sister; Skin cancer in her mother; Tremor in her mother and sister.   ROS:  Please see the history of present illness. Otherwise, complete review of systems is positive for none  All other systems are reviewed and negative.   Physical Exam: VS:  Ht 5' 1 (1.549 m)   Wt 121 lb 3.2 oz (55 kg)   BMI 22.90 kg/m , BMI Body mass index is 22.9 kg/m.  Wt Readings from Last 3 Encounters:  10/20/23 121 lb 3.2 oz (55 kg)  10/15/23 119 lb (54 kg)  10/06/23 123 lb (55.8 kg)    General: Patient appears comfortable at rest. HEENT: Conjunctiva and lids normal, oropharynx clear with moist mucosa. Neck: Supple, no elevated JVP or carotid bruits, no thyromegaly. Lungs: Clear to auscultation, nonlabored breathing at rest. Cardiac: Regular rate and rhythm, no S3 or significant systolic murmur, no pericardial rub. Abdomen: Soft, nontender, no hepatomegaly, bowel sounds present, no guarding or rebound. Extremities: No pitting edema, distal pulses 2+. Skin: Warm and dry. Musculoskeletal: No kyphosis. Neuropsychiatric: Alert and oriented x3, affect grossly appropriate.  Recent Labwork: 07/10/2023: Hemoglobin 14.9; Platelets 384; TSH 4.000 10/15/2023: ALT 16; AST 28; BUN 19; Creatinine, Ser 1.11; Potassium 3.2; Sodium 140     Component Value Date/Time   CHOL 244 (H) 10/15/2023 0820   TRIG 81 10/15/2023 0820   HDL 77 10/15/2023 0820   CHOLHDL 3.2 10/15/2023 0820   LDLCALC 153 (H) 10/15/2023 0820    Other Studies Reviewed Today: I personally reviewed the medical records from [facility] which the patient  brought with her today.  Event monitor from 03/2022 was remarkable for 1 run of 7 beat NSVT. Otherwise there are 12 brief asymptomatic SVT episodes. Event monitor at [facility] also showed NSVT, 1 episode and brief SVT episodes.   I reviewed the medical records at [facility].  Patient had positive exercise tolerance test in 2012 but negative stress echo years later.  She was scheduled to undergo CT calcium scoring test which she said she underwent at [facility].   Assessment and Plan: Patient is a 73 year old F known to have HLD is here for preop cardiac risk stratification.  Preop cardiac risk stratification: Patient is here for preop clearance.  She wants to know if she is cleared to fly to [place].  She does not have any angina or DOE.  Event monitor was unremarkable except for nonspecific  7 beat run of NSVT.  She is worried about her low heart rates at night but she does not have any symptoms of dizziness, lightheadedness, syncope or severe fatigue in the wakeful hours.  At night, her heart rates around 50 to 54 bpm (this is in the middle of the night when she suddenly wakes up and checks it).  Reassurance provided.  Patient is at a low risk, okay to fly.  Palpitations: No interval palpitations.  Event monitor from 03/2022 showed 1 run of NSVT, nonsustained, lasting for 6 beats.  She had 12 runs of nonsustained VT brief SVT.  Her symptoms correlated with NSR (53 to 112 bpm).  No indication for rate controlling agents at this time.  NSVT, 1 run: Nonspecific.  No changes to the plan.  HTN, controlled: Continue chlorthalidone  25 mg once daily.  Previously had to cut back on the dose due to low blood pressures but later it was noted to be from dehydration.  She resumed chlorthalidone  25 mg once daily.  Will continue.  Family history of CAD: Healthy.  No symptoms.  Will monitor.  ER precautions for chest pain provided.  HLD, not  at goal: LDL 153.  Currently she is on modified keto diet.  She will transition to regular diet in a few months and will want her PCP to check her lipid panel again.  She does not want to be on a statin.  I reviewed her reports today that showed high-sensitivity CRP to be elevated.  But this was in the setting of bronchitis and asthma.  PCP can repeat Hs CRP or omit.  30 minutes spent in remainder prior notes, more than 3 labs, discussion of the above problems with the patient and documentation.   Medication Adjustments/Labs and Tests Ordered: Current medicines are reviewed at length with the patient today.  Concerns regarding medicines are outlined above.   Tests Ordered: No orders of the defined types were placed in this encounter.   Medication Changes: No orders of the defined types were placed in this encounter.   Disposition:  Follow up PRN  Signed Alfonso Shackett Arleta Maywood, MD, 10/20/2023 8:40 AM    [facility] at [facility] [address] [place], [place], [place] [address]

## 2023-10-28 ENCOUNTER — Telehealth: Payer: Self-pay

## 2023-10-28 NOTE — Telephone Encounter (Signed)
 Letter was printed and mailed to pt. Will close encounter.

## 2023-10-28 NOTE — Telephone Encounter (Signed)
 Copied from CRM [id number]. Topic: Clinical - Lab/Test Results >> Oct 28, 2023 12:12 PM Wess RAMAN wrote: Reason for CRM: Patient would like her lab results mailed to her.   Callback #: [phone]

## 2023-11-23 ENCOUNTER — Encounter: Payer: Self-pay | Admitting: Family

## 2023-11-23 ENCOUNTER — Ambulatory Visit: Payer: Self-pay

## 2023-11-23 ENCOUNTER — Ambulatory Visit (INDEPENDENT_AMBULATORY_CARE_PROVIDER_SITE_OTHER): Admitting: Family

## 2023-11-23 VITALS — BP 120/78 | HR 65 | Temp 97.4°F | Ht 61.0 in | Wt 120.2 lb

## 2023-11-23 DIAGNOSIS — H6993 Unspecified Eustachian tube disorder, bilateral: Secondary | ICD-10-CM | POA: Diagnosis not present

## 2023-11-23 MED ORDER — PREDNISONE 20 MG PO TABS: 40.0000 mg | ORAL_TABLET | Freq: Every day | ORAL | 0 refills | Status: AC

## 2023-11-23 NOTE — Patient Instructions (Signed)
 Eustachian Tube Dysfunction  Eustachian tube dysfunction refers to a condition in which a blockage develops in the narrow passage that connects the middle ear to the back of the nose (eustachian tube). The eustachian tube regulates air pressure in the middle ear by letting air move between the ear and nose. It also helps to drain fluid from the middle ear space. Eustachian tube dysfunction can affect one or both ears. When the eustachian tube does not function properly, air pressure, fluid, or both can build up in the middle ear. What are the causes? This condition occurs when the eustachian tube becomes blocked or cannot open normally. Common causes of this condition include: Ear infections. Colds and other infections that affect the nose, mouth, and throat (upper respiratory tract). Allergies. Irritation from cigarette smoke. Irritation from stomach acid coming up into the esophagus (gastroesophageal reflux). The esophagus is the part of the body that moves food from the mouth to the stomach. Sudden changes in air pressure, such as from descending in an airplane or scuba diving. Abnormal growths in the nose or throat, such as: Growths that line the nose (nasal polyps). Abnormal growth of cells (tumors). Enlarged tissue at the back of the throat (adenoids). What increases the risk? You are more likely to develop this condition if: You smoke. You are overweight. You are a child who has: Certain birth defects of the mouth, such as cleft palate. Large tonsils or adenoids. What are the signs or symptoms? Common symptoms of this condition include: A feeling of fullness in the ear. Ear pain. Clicking or popping noises in the ear. Ringing in the ear (tinnitus). Hearing loss. Loss of balance. Dizziness. Symptoms may get worse when the air pressure around you changes, such as when you travel to an area of high elevation, fly on an airplane, or go scuba diving. How is this diagnosed? This  condition may be diagnosed based on: Your symptoms. A physical exam of your ears, nose, and throat. Tests, such as those that measure: The movement of your eardrum. Your hearing (audiometry). How is this treated? Treatment depends on the cause and severity of your condition. In mild cases, you may relieve your symptoms by moving air into your ears. This is called "popping the ears." In more severe cases, or if you have symptoms of fluid in your ears, treatment may include: Medicines to relieve congestion (decongestants). Medicines that treat allergies (antihistamines). Nasal sprays or ear drops that contain medicines that reduce swelling (steroids). A procedure to drain the fluid in your eardrum. In this procedure, a small tube may be placed in the eardrum to: Drain the fluid. Restore the air in the middle ear space. A procedure to insert a balloon device through the nose to inflate the opening of the eustachian tube (balloon dilation). Follow these instructions at home: Lifestyle Do not do any of the following until your health care provider approves: Travel to high altitudes. Fly in airplanes. Work in a Estate agent or room. Scuba dive. Do not use any products that contain nicotine or tobacco. These products include cigarettes, chewing tobacco, and vaping devices, such as e-cigarettes. If you need help quitting, ask your health care provider. Keep your ears dry. Wear fitted earplugs during showering and bathing. Dry your ears completely after. General instructions Take over-the-counter and prescription medicines only as told by your health care provider. Use techniques to help pop your ears as recommended by your health care provider. These may include: Chewing gum. Yawning. Frequent, forceful swallowing.  Closing your mouth, holding your nose closed, and gently blowing as if you are trying to blow air out of your nose. Keep all follow-up visits. This is important. Contact a  health care provider if: Your symptoms do not go away after treatment. Your symptoms come back after treatment. You are unable to pop your ears. You have: A fever. Pain in your ear. Pain in your head or neck. Fluid draining from your ear. Your hearing suddenly changes. You become very dizzy. You lose your balance. Get help right away if: You have a sudden, severe increase in any of your symptoms. Summary Eustachian tube dysfunction refers to a condition in which a blockage develops in the eustachian tube. It can be caused by ear infections, allergies, inhaled irritants, or abnormal growths in the nose or throat. Symptoms may include ear pain or fullness, hearing loss, or ringing in the ears. Mild cases are treated with techniques to unblock the ears, such as yawning or chewing gum. More severe cases are treated with medicines or procedures. This information is not intended to replace advice given to you by your health care provider. Make sure you discuss any questions you have with your health care provider. Document Revised: 03/12/2020 Document Reviewed: 03/12/2020 Elsevier Patient Education  2024 [employer].

## 2023-11-23 NOTE — Telephone Encounter (Signed)
 FYI Only or Action Required?: FYI only for provider: appointment scheduled on 11/23/23.  Patient was last seen in primary care on 10/15/2023 by Joesph Annabella HERO, FNP.  Called Nurse Triage reporting Dizziness and Ear Fullness.  Symptoms began several weeks ago.  Interventions attempted: OTC medications: zyrtec, claritin, nasal spray and Prescription medications: abx finished last week.  Symptoms are: unchanged.  Triage Disposition: See Physician Within 24 Hours  Patient/caregiver understands and will follow disposition?: Yes          Copied from CRM #[id number]. Topic: Clinical - Red Word Triage >> Nov 23, 2023  8:05 AM Alfonso ORN wrote: Red Word that prompted transfer to Nurse Triage: both of middle ears having a lot of pressure and having dizzy spells and painful rate 4 or 5 get worse at night   Went to urgent care a week ago was on antibotic for a week ago still having issue not feeling any better , Reason for Disposition  Earache  Answer Assessment - Initial Assessment Questions 1. DESCRIPTION: Describe your dizziness.     Dizzy spells, middle ear pressure 2. VERTIGO: Do you feel like either you or the room is spinning or tilting?      denies 3. LIGHTHEADED: Do you feel lightheaded? (e.g., somewhat faint, woozy, weak upon standing)     denies 4. SEVERITY: How bad is it?  Can you walk?     Is still able to walk 5. ONSET:  When did the dizziness begin?     A week 6. AGGRAVATING FACTORS: Does anything make it worse? (e.g., standing, change in head position)     *No Answer* 7. CAUSE: What do you think is causing the dizziness?     congestion 8. RECURRENT SYMPTOM: Have you had dizziness before? If Yes, ask: When was the last time? What happened that time?     *No Answer* 9. OTHER SYMPTOMS: Do you have any other symptoms? (e.g., earache, headache, numbness, tinnitus, vomiting, weakness)     Ear pressure ear drums hurt, post nasal drip 10. PREGNANCY:  Is there any chance you are pregnant? When was your last menstrual period?       N/a  Protocols used: Dizziness - Vertigo-A-AH

## 2023-11-23 NOTE — Telephone Encounter (Signed)
 Appt made.

## 2023-11-23 NOTE — Progress Notes (Signed)
 Subjective:    Patient ID: Denise Benton  LITTIE Gaskins, female    DOB: 08/01/1950, 73 y.o.   MRN: [id number]  Chief Complaint  Patient presents with   Ear Pain    Both ears week and 1/2 went to urgent care and they gave antibotic   PT presents to the office today with bilateral ear pain. She was seen in the Urgent Care on 11/13/23 and given Amoxicillin  875 mg BID for 7 days for sinusitis. She has been taking Claritin, flonase, and Ayr Saline Mist without improvement.   Reports her ear pain was slightly improving while taking the Amoxicillin , but has not worsen since stopping.  Otalgia  There is pain in both ears. The current episode started 1 to 4 weeks ago. The problem occurs constantly. The problem has been unchanged. There has been no fever. The pain is at a severity of 3/10. Associated symptoms include coughing and headaches. Pertinent negatives include no diarrhea, ear discharge or hearing loss. She has tried NSAIDs and acetaminophen  (amoxicillin , flonase, and Claritin) for the symptoms. The treatment provided mild relief.      Review of Systems  HENT:  Positive for ear pain. Negative for ear discharge and hearing loss.   Respiratory:  Positive for cough.   Gastrointestinal:  Negative for diarrhea.  Neurological:  Positive for headaches.  All other systems reviewed and are negative.   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  Tobacco Use   Smoking status: Former    Current packs/day: 0.00    Average packs/day: 0.5 packs/day for 39.0 years (19.5 ttl pk-yrs)    Types: Cigarettes    Start date: 01/14/1968    Quit date: 2009    Years since quitting: 16.8   Smokeless tobacco: Never  Vaping Use    Vaping status: Never Used  Substance and Sexual Activity   Alcohol use: No   Drug use: No   Sexual activity: Not Currently  Other Topics Concern   Not on file  Social History Narrative   Lives home with husband - daughter in [place] here from [place]  in 2020   Social Drivers of Health   Financial Resource Strain: Low Risk  (10/06/2023)   Overall Financial Resource Strain (CARDIA)    Difficulty of Paying Living Expenses: Not hard at all  Food Insecurity: No Food Insecurity (10/06/2023)   Hunger Vital Sign    Worried About Running Out of Food in the Last Year: Never true    Ran Out of Food in the Last Year: Never true  Transportation Needs: No Transportation Needs (10/06/2023)   PRAPARE - Administrator, Civil Service (Medical): No    Lack of Transportation (Non-Medical): No  Physical Activity: Insufficiently Active (10/06/2023)   Exercise Vital Sign    Days of Exercise per Week: 3 days    Minutes of Exercise per Session: 20 min  Stress: Stress Concern Present (10/06/2023)   [employer] of Occupational Health - Occupational Stress Questionnaire    Feeling of Stress: To some extent  Social Connections: Socially Integrated (10/06/2023)   Social Connection and Isolation Panel    Frequency of Communication with Friends  and Family: More than three times a week    Frequency of Social Gatherings with Friends and Family: More than three times a week    Attends Religious Services: More than 4 times per year    Active Member of [employer] or Organizations: Yes    Attends Engineer, Structural: More than 4 times per year    Marital Status: Married   Family History  Problem Relation Age of Onset   Tremor Mother    Hyperlipidemia Mother    Skin cancer Mother    Dementia Mother    Congestive Heart Failure Father    Diabetes Father    Heart disease Father        congestive heart failure   Multiple sclerosis Sister    Heart disease Sister    Kidney  Stones Sister    Tremor Sister    Diabetes Paternal Grandfather    Hypertension Daughter    Anxiety disorder Daughter    ADD / ADHD Son    Colon cancer Neg Hx    Esophageal cancer Neg Hx    Stomach cancer Neg Hx    Rectal cancer Neg Hx    Inflammatory bowel disease Neg Hx    Liver disease Neg Hx    Pancreatic cancer Neg Hx    Sleep apnea Neg Hx         Objective:   Physical Exam Vitals reviewed.  Constitutional:      General: She is not in acute distress.    Appearance: She is well-developed.  HENT:     Head: Normocephalic and atraumatic.     Right Ear: A middle ear effusion is present. Tympanic membrane is not erythematous.     Left Ear: A middle ear effusion is present. Tympanic membrane is not erythematous.  Eyes:     Pupils: Pupils are equal, round, and reactive to light.  Neck:     Thyroid : No thyromegaly.  Cardiovascular:     Rate and Rhythm: Normal rate and regular rhythm.     Heart sounds: Normal heart sounds. No murmur heard. Pulmonary:     Effort: Pulmonary effort is normal. No respiratory distress.     Breath sounds: Normal breath sounds. No wheezing.  Abdominal:     General: Bowel sounds are normal. There is no distension.     Palpations: Abdomen is soft.     Tenderness: There is no abdominal tenderness.  Musculoskeletal:        General: No tenderness. Normal range of motion.     Cervical back: Normal range of motion and neck supple.  Skin:    General: Skin is warm and dry.  Neurological:     Mental Status: She is alert and oriented to person, place, and time.     Cranial Nerves: No cranial nerve deficit.     Deep Tendon Reflexes: Reflexes are normal and symmetric.  Psychiatric:        Behavior: Behavior normal.        Thought Content: Thought content normal.        Judgment: Judgment normal.       BP 120/78   Pulse 65   Temp (!) 97.4 F (36.3 C) (Temporal)   Ht 5' 1 (1.549 m)   Wt 120 lb 3.2 oz (54.5 kg)   BMI 22.71 kg/m       Assessment & Plan:  Neftaly  L [place] comes in today with chief complaint of Ear Pain (Both ears week and 1/2 went  to urgent care and they gave antibotic)   Diagnosis and orders addressed:  1. Eustachian tube disorder, bilateral (Primary) Continue flonase, increase to two sprays Continue Claritin  Start oral nasal decongestant OTC  Start prednisone   If no improvement will need ENT referral  Follow up if symptoms worsen or do not improve  - predniSONE  (DELTASONE ) 20 MG tablet; Take 2 tablets (40 mg total) by mouth daily with breakfast for 5 days. 2 po daily for 5 days  Dispense: 10 tablet; Refill: 0     Bari Learn, FNP

## 2023-12-16 ENCOUNTER — Other Ambulatory Visit: Payer: Self-pay

## 2023-12-16 DIAGNOSIS — R928 Other abnormal and inconclusive findings on diagnostic imaging of breast: Secondary | ICD-10-CM

## 2023-12-16 DIAGNOSIS — N6001 Solitary cyst of right breast: Secondary | ICD-10-CM

## 2024-01-19 ENCOUNTER — Ambulatory Visit (HOSPITAL_COMMUNITY)
Admission: RE | Admit: 2024-01-19 | Discharge: 2024-01-19 | Disposition: A | Discharge status: Home / Self Care | Source: Ambulatory Visit | Attending: Family Medicine | Admitting: Family Medicine

## 2024-01-19 DIAGNOSIS — R928 Other abnormal and inconclusive findings on diagnostic imaging of breast: Secondary | ICD-10-CM | POA: Insufficient documentation

## 2024-01-19 DIAGNOSIS — N6001 Solitary cyst of right breast: Secondary | ICD-10-CM | POA: Insufficient documentation

## 2024-07-14 ENCOUNTER — Encounter: Payer: Self-pay | Admitting: Family Medicine

## 2024-10-06 ENCOUNTER — Ambulatory Visit: Payer: Self-pay
